# Patient Record
Sex: Female | Born: 1955 | Race: White | Hispanic: No | Marital: Married | Smoking: Never smoker
Health system: Southern US, Community
[De-identification: ages and names within clinical notes are randomized; demographics above are authoritative.]

## PROBLEM LIST (undated history)

## (undated) DIAGNOSIS — K649 Unspecified hemorrhoids: Secondary | ICD-10-CM

## (undated) DIAGNOSIS — N189 Chronic kidney disease, unspecified: Secondary | ICD-10-CM

## (undated) HISTORY — DX: Unspecified hemorrhoids: K64.9

## (undated) HISTORY — PX: SALIVARY GLAND SURGERY: SHX768

## (undated) HISTORY — PX: TONSILLECTOMY AND ADENOIDECTOMY: SUR1326

## (undated) HISTORY — PX: ADENOIDECTOMY: SUR15

## (undated) HISTORY — DX: Chronic kidney disease, unspecified: N18.9

## (undated) HISTORY — PX: CHOLECYSTECTOMY: SHX55

## (undated) HISTORY — PX: LAPAROSCOPY: SHX197

---

## 2005-10-14 LAB — CONVERTED CEMR LAB
AST: 17 units/L
Albumin: 4.6 g/dL
Alkaline Phosphatase: 60 units/L
Anti Nuclear Antibody(ANA): NEGATIVE
BUN: 11 mg/dL
CO2: 25 meq/L
Calcium: 9.8 mg/dL
Chloride: 106 meq/L
Glucose, Bld: 90 mg/dL
HCT: 41.1 %
Hemoglobin: 13.9 g/dL
MCV: 92.6 fL
Platelets: 257 10*3/uL
Potassium: 5.2 meq/L
Rheumatoid fact SerPl-aCnc: 6 intl units/mL
Sodium: 141 meq/L
Total Bilirubin: 0.8 mg/dL
Total Protein: 7.2 g/dL
WBC, blood: 7.7 10*3/uL

## 2005-11-20 LAB — CONVERTED CEMR LAB: Pap Smear: NORMAL

## 2006-11-06 ENCOUNTER — Encounter: Payer: Self-pay | Admitting: Family Medicine

## 2006-11-07 ENCOUNTER — Ambulatory Visit: Payer: Self-pay | Admitting: Family Medicine

## 2006-11-07 DIAGNOSIS — M5412 Radiculopathy, cervical region: Secondary | ICD-10-CM | POA: Insufficient documentation

## 2006-11-12 ENCOUNTER — Encounter: Payer: Self-pay | Admitting: Family Medicine

## 2006-12-06 ENCOUNTER — Other Ambulatory Visit: Admission: RE | Admit: 2006-12-06 | Discharge: 2006-12-06 | Payer: Self-pay | Admitting: Family Medicine

## 2006-12-06 ENCOUNTER — Encounter: Payer: Self-pay | Admitting: Family Medicine

## 2006-12-06 ENCOUNTER — Ambulatory Visit: Payer: Self-pay | Admitting: Family Medicine

## 2006-12-11 ENCOUNTER — Ambulatory Visit: Payer: Self-pay | Admitting: Family Medicine

## 2006-12-11 DIAGNOSIS — D485 Neoplasm of uncertain behavior of skin: Secondary | ICD-10-CM

## 2006-12-12 ENCOUNTER — Telehealth (INDEPENDENT_AMBULATORY_CARE_PROVIDER_SITE_OTHER): Payer: Self-pay | Admitting: *Deleted

## 2006-12-12 ENCOUNTER — Encounter: Payer: Self-pay | Admitting: Family Medicine

## 2006-12-12 LAB — CONVERTED CEMR LAB
AST: 18 units/L (ref 0–37)
Albumin: 4.4 g/dL (ref 3.5–5.2)
Alkaline Phosphatase: 65 units/L (ref 39–117)
CO2: 26 meq/L (ref 19–32)
Chloride: 108 meq/L (ref 96–112)
Cholesterol: 186 mg/dL (ref 0–200)
Creatinine, Ser: 0.79 mg/dL (ref 0.40–1.20)
Glucose, Bld: 95 mg/dL (ref 70–99)
Potassium: 4.5 meq/L (ref 3.5–5.3)
Total Bilirubin: 0.4 mg/dL (ref 0.3–1.2)
Total CHOL/HDL Ratio: 2.2
Total Protein: 7.4 g/dL (ref 6.0–8.3)
Triglycerides: 36 mg/dL (ref <150)
VLDL: 7 mg/dL (ref 0–40)

## 2006-12-14 ENCOUNTER — Telehealth (INDEPENDENT_AMBULATORY_CARE_PROVIDER_SITE_OTHER): Payer: Self-pay | Admitting: *Deleted

## 2006-12-17 ENCOUNTER — Telehealth: Payer: Self-pay | Admitting: Family Medicine

## 2006-12-24 ENCOUNTER — Encounter: Payer: Self-pay | Admitting: Family Medicine

## 2007-02-02 ENCOUNTER — Encounter: Payer: Self-pay | Admitting: Family Medicine

## 2007-03-20 ENCOUNTER — Telehealth (INDEPENDENT_AMBULATORY_CARE_PROVIDER_SITE_OTHER): Payer: Self-pay | Admitting: *Deleted

## 2007-09-09 ENCOUNTER — Ambulatory Visit: Payer: Self-pay | Admitting: Family Medicine

## 2007-09-09 ENCOUNTER — Encounter: Admission: RE | Admit: 2007-09-09 | Discharge: 2007-09-09 | Payer: Self-pay | Admitting: Family Medicine

## 2007-09-09 DIAGNOSIS — B079 Viral wart, unspecified: Secondary | ICD-10-CM | POA: Insufficient documentation

## 2007-09-09 DIAGNOSIS — M4802 Spinal stenosis, cervical region: Secondary | ICD-10-CM | POA: Insufficient documentation

## 2007-09-18 ENCOUNTER — Encounter: Admission: RE | Admit: 2007-09-18 | Discharge: 2007-10-30 | Payer: Self-pay | Admitting: Family Medicine

## 2007-09-18 ENCOUNTER — Encounter: Payer: Self-pay | Admitting: Family Medicine

## 2007-11-12 ENCOUNTER — Ambulatory Visit: Payer: Self-pay | Admitting: Family Medicine

## 2007-11-12 DIAGNOSIS — J019 Acute sinusitis, unspecified: Secondary | ICD-10-CM

## 2007-11-12 DIAGNOSIS — J309 Allergic rhinitis, unspecified: Secondary | ICD-10-CM | POA: Insufficient documentation

## 2007-12-06 ENCOUNTER — Ambulatory Visit: Payer: Self-pay | Admitting: Family Medicine

## 2007-12-06 ENCOUNTER — Other Ambulatory Visit: Admission: RE | Admit: 2007-12-06 | Discharge: 2007-12-06 | Payer: Self-pay | Admitting: Family Medicine

## 2007-12-06 ENCOUNTER — Encounter: Admission: RE | Admit: 2007-12-06 | Discharge: 2007-12-06 | Payer: Self-pay | Admitting: Family Medicine

## 2007-12-06 ENCOUNTER — Encounter: Payer: Self-pay | Admitting: Family Medicine

## 2007-12-06 DIAGNOSIS — N951 Menopausal and female climacteric states: Secondary | ICD-10-CM

## 2007-12-06 DIAGNOSIS — R5381 Other malaise: Secondary | ICD-10-CM

## 2007-12-06 DIAGNOSIS — R5383 Other fatigue: Secondary | ICD-10-CM

## 2007-12-06 LAB — CONVERTED CEMR LAB
ALT: 14 units/L (ref 0–35)
AST: 19 units/L (ref 0–37)
Albumin: 4.5 g/dL (ref 3.5–5.2)
Alkaline Phosphatase: 61 units/L (ref 39–117)
BUN: 9 mg/dL (ref 6–23)
CO2: 27 meq/L (ref 19–32)
Calcium: 9.7 mg/dL (ref 8.4–10.5)
Chloride: 107 meq/L (ref 96–112)
Cholesterol: 184 mg/dL (ref 0–200)
Creatinine, Ser: 0.75 mg/dL (ref 0.40–1.20)
Glucose, Bld: 87 mg/dL (ref 70–99)
HCT: 44.3 % (ref 36.0–46.0)
HDL: 75 mg/dL (ref >39)
Hemoglobin: 14.3 g/dL (ref 12.0–15.0)
LDL Cholesterol: 97 mg/dL (ref 0–99)
MCHC: 32.3 g/dL (ref 30.0–36.0)
MCV: 95.9 fL (ref 78.0–100.0)
Platelets: 233 10*3/uL (ref 150–400)
Potassium: 5.3 meq/L (ref 3.5–5.3)
RBC: 4.62 M/uL (ref 3.87–5.11)
RDW: 13.2 % (ref 11.5–15.5)
Sodium: 144 meq/L (ref 135–145)
TSH: 2.309 microintl units/mL (ref 0.350–5.50)
Total Bilirubin: 0.5 mg/dL (ref 0.3–1.2)
Total CHOL/HDL Ratio: 2.5
Total Protein: 7.5 g/dL (ref 6.0–8.3)
Triglycerides: 59 mg/dL (ref <150)
VLDL: 12 mg/dL (ref 0–40)
WBC: 7.2 10*3/uL (ref 4.0–10.5)

## 2007-12-09 ENCOUNTER — Encounter: Payer: Self-pay | Admitting: Family Medicine

## 2007-12-10 ENCOUNTER — Encounter: Payer: Self-pay | Admitting: Family Medicine

## 2007-12-10 LAB — CONVERTED CEMR LAB: Pap Smear: NORMAL

## 2007-12-26 ENCOUNTER — Encounter: Admission: RE | Admit: 2007-12-26 | Discharge: 2007-12-26 | Payer: Self-pay | Admitting: Family Medicine

## 2008-01-09 ENCOUNTER — Ambulatory Visit: Payer: Self-pay | Admitting: Family Medicine

## 2008-01-09 DIAGNOSIS — D239 Other benign neoplasm of skin, unspecified: Secondary | ICD-10-CM | POA: Insufficient documentation

## 2008-01-10 ENCOUNTER — Encounter: Payer: Self-pay | Admitting: Family Medicine

## 2008-01-16 ENCOUNTER — Telehealth: Payer: Self-pay | Admitting: Family Medicine

## 2008-01-16 ENCOUNTER — Ambulatory Visit: Payer: Self-pay | Admitting: Family Medicine

## 2008-01-16 DIAGNOSIS — N3000 Acute cystitis without hematuria: Secondary | ICD-10-CM | POA: Insufficient documentation

## 2008-01-16 LAB — CONVERTED CEMR LAB
Bilirubin Urine: NEGATIVE
Blood in Urine, dipstick: NEGATIVE
Nitrite: NEGATIVE
Protein, U semiquant: 30
Specific Gravity, Urine: 1.015

## 2008-01-17 ENCOUNTER — Encounter: Payer: Self-pay | Admitting: Family Medicine

## 2008-01-23 ENCOUNTER — Ambulatory Visit: Payer: Self-pay | Admitting: Family Medicine

## 2008-01-23 LAB — CONVERTED CEMR LAB
Bilirubin Urine: NEGATIVE
Blood in Urine, dipstick: NEGATIVE
Glucose, Urine, Semiquant: NEGATIVE
Ketones, urine, test strip: NEGATIVE
Nitrite: NEGATIVE
Protein, U semiquant: NEGATIVE
Specific Gravity, Urine: <1.005
Urobilinogen, UA: 0.2
WBC Urine, dipstick: NEGATIVE
pH: 6.5

## 2008-07-24 ENCOUNTER — Ambulatory Visit: Payer: Self-pay | Admitting: Family Medicine

## 2008-07-24 DIAGNOSIS — K115 Sialolithiasis: Secondary | ICD-10-CM | POA: Insufficient documentation

## 2008-08-11 ENCOUNTER — Encounter: Payer: Self-pay | Admitting: Family Medicine

## 2008-08-12 ENCOUNTER — Ambulatory Visit: Payer: Self-pay | Admitting: Family Medicine

## 2008-08-12 DIAGNOSIS — L821 Other seborrheic keratosis: Secondary | ICD-10-CM

## 2008-10-27 ENCOUNTER — Ambulatory Visit: Payer: Self-pay | Admitting: Family Medicine

## 2008-12-11 ENCOUNTER — Ambulatory Visit: Payer: Self-pay | Admitting: Family Medicine

## 2008-12-11 ENCOUNTER — Encounter: Payer: Self-pay | Admitting: Family Medicine

## 2008-12-11 ENCOUNTER — Other Ambulatory Visit: Admission: RE | Admit: 2008-12-11 | Discharge: 2008-12-11 | Payer: Self-pay | Admitting: Family Medicine

## 2008-12-14 ENCOUNTER — Encounter: Payer: Self-pay | Admitting: Family Medicine

## 2008-12-14 LAB — CONVERTED CEMR LAB
ALT: 20 units/L (ref 0–35)
AST: 20 units/L (ref 0–37)
Albumin: 4.4 g/dL (ref 3.5–5.2)
BUN: 13 mg/dL (ref 6–23)
Basophils Absolute: 0.1 10*3/uL (ref 0.0–0.1)
Basophils Relative: 1 % (ref 0–1)
CO2: 26 meq/L (ref 19–32)
Cholesterol: 167 mg/dL (ref 0–200)
Creatinine, Ser: 0.69 mg/dL (ref 0.40–1.20)
Eosinophils Absolute: 0.3 10*3/uL (ref 0.0–0.7)
Eosinophils Relative: 3 % (ref 0–5)
Glucose, Bld: 96 mg/dL (ref 70–99)
HCT: 42.5 % (ref 36.0–46.0)
HDL: 72 mg/dL (ref >39)
Hemoglobin: 14.2 g/dL (ref 12.0–15.0)
LDL Cholesterol: 83 mg/dL (ref 0–99)
Lymphocytes Relative: 24 % (ref 12–46)
Lymphs Abs: 2 10*3/uL (ref 0.7–4.0)
MCHC: 33.4 g/dL (ref 30.0–36.0)
MCV: 93.2 fL (ref 78.0–100.0)
Monocytes Absolute: 0.7 10*3/uL (ref 0.1–1.0)
Monocytes Relative: 8 % (ref 3–12)
Neutro Abs: 5.1 10*3/uL (ref 1.7–7.7)
Neutrophils Relative %: 63 % (ref 43–77)
Platelets: 266 10*3/uL (ref 150–400)
RBC: 4.56 M/uL (ref 3.87–5.11)
RDW: 13.5 % (ref 11.5–15.5)
Sodium: 143 meq/L (ref 135–145)
TSH: 1.671 microintl units/mL (ref 0.350–4.500)
Total Bilirubin: 0.5 mg/dL (ref 0.3–1.2)
Total CHOL/HDL Ratio: 2.3
Total Protein: 7.2 g/dL (ref 6.0–8.3)
VLDL: 12 mg/dL (ref 0–40)
WBC: 8.1 10*3/uL (ref 4.0–10.5)

## 2009-01-25 ENCOUNTER — Encounter: Admission: RE | Admit: 2009-01-25 | Discharge: 2009-01-25 | Payer: Self-pay | Admitting: Family Medicine

## 2009-03-31 ENCOUNTER — Ambulatory Visit: Payer: Self-pay | Admitting: Family Medicine

## 2009-03-31 DIAGNOSIS — L570 Actinic keratosis: Secondary | ICD-10-CM

## 2009-04-18 ENCOUNTER — Encounter: Payer: Self-pay | Admitting: Family Medicine

## 2009-04-21 ENCOUNTER — Encounter: Payer: Self-pay | Admitting: Family Medicine

## 2009-04-28 ENCOUNTER — Encounter: Payer: Self-pay | Admitting: Family Medicine

## 2009-05-19 ENCOUNTER — Encounter: Admission: RE | Admit: 2009-05-19 | Discharge: 2009-05-19 | Payer: Self-pay

## 2009-07-13 ENCOUNTER — Ambulatory Visit: Payer: Self-pay | Admitting: Family Medicine

## 2009-07-13 DIAGNOSIS — H9319 Tinnitus, unspecified ear: Secondary | ICD-10-CM | POA: Insufficient documentation

## 2009-07-13 DIAGNOSIS — H9209 Otalgia, unspecified ear: Secondary | ICD-10-CM | POA: Insufficient documentation

## 2009-07-22 ENCOUNTER — Encounter: Payer: Self-pay | Admitting: Family Medicine

## 2009-09-28 ENCOUNTER — Ambulatory Visit: Payer: Self-pay | Admitting: Family Medicine

## 2009-09-28 DIAGNOSIS — M2550 Pain in unspecified joint: Secondary | ICD-10-CM | POA: Insufficient documentation

## 2009-09-28 LAB — CONVERTED CEMR LAB
Bilirubin Urine: NEGATIVE
Blood in Urine, dipstick: NEGATIVE
Glucose, Urine, Semiquant: NEGATIVE
Heterophile Ab Screen: NEGATIVE
Inflenza A Ag: NEGATIVE
Influenza B Ag: NEGATIVE
Nitrite: NEGATIVE
Specific Gravity, Urine: 1.015
Urobilinogen, UA: 0.2
WBC Urine, dipstick: NEGATIVE
pH: 7

## 2009-09-29 ENCOUNTER — Ambulatory Visit: Payer: Self-pay | Admitting: Family Medicine

## 2009-09-29 ENCOUNTER — Encounter (INDEPENDENT_AMBULATORY_CARE_PROVIDER_SITE_OTHER): Payer: Self-pay | Admitting: *Deleted

## 2009-09-29 ENCOUNTER — Encounter: Admission: RE | Admit: 2009-09-29 | Discharge: 2009-09-29 | Payer: Self-pay | Admitting: Family Medicine

## 2009-09-29 LAB — CONVERTED CEMR LAB
Basophils Absolute: 0.1 10*3/uL (ref 0.0–0.1)
Basophils Relative: 0 % (ref 0–1)
Eosinophils Absolute: 1.1 10*3/uL — ABNORMAL HIGH (ref 0.0–0.7)
Eosinophils Relative: 7 % — ABNORMAL HIGH (ref 0–5)
Glucose, Urine, Semiquant: NEGATIVE
HCT: 41.7 % (ref 36.0–46.0)
Hemoglobin: 13.6 g/dL (ref 12.0–15.0)
Ketones, urine, test strip: NEGATIVE
Lymphocytes Relative: 11 % — ABNORMAL LOW (ref 12–46)
Lymphs Abs: 1.6 10*3/uL (ref 0.7–4.0)
MCHC: 32.6 g/dL (ref 30.0–36.0)
MCV: 94.6 fL (ref 78.0–100.0)
Monocytes Absolute: 0.8 10*3/uL (ref 0.1–1.0)
Monocytes Relative: 6 % (ref 3–12)
Neutro Abs: 11.1 10*3/uL — ABNORMAL HIGH (ref 1.7–7.7)
Neutrophils Relative %: 76 % (ref 43–77)
Nitrite: NEGATIVE
Platelets: 304 10*3/uL (ref 150–400)
Protein, U semiquant: NEGATIVE
RBC: 4.41 M/uL (ref 3.87–5.11)
RDW: 13.5 % (ref 11.5–15.5)
Sed Rate: 16 mm/hr (ref 0–22)
Specific Gravity, Urine: 1.015
Urobilinogen, UA: 0.2
WBC: 14.6 10*3/uL — ABNORMAL HIGH (ref 4.0–10.5)
pH: 7

## 2009-09-30 ENCOUNTER — Telehealth: Payer: Self-pay | Admitting: Family Medicine

## 2009-10-22 ENCOUNTER — Telehealth: Payer: Self-pay | Admitting: Family Medicine

## 2009-10-22 DIAGNOSIS — M79609 Pain in unspecified limb: Secondary | ICD-10-CM | POA: Insufficient documentation

## 2009-12-02 ENCOUNTER — Ambulatory Visit: Payer: Self-pay | Admitting: Family Medicine

## 2009-12-15 ENCOUNTER — Ambulatory Visit: Payer: Self-pay | Admitting: Family Medicine

## 2009-12-15 DIAGNOSIS — N959 Unspecified menopausal and perimenopausal disorder: Secondary | ICD-10-CM | POA: Insufficient documentation

## 2009-12-15 DIAGNOSIS — R143 Flatulence: Secondary | ICD-10-CM

## 2009-12-15 DIAGNOSIS — R142 Eructation: Secondary | ICD-10-CM

## 2009-12-15 DIAGNOSIS — R141 Gas pain: Secondary | ICD-10-CM

## 2009-12-15 LAB — CONVERTED CEMR LAB
ALT: 21 units/L (ref 0–35)
AST: 22 units/L (ref 0–37)
Alkaline Phosphatase: 65 units/L (ref 39–117)
BUN: 14 mg/dL (ref 6–23)
Basophils Absolute: 0.1 10*3/uL (ref 0.0–0.1)
Basophils Relative: 1 % (ref 0–1)
CO2: 26 meq/L (ref 19–32)
Chloride: 106 meq/L (ref 96–112)
Cholesterol: 186 mg/dL (ref 0–200)
Creatinine, Ser: 0.75 mg/dL (ref 0.40–1.20)
Eosinophils Absolute: 0.1 10*3/uL (ref 0.0–0.7)
Eosinophils Relative: 2 % (ref 0–5)
Glucose, Bld: 85 mg/dL (ref 70–99)
HCT: 43.3 % (ref 36.0–46.0)
HDL: 76 mg/dL (ref >39)
LDL Cholesterol: 97 mg/dL (ref 0–99)
Lymphs Abs: 1.7 10*3/uL (ref 0.7–4.0)
MCHC: 32.3 g/dL (ref 30.0–36.0)
MCV: 93.5 fL (ref 78.0–100.0)
Monocytes Relative: 7 % (ref 3–12)
Neutrophils Relative %: 69 % (ref 43–77)
Platelets: 290 10*3/uL (ref 150–400)
Potassium: 4.4 meq/L (ref 3.5–5.3)
Progesterone: <0.2 ng/mL
RBC: 4.63 M/uL (ref 3.87–5.11)
Sodium: 142 meq/L (ref 135–145)
TSH: 2.233 microintl units/mL (ref 0.350–4.500)
Tissue Transglutaminase Ab, IgA: 3.6 units (ref <20)
Total Bilirubin: 0.6 mg/dL (ref 0.3–1.2)
Total CHOL/HDL Ratio: 2.4
Triglycerides: 67 mg/dL (ref <150)
VLDL: 13 mg/dL (ref 0–40)
WBC: 7.7 10*3/uL (ref 4.0–10.5)

## 2009-12-21 ENCOUNTER — Telehealth: Payer: Self-pay | Admitting: Family Medicine

## 2010-03-21 ENCOUNTER — Ambulatory Visit: Payer: Self-pay | Admitting: Family Medicine

## 2010-03-21 DIAGNOSIS — R300 Dysuria: Secondary | ICD-10-CM

## 2010-03-21 LAB — CONVERTED CEMR LAB
Bilirubin Urine: NEGATIVE
Blood in Urine, dipstick: NEGATIVE
Glucose, Urine, Semiquant: NEGATIVE
Nitrite: NEGATIVE
Protein, U semiquant: NEGATIVE
Specific Gravity, Urine: 1.015
Urobilinogen, UA: 0.2
WBC Urine, dipstick: NEGATIVE
pH: 7.5

## 2010-03-22 ENCOUNTER — Encounter: Payer: Self-pay | Admitting: Family Medicine

## 2010-03-28 ENCOUNTER — Telehealth: Payer: Self-pay | Admitting: Family Medicine

## 2010-04-20 ENCOUNTER — Ambulatory Visit: Payer: Self-pay | Admitting: Family Medicine

## 2010-04-20 DIAGNOSIS — R350 Frequency of micturition: Secondary | ICD-10-CM | POA: Insufficient documentation

## 2010-04-20 LAB — CONVERTED CEMR LAB
Bilirubin Urine: NEGATIVE
Blood in Urine, dipstick: NEGATIVE
Glucose, Urine, Semiquant: NEGATIVE
Ketones, urine, test strip: NEGATIVE
Nitrite: NEGATIVE
Protein, U semiquant: NEGATIVE
Specific Gravity, Urine: 1.01
Urobilinogen, UA: 0.2
WBC Urine, dipstick: NEGATIVE
pH: 5.5

## 2010-04-21 ENCOUNTER — Encounter (INDEPENDENT_AMBULATORY_CARE_PROVIDER_SITE_OTHER): Payer: Self-pay | Admitting: *Deleted

## 2010-04-22 ENCOUNTER — Encounter: Payer: Self-pay | Admitting: Family Medicine

## 2010-04-22 LAB — CONVERTED CEMR LAB
Casts: NONE SEEN /lpf
Crystals: NONE SEEN
RBC / HPF: NONE SEEN (ref <3)

## 2010-05-09 ENCOUNTER — Encounter: Payer: Self-pay | Admitting: Family Medicine

## 2010-05-23 ENCOUNTER — Encounter: Admission: RE | Admit: 2010-05-23 | Discharge: 2010-05-23 | Payer: Self-pay | Admitting: Family Medicine

## 2010-07-21 HISTORY — PX: BUNIONECTOMY: SHX129

## 2010-09-20 NOTE — Assessment & Plan Note (Signed)
Summary: CPE w/o pap   Vital Signs:  Patient profile:   55 year old female Menstrual status:  postmenopausal Height:      67.5 inches Weight:      143 pounds BMI:     22.15 O2 Sat:      100 % on Room air Pulse rate:   74 / minute BP sitting:   105 / 74  (left arm) Cuff size:   regular  Vitals Entered By: Payton Spark CMA (December 15, 2009 8:46 AM)  O2 Flow:  Room air CC: CPE w/ out pap   Primary Care Provider:  Seymour Bars DO  CC:  CPE w/ out pap.  History of Present Illness: 55 yo WF presents for CPE.  She had a normal pap last year.  She has been in menopause since 06.  She has just a few vasomotor flushes.  She is on Progesterone with DHEA thru [employer].  It has really helped her energy level.    She had been doing allergy shots but stopped them and she is still coughing.  She is using Zyrtec and Flonase but they do not seem to be helping.  She denies any vag bleeding, discharge or voiding concerns.    She had a normal colonoscopy in 06.  She had a Tdap in 05.  She is due for fasting labs.  Mammogram is due in June.      Current Medications (verified): 1)  Calcium-Magnesium 500-250 Mg Tabs (Calcium-Magnesium) .... Take 2 Tablet By Mouth Once A Day 2)  Flonase 50 Mcg/act Susp (Fluticasone Propionate) .... Two Sprays/nostril Daily 3)  Pro-Biotic Blend  Caps (Misc Intestinal Flora Regulat) .... Take 1 Tablet By Mouth Once A Day 4)  Allegra 180 Mg Tabs (Fexofenadine Hcl) .Marland Kitchen.. 1 Tab By Mouth Daily 5)  5-Htp Tryptophan 50 Mg Tabs (Nutritional Supplements) .... Take By Mouth Daily 6)  Super B-50 Complex Plus  Caps (Vitamins-Lipotropics) 7)  Potassium 99 Mg Tabs (Potassium) 8)  Cranberry 500 Mg Caps (Cranberry) 9)  Astragalus Root  Powd (Tragacanth)  Allergies (verified): 1)  ! Codeine 2)  ! Augmentin 3)  ! Adhesive Tape 4)  ! * Mold  Past History:  Past Medical History: Reviewed history from 12/11/2008 and no changes required. 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 D&C 2006 cholecystectomy 1998 salivary gland 1996, R side bunionectomy 1974  Family History: Reviewed history from 11/07/2006 and no changes required. mother died CLL, HTN, thyroid father thyroid, living. brother healthy  Social History: Reviewed history from 12/11/2008 and no changes required. Working at [employer].  Married to [place] and has 4 kids.  Lives with husband and 64 yo son Gala Romney).  Has twin girls.  Menopausal since 2006.  Never smoked.  1 ETOH per wk.  Yoga and walking 3-4 x a wk.  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  Review of Systems  The patient denies anorexia, fever, weight loss, weight gain, vision loss, decreased hearing, hoarseness, chest pain, syncope, dyspnea on exertion, peripheral edema, prolonged cough, headaches, hemoptysis, abdominal pain, melena, hematochezia, severe indigestion/heartburn, hematuria, incontinence, genital sores, muscle weakness, suspicious skin lesions, transient blindness, difficulty walking, depression, unusual weight change, abnormal bleeding, enlarged lymph nodes, angioedema, breast masses, and testicular masses.    Physical Exam  General:  alert, well-developed, well-nourished, and well-hydrated.   Head:  normocephalic and atraumatic.  Eyes:  pupils equal, pupils round, and pupils reactive to light.   Ears:  EACs patent; TMs translucent and gray with good cone of light and bony landmarks.  Nose:  no nasal discharge.   Mouth:  good dentition and pharynx pink and moist.  post nasal drip Neck:  no masses.   Breasts:  No mass, nodules, thickening, tenderness, bulging, retraction, inflamation, nipple discharge or skin changes noted.   Lungs:  Normal respiratory effort, chest expands symmetrically. Lungs are clear to auscultation, no crackles or wheezes. Heart:  Normal rate and  regular rhythm. S1 and S2 normal without gallop, murmur, click, rub or other extra sounds. Abdomen:  Bowel sounds positive,abdomen soft and non-tender without masses, organomegaly Pulses:  2+ radial and pedal pulses Extremities:  no E/C/C Neurologic:  gait normal and DTRs symmetrical and normal.   Skin:  color normal.  warty lesion over R index finger PIP joint Cervical Nodes:  No lymphadenopathy noted Psych:  good eye contact, not anxious appearing, and not depressed appearing.     Impression & Recommendations:  Problem # 1:  HEALTH MAINTENANCE EXAM (ICD-V70.0) Keeping healthy checklist for women reviewed. BP and BMI at goal Keep up the good work with healthy diet, exercise, MVI and calc/ D daily. DEXA and pap due next year. Tdap and colonosocpy UTD. Mammo due this summer. Update fasting labs today.  Complete Medication List: 1)  Calcium-magnesium 500-250 Mg Tabs (Calcium-magnesium) .... Take 2 tablet by mouth once a day 2)  Flonase 50 Mcg/act Susp (Fluticasone propionate) .... Two sprays/nostril daily 3)  Pro-biotic Blend Caps (Misc intestinal flora regulat) .... Take 1 tablet by mouth once a day 4)  Allegra 180 Mg Tabs (Fexofenadine hcl) .Marland Kitchen.. 1 tab by mouth daily 5)  5-htp Tryptophan 50 Mg Tabs (Nutritional supplements) .... Take by mouth daily 6)  Super B-50 Complex Plus Caps (Vitamins-lipotropics) 7)  Potassium 99 Mg Tabs (Potassium) 8)  Cranberry 500 Mg Caps (Cranberry) 9)  Astragalus Root Powd (Tragacanth) 10)  Omnaris 50 Mcg/act Susp (Ciclesonide) .... 2 sprays/ nostril daily  Other Orders: T-CBC w/Diff ([id number]) T-Comprehensive Metabolic Panel [id number]) T-Lipid Profile ([id number]) T-TSH [id number]) T-Progesterone (28413) T-Celiac Disease Ab Evaluation (8002)  Patient Instructions: 1)  Fasting labs today. 2)  will call you w/ results on Friday. 3)  Return for follow up in 4 months and will plan to schedule bunion surgery for the  Winter. Prescriptions: OMNARIS 50 MCG/ACT SUSP (CICLESONIDE) 2 sprays/ nostril daily  #1 x 2   Entered and Authorized by:   Seymour Bars DO   Signed by:   Seymour Bars DO on 12/15/2009   Method used:   Electronically to        [facility]  [id number] (retail)       [address]       [place], [place]  [address]       Ph: [phone]       Fax: [phone]   RxID:   [id number]

## 2010-09-20 NOTE — Progress Notes (Signed)
Summary: drug plan requires trying preferred drug first   Phone Note Outgoing Call   Call placed by: marj Call placed to: [facility] Summary of Call: Omnaris denied- Health plan requires she try fluticasone flunisolide or nasonex first  Initial call taken by: Roselle Locus,  Dec 21, 2009 2:17 PM    New/Updated Medications: NASONEX 50 MCG/ACT SUSP (MOMETASONE FUROATE) 2 sprays per nostril daily Prescriptions: NASONEX 50 MCG/ACT SUSP (MOMETASONE FUROATE) 2 sprays per nostril daily  #1 bottle x 1   Entered and Authorized by:   Seymour Bars DO   Signed by:   Seymour Bars DO on 12/21/2009   Method used:   Electronically to        [facility]  [id number] (retail)       [address]       [place], [place]  [address]       Ph: [phone]       Fax: [phone]   RxID:   [id number]

## 2010-09-20 NOTE — Consult Note (Signed)
Summary: [facility]  [facility]   Imported By: Lanelle Bal 05/18/2010 14:09:33  _____________________________________________________________________  External Attachment:    Type:   Image     Comment:   External Document

## 2010-09-20 NOTE — Progress Notes (Signed)
Summary: ?Cyst/referral  Phone Note Call from Patient   Caller: Patient  Summary of Call: Pt states she has developed a ? cyst on the top of her index finder x 2 weeks. She states that it is not painful but bothers her. Pt requests going to same hand surgeon as husband bc her ? cyst is very similar to the one her husband had. Please advise.  Initial call taken by: Payton Spark CMA,  October 22, 2009 9:20 AM  Follow-up for Phone Call        put in.   Follow-up by: Seymour Bars DO,  October 22, 2009 9:25 AM  New Problems: FINGER PAIN (ICD-729.5)   New Problems: FINGER PAIN (ICD-729.5)

## 2010-09-20 NOTE — Letter (Signed)
Summary: Primary Care Consult Scheduled Letter  [facility]  [address], [address]   [place], [place] [address]   Phone: [phone]  Fax: [phone]      04/21/2010 MRN: [id number]  ALAZAY LEICHT [address] [place], [place]  [address]    Dear Ms. Suto,     We have scheduled an appointment for you.  At the recommendation of Dr.Bowen, we have scheduled you a consult with [facility]imb[facility] on 05/09/10 at 8:45.  Their address is [address] [place] [address] ( [address] ). The office phone number is [phone].  If this appointment day and time is not convenient for you, please feel free to call the office of the doctor you are being referred to at the number listed above and reschedule the appointment.     It is important for you to keep your scheduled appointments. We are here to make sure you are given good patient care.    Thank you, Victorino Dike [phone], press 1 Patient Care Coordinator [facility]

## 2010-09-20 NOTE — Letter (Signed)
Summary: Out of Work  [facility]  [address], [address]   [place], [place] [address]   Phone: [phone]  Fax: [phone]    September 28, 2009   Employee:  PAM VANALSTINE    To Whom It May Concern:   For Medical reasons, please excuse the above named employee from work for the following dates:  Start:   Feb 8th - 9th  End:   Feb 10th  If you need additional information, please feel free to contact our office.         Sincerely,    Seymour Bars DO

## 2010-09-20 NOTE — Assessment & Plan Note (Signed)
Summary: sinusitis   Vital Signs:  Patient profile:   55 year old female Menstrual status:  postmenopausal Height:      67.5 inches Weight:      143 pounds BMI:     22.15 O2 Sat:      98 % on Room air Temp:     99.3 degrees F oral Pulse rate:   74 / minute BP sitting:   120 / 84  (left arm) Cuff size:   regular  Vitals Entered By: Payton Spark CMA (December 02, 2009 2:19 PM)  O2 Flow:  Room air CC: Sinus congestion, ear ache and cough x 1.5 weeks.    Primary Care Provider:  Seymour Bars DO  CC:  Sinus congestion and ear ache and cough x 1.5 weeks. Marland Kitchen  History of Present Illness: 55 yo WF presents for feeling congested and chills 10 days ago.  She started to feel better until last night and started having sinus HA this AM.  She has a R maxillary and frontal HA.  She is taking zyrtec or allegra for allergies and flonase but it has not relieved her.  She is doing Mucinex and Delsym which helped a little bit.  She has not taken much today.    She has some rhinorrhea.  She has a hx of seasonal allergies.  She is off allergy shots since December b/c it wasn't helping prevent sinusitis for her.  She has about 3 sinusitis / year.      Current Medications (verified): 1)  Calcium-Magnesium 500-250 Mg Tabs (Calcium-Magnesium) .... Take 2 Tablet By Mouth Once A Day 2)  Flonase 50 Mcg/act Susp (Fluticasone Propionate) .... Two Sprays/nostril Daily 3)  Pro-Biotic Blend  Caps (Misc Intestinal Flora Regulat) .... Take 1 Tablet By Mouth Once A Day  Allergies (verified): 1)  ! Codeine 2)  ! Augmentin 3)  ! Adhesive Tape 4)  ! * Mold  Past History:  Past Medical History: Reviewed history from 12/11/2008 and no changes required. 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 Working at [employer].  Married to [place] and has 4 kids.  Lives with  husband and 61 yo son Gala Romney).  Has twin girls.  Menopausal since 2006.  Never smoked.  1 ETOH per wk.  Yoga and walking 3-4 x a wk.  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  Review of Systems      See HPI  Physical Exam  General:  alert, well-developed, well-nourished, and well-hydrated.   Head:  normocephalic and atraumatic.   R maxillary sinus TTP Eyes:  conjunctiva clear Ears:  EACs patent; TMs translucent and gray with good cone of light and bony landmarks.  Nose:  nasal congestion with clear rhinorrhea Mouth:  good dentition and pharynx pink and moist.   Neck:  no masses.   Lungs:  Normal respiratory effort, chest expands symmetrically. Lungs are clear to auscultation, no crackles or wheezes. dry cough Heart:  Normal rate and regular rhythm. S1 and S2 normal without gallop, murmur, click, rub or other extra sounds. Skin:  color normal.   Cervical Nodes:  No lymphadenopathy noted Psych:  good eye contact.     Impression & Recommendations:  Problem # 1:  SINUSITIS- ACUTE-NOS (ICD-461.9) Treat sinusitis with Clarithromycin, Advil Cold and Sinus daytime and Nyquil at night.  Call if not improved in 10 days. The following medications were  removed from the medication list:    Vantin 100 Mg Tabs (Cefpodoxime proxetil) .Marland Kitchen... 1 tab by mouth two times a day x 10 days Her updated medication list for this problem includes:    Flonase 50 Mcg/act Susp (Fluticasone propionate) .Marland Kitchen..Marland Kitchen Two sprays/nostril daily    Clarithromycin 500 Mg Tabs (Clarithromycin) .Marland Kitchen... 1 tab by mouth two times a day x 10 days  Problem # 2:  ALLERGIC RHINITIS (ICD-477.9)  Her updated medication list for this problem includes:    Flonase 50 Mcg/act Susp (Fluticasone propionate) .Marland Kitchen..Marland Kitchen Two sprays/nostril daily    Allegra 180 Mg Tabs (Fexofenadine hcl) .Marland Kitchen... 1 tab by mouth daily  Complete Medication List: 1)  Calcium-magnesium 500-250 Mg Tabs (Calcium-magnesium) .... Take 2 tablet by mouth once a day 2)  Flonase 50 Mcg/act  Susp (Fluticasone propionate) .... Two sprays/nostril daily 3)  Pro-biotic Blend Caps (Misc intestinal flora regulat) .... Take 1 tablet by mouth once a day 4)  Clarithromycin 500 Mg Tabs (Clarithromycin) .Marland Kitchen.. 1 tab by mouth two times a day x 10 days 5)  Allegra 180 Mg Tabs (Fexofenadine hcl) .Marland Kitchen.. 1 tab by mouth daily  Patient Instructions: 1)  For sinusitis, use Clarithromycin x 10 days. 2)  Take Allegra + Flonase for allergies thru Spring. 3)  Use Advil Cold and Sinus during the day and Nyquil at night for current symptoms. 4)  Call if not improved in 10 days. Prescriptions: FLONASE 50 MCG/ACT SUSP (FLUTICASONE PROPIONATE) two sprays/nostril daily  #1 x 2   Entered and Authorized by:   Seymour Bars DO   Signed by:   Seymour Bars DO on 12/02/2009   Method used:   Electronically to        [facility]  [id number] (retail)       [address]       [place], [place]  [address]       Ph: [phone]       Fax: [phone]   RxID:   [id number] CLARITHROMYCIN 500 MG TABS (CLARITHROMYCIN) 1 tab by mouth two times a day x 10 days  #20 x 0   Entered and Authorized by:   Seymour Bars DO   Signed by:   Seymour Bars DO on 12/02/2009   Method used:   Electronically to        [facility]  [id number] (retail)       [address]       [place], [place]  [address]       Ph: [phone]       Fax: [phone]   RxID:   [id number]

## 2010-09-20 NOTE — Progress Notes (Signed)
Summary: UTI  Phone Note Call from Patient   Caller: Patient Summary of Call: Pt states she started Keflex on Thursday and feels no better. Pt would like to know if she can use AZO w/ Keflex to help w/ burning. Please adivse. Initial call taken by: Payton Spark CMA,  March 28, 2010 10:01 AM  Follow-up for Phone Call        her culture was sensitive to Keflex, so this should work.  She can take AZO x 48 hrs.  If not resolved, pls call. Follow-up by: Seymour Bars DO,  March 28, 2010 10:10 AM     Appended Document: UTI Pt aware of the above

## 2010-09-20 NOTE — Assessment & Plan Note (Signed)
Summary: UA//mpm  Nurse Visit   Vitals Entered By: Payton Spark CMA (September 29, 2009 11:41 AM)  Allergies: 1)  ! Codeine 2)  ! Augmentin 3)  ! Adhesive Tape 4)  ! * Mold Laboratory Results   Urine Tests    Routine Urinalysis   Color: yellow Appearance: Hazy Glucose: negative   (Normal Range: Negative) Bilirubin: negative   (Normal Range: Negative) Ketone: negative   (Normal Range: Negative) Spec. Gravity: 1.015   (Normal Range: 1.003-1.035) Blood: trace-intact   (Normal Range: Negative) pH: 7.0   (Normal Range: 5.0-8.0) Protein: negative   (Normal Range: Negative) Urobilinogen: 0.2   (Normal Range: 0-1) Nitrite: negative   (Normal Range: Negative) Leukocyte Esterace: trace   (Normal Range: Negative)       Orders Added: 1)  UA Dipstick w/o Micro (automated)  [81003]

## 2010-09-20 NOTE — Progress Notes (Signed)
Summary: Sinus infection  Phone Note Call from Patient   Caller: Patient Summary of Call: Pt calls stating she has a sinus infection. She gets them often and she an allergist occ for them. She c/o facial pressure and eye strain. Please advise. Initial call taken by: Payton Spark CMA,  September 30, 2009 12:30 PM  Follow-up for Phone Call        With her WBC count being up I will go ahead and put her on a round of antibiotics. Follow-up by: Seymour Bars DO,  September 30, 2009 12:32 PM    New/Updated Medications: VANTIN 100 MG TABS (CEFPODOXIME PROXETIL) 1 tab by mouth two times a day x 10 days Prescriptions: VANTIN 100 MG TABS (CEFPODOXIME PROXETIL) 1 tab by mouth two times a day x 10 days  #20 x 0   Entered and Authorized by:   Seymour Bars DO   Signed by:   Seymour Bars DO on 09/30/2009   Method used:   Electronically to        [facility]  [id number] (retail)       [address]       [place], [place]  [address]       Ph: [phone]       Fax: [phone]   RxID:   [id number]   Appended Document: Sinus infection Pt aware

## 2010-09-20 NOTE — Letter (Signed)
Summary: Out of Work  [facility] [place] [facility]  [address]   [place], [place] [address]   Phone: [phone]  Fax: [phone]    September 29, 2009   Employee:  Lynn Lloyd    To Whom It May Concern:   For Medical reasons, please excuse the above named employee from work for the following dates:  Start:   09/29/09  End:   09/30/09  If you need additional information, please feel free to contact our office.         Sincerely,   Nani Gasser MD

## 2010-09-20 NOTE — Assessment & Plan Note (Signed)
Summary: urine check  Nurse Visit   Vital Signs:  Patient profile:   55 year old female Menstrual status:  postmenopausal Temp:     98.7 degrees F oral  Vitals Entered By: Payton Spark CMA (March 21, 2010 4:41 PM)  Allergies: 1)  ! Codeine 2)  ! Augmentin 3)  ! Adhesive Tape 4)  ! * Mold Laboratory Results   Urine Tests    Routine Urinalysis   Color: yellow Appearance: Clear Glucose: negative   (Normal Range: Negative) Bilirubin: negative   (Normal Range: Negative) Ketone: negative   (Normal Range: Negative) Spec. Gravity: 1.015   (Normal Range: 1.003-1.035) Blood: negative   (Normal Range: Negative) pH: 7.5   (Normal Range: 5.0-8.0) Protein: negative   (Normal Range: Negative) Urobilinogen: 0.2   (Normal Range: 0-1) Nitrite: negative   (Normal Range: Negative) Leukocyte Esterace: negative   (Normal Range: Negative)       Orders Added: 1)  UA Dipstick w/o Micro (automated)  [81003] 2)  T-Culture, Urine [[id number]]  Pt c/o frequency, urgency and burning w/ urination x 2 days.   Impression & Recommendations:  Problem # 1:  DYSURIA (ICD-788.1) UA is normal.  Will send for cx.  No abx unless cx comes back +. Orders: UA Dipstick w/o Micro (automated)  (81003) T-Culture, Urine ([id number])  Complete Medication List: 1)  Calcium-magnesium 500-250 Mg Tabs (Calcium-magnesium) .... Take 2 tablet by mouth once a day 2)  Flonase 50 Mcg/act Susp (Fluticasone propionate) .... Two sprays/nostril daily 3)  Pro-biotic Blend Caps (Misc intestinal flora regulat) .... Take 1 tablet by mouth once a day 4)  Allegra 180 Mg Tabs (Fexofenadine hcl) .Marland Kitchen.. 1 tab by mouth daily 5)  5-htp Tryptophan 50 Mg Tabs (Nutritional supplements) .... Take by mouth daily 6)  Super B-50 Complex Plus Caps (Vitamins-lipotropics) 7)  Potassium 99 Mg Tabs (Potassium) 8)  Cranberry 500 Mg Caps (Cranberry) 9)  Astragalus Root Powd (Tragacanth) 10)  Nasonex 50 Mcg/act Susp (Mometasone  furoate) .... 2 sprays per nostril daily   Appended Document: urine check Pt aware of the above

## 2010-09-20 NOTE — Assessment & Plan Note (Signed)
Summary: post viral illness   Vital Signs:  Patient profile:   55 year old female Menstrual status:  postmenopausal Height:      67.5 inches Weight:      150 pounds BMI:     23.23 O2 Sat:      100 % on Room air Temp:     98.5 degrees F oral Pulse rate:   80 / minute BP sitting:   98 / 68  (right arm) Cuff size:   regular  Vitals Entered By: Payton Spark CMA/april (September 28, 2009 11:23 AM)  O2 Flow:  Room air CC: c/o of body aches x 2 days   Primary Care Provider:  Seymour Bars DO  CC:  c/o of body aches x 2 days.  History of Present Illness: 55 yo WF presents for a GI bug that occured about 10 days ago.  She felt run down after that with knee aching.  2 nights ago, her legs ached to the point that she could not sleep.  She took Advil which helped.  Her TMJ hurts.  She took some Aleve and Tylenol yesterday which helped her sleep.  She has had a rash on her abdomen.  Her hands are swolen.  She denies CP or DOE.  She had a normal EKG 3 wks ago for life insurance.  She has HAs and sore throat and dry cough.  She is drinking water and gatorade. She is not coughng anything up.  She denies N/V/D.  She is very tired.      Current Medications (verified): 1)  Calcium-Magnesium 500-250 Mg Tabs (Calcium-Magnesium) .... Take 2 Tablet By Mouth Once A Day 2)  Flonase 50 Mcg/act Susp (Fluticasone Propionate) .... Two Sprays/nostril Daily 3)  Pro-Biotic Blend  Caps (Misc Intestinal Flora Regulat) .... Take 1 Tablet By Mouth Once A Day  Allergies (verified): 1)  ! Codeine 2)  ! Augmentin 3)  ! Adhesive Tape 4)  ! * Mold  Past History:  Past Medical History: Reviewed history from 12/11/2008 and no changes required. 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 D&C 2006 cholecystectomy 1998 salivary gland 1996, R  side bunionectomy 1974  Social History: Reviewed history from 12/11/2008 and no changes required. Working at [employer].  Married to [place] and has 4 kids.  Lives with husband and 29 yo son Gala Romney).  Has twin girls.  Menopausal since 2006.  Never smoked.  1 ETOH per wk.  Yoga and walking 3-4 x a wk.  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  Review of Systems      See HPI  Physical Exam  General:  alert and well-developed.   Head:  normocephalic and atraumatic.  sinuses NTTP Eyes:  conjunctiva clear; no scleral icterus Nose:  no rhinorrhea Mouth:  throat mildly injected Neck:  no masses.   Lungs:  Normal respiratory effort, chest expands symmetrically. Lungs are clear to auscultation, no crackles or wheezes. Heart:  Normal rate and regular rhythm. S1 and S2 normal without gallop, murmur, click, rub or other extra sounds. Msk:  no joint effusions Pulses:  2+ radial pulses Extremities:  no LE edema minimal hand edema Neurologic:  alert & oriented X3, cranial nerves II-XII intact, and gait normal.   Skin:  slight facial pallor.  faint maculopapular rash over abdomen and extremities Cervical Nodes:  No lymphadenopathy noted Psych:  flat  affect.     Impression & Recommendations:  Problem # 1:  PAIN IN JOINT, MULTIPLE SITES (ICD-719.49) Joint pain following an acute viral illness suggests autoimmune process. Will get labs today and start her on a Medrol Dose pack in addition to supportive care. Out of work today and tomorrow.  Rest, hydrate, Tylenol as needed. Orders: T-Sed Rate (Automated) [id number]) T-Antistreptolysin (ASO Titer) [phone] [phone])  Problem # 2:  FATIGUE, ACUTE (ICD-780.79) Occuring after acute viral illness.  Check labs today to look for anemia, high rest rate, high ASO titer. UA only + for small protein given her other symptom of mild edema.  Mono -.  Flu -.   F/U results on labs tomorrow. Orders: T-CBC w/Diff ([id number]) Fingerstick [id number]) Monospot (96295) Flu A+B  (87400) UA Dipstick w/o Micro (automated)  (81003)  Complete Medication List: 1)  Calcium-magnesium 500-250 Mg Tabs (Calcium-magnesium) .... Take 2 tablet by mouth once a day 2)  Flonase 50 Mcg/act Susp (Fluticasone propionate) .... Two sprays/nostril daily 3)  Pro-biotic Blend Caps (Misc intestinal flora regulat) .... Take 1 tablet by mouth once a day 4)  Medrol (pak) 4 Mg Tabs (Methylprednisolone) .... Take as directed  Patient Instructions: 1)  Labs today. 2)  Will call you w/ results tomorrow morning. 3)  Start on Medrol Dose Pack. 4)  Rest, clear fluids, Tylenol (no Ibuprofen or Aleve  while on steroid pack). 5)  Return for f/u in 10 days. Prescriptions: MEDROL (PAK) 4 MG TABS (METHYLPREDNISOLONE) take as directed  #1 pack x 0   Entered and Authorized by:   Seymour Bars DO   Signed by:   Seymour Bars DO on 09/28/2009   Method used:   Electronically to        [facility] [phone]* (retail)       [address]       [place], [place]  [address]       Ph: [phone]       Fax: [phone]   RxID:   [id number]   Laboratory Results   Urine Tests    Routine Urinalysis   Color: yellow Appearance: Clear Glucose: negative   (Normal Range: Negative) Bilirubin: negative   (Normal Range: Negative) Ketone: trace (5)   (Normal Range: Negative) Spec. Gravity: 1.015   (Normal Range: 1.003-1.035) Blood: negative   (Normal Range: Negative) pH: 7.0   (Normal Range: 5.0-8.0) Protein: trace   (Normal Range: Negative) Urobilinogen: 0.2   (Normal Range: 0-1) Nitrite: negative   (Normal Range: Negative) Leukocyte Esterace: negative   (Normal Range: Negative)     Blood Tests      Mono: negative   Other Tests  Influenza A: negative Influenza B: negative

## 2010-09-20 NOTE — Assessment & Plan Note (Signed)
Summary: urinary symptoms   Vital Signs:  Patient profile:   55 year old female Menstrual status:  postmenopausal Height:      67.5 inches Weight:      144 pounds BMI:     22.30 O2 Sat:      100 % on Room air Temp:     98.8 degrees F oral Pulse rate:   72 / minute BP sitting:   128 / 81  (left arm) Cuff size:   regular  Vitals Entered By: Payton Spark CMA (April 20, 2010 2:47 PM)  O2 Flow:  Room air CC: ? UTI x 2 weeks.   Primary Care Provider:  Seymour Bars DO  CC:  ? UTI x 2 weeks.Marland Kitchen  History of Present Illness: 55 yo WF presents for 2 wks of urethral spasm.  She denies any dysuria.  She has pelvic spasm after she voids.  She feels like she is not completely emptying her bladder.  She is having some bilat flank pain and some nausea but no vomitting or fevers.  She is voiding more frequently with some urgency.  She is having some constipation even on a gluten free diet.  She off all of her vitamins and hormones.    Her stools are narrow caliber. Had a normal colonoscopy 5 yrs ago.     Current Medications (verified): 1)  Calcium-Magnesium 500-250 Mg Tabs (Calcium-Magnesium) .... Take 2 Tablet By Mouth Once A Day 2)  Flonase 50 Mcg/act Susp (Fluticasone Propionate) .... Two Sprays/nostril Daily 3)  Pro-Biotic Blend  Caps (Misc Intestinal Flora Regulat) .... Take 1 Tablet By Mouth Once A Day 4)  Allegra 180 Mg Tabs (Fexofenadine Hcl) .Marland Kitchen.. 1 Tab By Mouth Daily 5)  5-Htp Tryptophan 50 Mg Tabs (Nutritional Supplements) .... Take By Mouth Daily 6)  Super B-50 Complex Plus  Caps (Vitamins-Lipotropics) 7)  Potassium 99 Mg Tabs (Potassium) 8)  Cranberry 500 Mg Caps (Cranberry) 9)  Astragalus Root  Powd (Tragacanth) 10)  Nasonex 50 Mcg/act Susp (Mometasone Furoate) .... 2 Sprays Per Nostril Daily  Allergies (verified): 1)  ! Codeine 2)  ! Augmentin 3)  ! Adhesive Tape 4)  ! 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 Working at [employer].  Married to [place] and has 4 kids.  Lives with husband and 38 yo son Gala Romney).  Has twin girls.  Menopausal since 2006.  Never smoked.  1 ETOH per wk.  Yoga and walking 3-4 x a wk.  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  Review of Systems      See HPI  Physical Exam  General:  alert, well-developed, well-nourished, and well-hydrated.   Head:  normocephalic and atraumatic.   Eyes:  sclera non icteric Mouth:  pharynx pink and moist.   Neck:  no masses.   Lungs:  Normal respiratory effort, chest expands symmetrically. Lungs are clear to auscultation, no crackles or wheezes. Heart:  Normal rate and regular rhythm. S1 and S2 normal without gallop, murmur, click, rub or other extra sounds. Abdomen:  no CVAT.  soft.  NT/ ND.  No HSM.  NABS.  slight suprapubic TTP with guarding Extremities:  no LE edema Skin:  color normal and no rashes.   Psych:  good eye contact, not anxious appearing, and not depressed appearing.     Impression &  Recommendations:  Problem # 1:  FREQUENCY, URINARY (ICD-788.41) UA is normal today.  Has recurrently had these symptoms with incomplete emptying.  She likely has some bladder prolapse, maybe OAB with less likely IC.  I am going to get her into Urology for further eval and tx.  She is taking home Vesicare 10 mg tab samples to try  -- can cut in 1/2 daily.   Orders: Urology Referral (Urology)  Problem # 2:  DYSURIA (ICD-788.1) UA normal.  No need for abx.  Consider IC as dx.   The following medications were removed from the medication list:    Keflex 500 Mg Caps (Cephalexin) .Marland Kitchen... 1 capsule by mouth three times a day x 7 days  Orders: UA Dipstick w/o Micro (automated)  (81003) T-Urine Microscopic ([id number])  Complete Medication List: 1)  Calcium-magnesium 500-250 Mg Tabs (Calcium-magnesium)  .... Take 2 tablet by mouth once a day 2)  Flonase 50 Mcg/act Susp (Fluticasone propionate) .... Two sprays/nostril daily 3)  Pro-biotic Blend Caps (Misc intestinal flora regulat) .... Take 1 tablet by mouth once a day 4)  Allegra 180 Mg Tabs (Fexofenadine hcl) .Marland Kitchen.. 1 tab by mouth daily 5)  5-htp Tryptophan 50 Mg Tabs (Nutritional supplements) .... Take by mouth daily 6)  Super B-50 Complex Plus Caps (Vitamins-lipotropics) 7)  Potassium 99 Mg Tabs (Potassium) 8)  Cranberry 500 Mg Caps (Cranberry) 9)  Astragalus Root Powd (Tragacanth) 10)  Nasonex 50 Mcg/act Susp (Mometasone furoate) .... 2 sprays per nostril daily  Patient Instructions: 1)  will get you into [facility] to look for bladder prolapse or interstitial cystitis. 2)  Trial of Vesicare 1/2 to 1 full tab once a day for overactive bladder.  Laboratory Results   Urine Tests    Routine Urinalysis   Color: yellow Appearance: Clear Glucose: negative   (Normal Range: Negative) Bilirubin: negative   (Normal Range: Negative) Ketone: negative   (Normal Range: Negative) Spec. Gravity: 1.010   (Normal Range: 1.003-1.035) Blood: negative   (Normal Range: Negative) pH: 5.5   (Normal Range: 5.0-8.0) Protein: negative   (Normal Range: Negative) Urobilinogen: 0.2   (Normal Range: 0-1) Nitrite: negative   (Normal Range: Negative) Leukocyte Esterace: negative   (Normal Range: Negative)

## 2010-10-03 ENCOUNTER — Other Ambulatory Visit: Payer: Self-pay | Admitting: Family Medicine

## 2010-10-03 ENCOUNTER — Encounter: Payer: Self-pay | Admitting: Family Medicine

## 2010-10-03 ENCOUNTER — Ambulatory Visit (INDEPENDENT_AMBULATORY_CARE_PROVIDER_SITE_OTHER): Payer: BC Managed Care – PPO | Admitting: Family Medicine

## 2010-10-03 ENCOUNTER — Ambulatory Visit
Admission: RE | Admit: 2010-10-03 | Discharge: 2010-10-03 | Disposition: A | Discharge status: Home / Self Care | Payer: BC Managed Care – PPO | Source: Ambulatory Visit | Attending: Family Medicine | Admitting: Family Medicine

## 2010-10-03 DIAGNOSIS — R102 Pelvic and perineal pain: Secondary | ICD-10-CM

## 2010-10-03 DIAGNOSIS — R109 Unspecified abdominal pain: Secondary | ICD-10-CM

## 2010-10-03 DIAGNOSIS — R110 Nausea: Secondary | ICD-10-CM

## 2010-10-03 LAB — CONVERTED CEMR LAB
Glucose, Urine, Semiquant: NEGATIVE
Nitrite: NEGATIVE
Protein, U semiquant: NEGATIVE
Urobilinogen, UA: 0.2
WBC Urine, dipstick: NEGATIVE
pH: 7

## 2010-10-04 ENCOUNTER — Encounter: Payer: Self-pay | Admitting: Family Medicine

## 2010-10-04 LAB — CONVERTED CEMR LAB
AST: 18 units/L (ref 0–37)
Alkaline Phosphatase: 59 units/L (ref 39–117)
BUN: 9 mg/dL (ref 6–23)
Basophils Relative: 1 % (ref 0–1)
Calcium: 9.3 mg/dL (ref 8.4–10.5)
Creatinine, Ser: 0.67 mg/dL (ref 0.40–1.20)
Eosinophils Absolute: 0.1 10*3/uL (ref 0.0–0.7)
Eosinophils Relative: 2 % (ref 0–5)
Glucose, Bld: 122 mg/dL — ABNORMAL HIGH (ref 70–99)
HCT: 41.4 % (ref 36.0–46.0)
Hemoglobin: 13.4 g/dL (ref 12.0–15.0)
Lymphocytes Relative: 30 % (ref 12–46)
Lymphs Abs: 2.4 10*3/uL (ref 0.7–4.0)
MCHC: 32.4 g/dL (ref 30.0–36.0)
MCV: 93.9 fL (ref 78.0–100.0)
Monocytes Relative: 8 % (ref 3–12)
Neutro Abs: 4.9 10*3/uL (ref 1.7–7.7)
Neutrophils Relative %: 61 % (ref 43–77)
Platelets: 256 10*3/uL (ref 150–400)
Potassium: 4.9 meq/L (ref 3.5–5.3)
RBC: 4.41 M/uL (ref 3.87–5.11)
RDW: 13.1 % (ref 11.5–15.5)
Sodium: 142 meq/L (ref 135–145)
Total Bilirubin: 0.3 mg/dL (ref 0.3–1.2)
Total Protein: 6.7 g/dL (ref 6.0–8.3)
WBC: 8.1 10*3/uL (ref 4.0–10.5)

## 2010-10-12 ENCOUNTER — Other Ambulatory Visit: Payer: Self-pay | Admitting: Obstetrics & Gynecology

## 2010-10-12 ENCOUNTER — Encounter (INDEPENDENT_AMBULATORY_CARE_PROVIDER_SITE_OTHER): Payer: BC Managed Care – PPO | Admitting: Obstetrics & Gynecology

## 2010-10-12 DIAGNOSIS — Z1272 Encounter for screening for malignant neoplasm of vagina: Secondary | ICD-10-CM

## 2010-10-12 DIAGNOSIS — Z01419 Encounter for gynecological examination (general) (routine) without abnormal findings: Secondary | ICD-10-CM

## 2010-10-12 NOTE — Letter (Signed)
Summary: Out of Work  [facility]  [address], [address]   [place], [place] [address]   Phone: [phone]  Fax: [phone]    October 03, 2010   Employee:  AMBERLY LIVAS    To Whom It May Concern:   For Medical reasons, please excuse the above named employee from work for the following dates:  Start:   Feb 13th - 14th  End:   Feb 15th  If you need additional information, please feel free to contact our office.         Sincerely,    Seymour Bars DO

## 2010-10-12 NOTE — Assessment & Plan Note (Signed)
Summary: R pelvic/ flank pain   Vital Signs:  Patient profile:   55 year old female Menstrual status:  postmenopausal Height:      67.5 inches Weight:      153 pounds BMI:     23.69 O2 Sat:      100 % on Room air Temp:     98.9 degrees F oral Pulse rate:   71 / minute BP sitting:   118 / 78  (left arm) Cuff size:   regular  Vitals Entered By: Payton Spark CMA (October 03, 2010 11:30 AM)  O2 Flow:  Room air CC: RLQ, groin and back pain x 2 weeks comes and goes but getting worse.   Primary Care Provider:  Seymour Bars DO  CC:  RLQ and groin and back pain x 2 weeks comes and goes but getting worse.Marland Kitchen  History of Present Illness: 55 yo WF presents for pain over the R  flank and into the groin which started 2 wks ago.  She c/o fatigue, malaise, mild nausea but no vomitting, blood in the stool or constipation.  Pain does not seem positional or worse with walking.  She did have a R bunionectomy 2 mos ago but does not feel like she is limping.  Denies hematuria, dysuria, urgency, fevers or incontinence.  The pain seems crampy in nature and comes and goes in waves.      Allergies: 1)  ! Codeine 2)  ! Augmentin 3)  ! Adhesive Tape 4)  ! * Mold  Past History:  Past Medical History: Reviewed history from 04/20/2010 and no changes required. 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 Working at [employer].  Married to [place] and has 4 kids.  Lives with husband and 71 yo son Gala Romney).  Has twin girls.  Menopausal since 2006.  Never smoked.  1 ETOH per wk.  Yoga and walking 3-4 x a wk.  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  Review of Systems      See HPI  Physical Exam  General:  alert,  well-developed, well-nourished, and well-hydrated.   Head:  normocephalic and atraumatic.   Eyes:  sclera non icteric Mouth:  pharynx pink and moist.   Neck:  no masses.   Lungs:  Normal respiratory effort, chest expands symmetrically. Lungs are clear to auscultation, no crackles or wheezes. Heart:  Normal rate and regular rhythm. S1 and S2 normal without gallop, murmur, click, rub or other extra sounds. Abdomen:  soft.  R pelvic TTP with vol guarding;   R CVAT; NABS.  ND.   Extremities:  no LE edema Skin:  color normal.  no pallor, diaphoresis or jaundice Cervical Nodes:  No lymphadenopathy noted Psych:  good eye contact and flat affect.     Impression & Recommendations:  Problem # 1:  PELVIC PAIN, RIGHT (ICD-789.09) DDX includes ovarian cyst, kidney stone, appendicitis, diverticulitis, viral syndrome, constipation.   Will get labs and a CT abd/ pelvis today.  F/U results this afternoon.  Hemodynamically stable and non toxic.  Out of work note given for today and tomorrow.  Hydrate, rest and use RX Odansetron and Ibuprofen as needed. Her updated medication list for this problem includes:    Ibuprofen 600 Mg Tabs (Ibuprofen) .Marland KitchenMarland KitchenMarland KitchenMarland Kitchen 1  tab by mouth three times a day with meals as needed for pain  Orders: UA Dipstick w/o Micro (automated)  (81003) T-CT Abdomen/pelvis w/o (95284) T-Comprehensive Metabolic Panel ([id number])  Problem # 2:  NAUSEA ALONE (ICD-787.02) As per #1.  UA normal.  Her updated medication list for this problem includes:    Allegra 180 Mg Tabs (Fexofenadine hcl) .Marland Kitchen... 1 tab by mouth daily    Ondansetron 8 Mg Tbdp (Ondansetron) .Marland Kitchen... 1 tab by mouth three times a day as needed nausea  Orders: T-Comprehensive Metabolic Panel ([id number])  Complete Medication List: 1)  Calcium-magnesium 500-250 Mg Tabs (Calcium-magnesium) .... Take 2 tablet by mouth once a day 2)  Flonase 50 Mcg/act Susp (Fluticasone propionate) .... Two sprays/nostril daily 3)  Pro-biotic Blend  Caps (Misc intestinal flora regulat) .... Take 1 tablet by mouth once a day 4)  Allegra 180 Mg Tabs (Fexofenadine hcl) .Marland Kitchen.. 1 tab by mouth daily 5)  5-htp Tryptophan 50 Mg Tabs (Nutritional supplements) .... Take by mouth daily 6)  Super B-50 Complex Plus Caps (Vitamins-lipotropics) 7)  Potassium 99 Mg Tabs (Potassium) 8)  Cranberry 500 Mg Caps (Cranberry) 9)  Astragalus Root Powd (Tragacanth) 10)  Nasonex 50 Mcg/act Susp (Mometasone furoate) .... 2 sprays per nostril daily 11)  Ibuprofen 600 Mg Tabs (Ibuprofen) .Marland Kitchen.. 1 tab by mouth three times a day with meals as needed for pain 12)  Ondansetron 8 Mg Tbdp (Ondansetron) .Marland Kitchen.. 1 tab by mouth three times a day as needed nausea  Other Orders: T-CBC w/Diff ([id number])  Patient Instructions: 1)  CT urogram downstairs now. 2)  Then, do labs. 3)  Will call you w/ results tonight. 4)  Start RX ibuprofen for pain and odansetron for nausea. 5)  Hydrate, rest. Prescriptions: ONDANSETRON 8 MG TBDP (ONDANSETRON) 1 tab by mouth three times a day as needed nausea  #24 x 0   Entered and Authorized by:   Seymour Bars DO   Signed by:   Seymour Bars DO on 10/03/2010   Method used:   Electronically to        [facility]  [id number]* (retail)       [address]       [place], [place]  [address]       Ph: [phone]       Fax: [phone]   RxID:   [id number] IBUPROFEN 600 MG TABS (IBUPROFEN) 1 tab by mouth three times a day with meals as needed for pain  #30 x 0   Entered and Authorized by:   Seymour Bars DO   Signed by:   Seymour Bars DO on 10/03/2010   Method used:   Electronically to        [facility]  [id number]* (retail)       [address]       [place], [place]  [address]       Ph: [phone]       Fax: [phone]   RxID:   [id number]    Orders Added: 1)  UA Dipstick w/o Micro (automated)  [81003] 2)  T-CT Abdomen/pelvis w/o [74176] 3)  T-CBC w/Diff [[id number]] 4)  T-Comprehensive Metabolic Panel [[id number]] 5)  Est.  Patient Level IV [78938]    Laboratory Results   Urine Tests    Routine Urinalysis   Color: yellow Appearance: Clear Glucose: negative   (Normal Range: Negative) Bilirubin: negative   (Normal Range: Negative) Ketone: negative   (Normal Range: Negative) Spec. Gravity: 1.015   (  Normal Range: 1.003-1.035) Blood: negative   (Normal Range: Negative) pH: 7.0   (Normal Range: 5.0-8.0) Protein: negative   (Normal Range: Negative) Urobilinogen: 0.2   (Normal Range: 0-1) Nitrite: negative   (Normal Range: Negative) Leukocyte Esterace: negative   (Normal Range: Negative)

## 2010-11-11 NOTE — Assessment & Plan Note (Signed)
NAMEKYLIANA, Lloyd NO.:  [id number]  MEDICAL RECORD NO.:  [id number]           PATIENT TYPE:  LOCATION:  [facility]           FACILITY:  PHYSICIAN:  Allie Bossier, MD             DATE OF BIRTH:  DATE OF SERVICE:  10/12/2010                                 CLINIC NOTE  Ms. Lynn Lloyd is a 55 year old married white, G3, P4 with children ages 78 and 22, 46 and 73.  Her first grandchild is due in July.  She was sent here from Dr. Cathey Endow because of approximately 1-month history of right pelvic pain began in December, but much worse in the last 2 weeks.  She says this pain is worse than childbirth and it stretches in the right pelvis along the course of the ureter.  She had a CT scan done on October 03, 2010, that showed a normal uterus and ovaries, scattered pelvic phlebolith and a small, nonobstructing kidney stone in the right kidney.  Please note that she tells me she has a very high pain tolerance and that she did not have any pain medicine with her childbirth, but she does say this pain is worse than childbirth.  She has some Vicodin at home from her recent foot surgery (she took none of that for postop pain).  She has not yet resorted to taking it for this colicky pain.  PAST MEDICAL HISTORY:  As above plus hemorrhoids and chronic constipation.  MEDICATIONS:  She takes magnesium as a stool softener on a p.r.n. basis. She takes Advil and Tylenol on a p.r.n. basis and she has Vicodin at home, but does not take it.  ALLERGIES:  ADHESIVE and LATEX.  SOCIAL HISTORY:  She drinks social alcohol.  She does not smoke or do any drugs.  REVIEW OF SYSTEMS:  Her last Pap smear was in April 2010 and she gives a history of normal Pap smears.  Last mammogram was October 2011.  She says it was normal and her colonoscopy was done at age 42.  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  She has been married since 31.  She rarely has  intercourse since her husband suffers from ED from an enlarged prostate.  She was menopausal in 2005, has had no postmenopausal bleeding and rarely has a hot flash.  PHYSICAL EXAMINATION:  GENERAL:  Well-nourished, well-hydrated pleasant white female who appears approximately 10 years younger than her stated age.  She is 5 feet and 8 inches and weighs 149 pounds. VITAL SIGNS:  Her blood pressure is 113/84 and her pulse is 76. HEENT:  Normal. BREAST:  Normal bilaterally. HEART:  Regular rate and rhythm. LUNGS:  Clear to auscultation bilaterally. ABDOMEN:  Scaphoid benign.  No palpable hepatosplenomegaly. EXTERNAL GENITALIA:  Shaved.  No lesions.  There is a moderate amount of atrophy.  Cervix appears normal.  Her uterus is normal size and shape, retroverted, nontender.  Adnexa nontender.  No masses.  ASSESSMENT AND PLAN: 1. Annual exam.  I have checked her Pap smear.  Recommend self-breast     with self-vulvar exams. 2. I believe her right lower quadrant  pain is from her kidney stone.     There is certainly no evidence of any Gyn pathology and I have     offered her a Urologic consult, but I have told her that in general     the treatment for a nonobstructing kidney stone is hydration and     pain management, so I told her that she can take her Vicodin that     she has at home and then she should be well hydrated with water and     that if these symptoms do not resolve over the next few weeks (if     she does not pass it), then I will be happy to send her to a     urologist at any point.     Allie Bossier, MD    MCD/MEDQ  D:  10/12/2010  T:  10/13/2010  Job:  [id number]  cc:   Dr. Cathey Endow

## 2011-06-07 ENCOUNTER — Other Ambulatory Visit: Payer: Self-pay | Admitting: Family Medicine

## 2011-06-07 DIAGNOSIS — Z1231 Encounter for screening mammogram for malignant neoplasm of breast: Secondary | ICD-10-CM

## 2011-06-13 ENCOUNTER — Ambulatory Visit: Payer: BC Managed Care – PPO

## 2011-06-18 ENCOUNTER — Inpatient Hospital Stay (INDEPENDENT_AMBULATORY_CARE_PROVIDER_SITE_OTHER)
Admission: RE | Admit: 2011-06-18 | Discharge: 2011-06-18 | Disposition: A | Discharge status: Home / Self Care | Payer: BC Managed Care – PPO | Source: Ambulatory Visit | Attending: Family Medicine | Admitting: Family Medicine

## 2011-06-18 ENCOUNTER — Encounter: Payer: Self-pay | Admitting: Family Medicine

## 2011-06-18 ENCOUNTER — Encounter: Payer: Self-pay | Admitting: Emergency Medicine

## 2011-06-18 DIAGNOSIS — J069 Acute upper respiratory infection, unspecified: Secondary | ICD-10-CM

## 2011-06-18 DIAGNOSIS — J029 Acute pharyngitis, unspecified: Secondary | ICD-10-CM

## 2011-06-18 DIAGNOSIS — M940 Chondrocostal junction syndrome [Tietze]: Secondary | ICD-10-CM

## 2011-06-27 ENCOUNTER — Ambulatory Visit
Admission: RE | Admit: 2011-06-27 | Discharge: 2011-06-27 | Disposition: A | Discharge status: Home / Self Care | Payer: BC Managed Care – PPO | Source: Ambulatory Visit | Attending: Family Medicine | Admitting: Family Medicine

## 2011-06-27 DIAGNOSIS — Z1231 Encounter for screening mammogram for malignant neoplasm of breast: Secondary | ICD-10-CM

## 2011-06-30 ENCOUNTER — Other Ambulatory Visit: Payer: Self-pay | Admitting: Family Medicine

## 2011-06-30 DIAGNOSIS — R928 Other abnormal and inconclusive findings on diagnostic imaging of breast: Secondary | ICD-10-CM

## 2011-07-14 ENCOUNTER — Ambulatory Visit
Admission: RE | Admit: 2011-07-14 | Discharge: 2011-07-14 | Disposition: A | Discharge status: Home / Self Care | Payer: BC Managed Care – PPO | Source: Ambulatory Visit | Attending: Family Medicine | Admitting: Family Medicine

## 2011-07-14 DIAGNOSIS — R928 Other abnormal and inconclusive findings on diagnostic imaging of breast: Secondary | ICD-10-CM

## 2011-07-24 NOTE — Progress Notes (Signed)
Summary: cough/TM (room 4)   Vital Signs:  Patient Profile:   55 Years Old Female CC:      URI S&S x 6 days Height:     67.5 inches Weight:      149 pounds O2 Sat:      100 % O2 treatment:    Room Air Temp:     98.9 degrees F oral Pulse rate:   78 / minute Resp:     16 per minute BP sitting:   120 / 81  (left arm) Cuff size:   regular  Vitals Entered By: Lavell Islam RN (June 18, 2011 11:18 AM)                  Updated Prior Medication List: No Medications Current Allergies (reviewed today): ! CODEINE ! AUGMENTIN ! ADHESIVE TAPE ! * MOLDHistory of Present Illness Chief Complaint: URI S&S x 6 days History of Present Illness:  Subjective: Patient complains of sore throat and URI symptoms for 1 week.  She has a history of allergies to mold.  She notes that her last tetanus shot was in 2005 + cough for 3 days, worse at night No pleuritic pain but has soreness in anterior chest ? wheezing + nasal congestion + post-nasal drainage + sinus pain/pressure on right;  no toothache No itchy/red eyes No earache No hemoptysis No SOB + fever/chills No nausea No vomiting No abdominal pain No diarrhea No skin rashes + fatigue + myalgias + headache Used OTC meds without relief   REVIEW OF SYSTEMS Constitutional Symptoms       Complains of fever.     Denies chills, night sweats, weight loss, weight gain, and fatigue.  Eyes       Denies change in vision, eye pain, eye discharge, glasses, contact lenses, and eye surgery. Ear/Nose/Throat/Mouth       Complains of frequent nose bleeds, sinus problems, sore throat, and hoarseness.      Denies hearing loss/aids, change in hearing, ear pain, ear discharge, dizziness, frequent runny nose, and tooth pain or bleeding.  Respiratory       Complains of dry cough and shortness of breath.      Denies productive cough, wheezing, asthma, bronchitis, and emphysema/COPD.      Comments: chest pain Cardiovascular       Complains of  tires easily with exhertion.      Denies murmurs and chest pain.    Gastrointestinal       Complains of nausea/vomiting.      Denies stomach pain, diarrhea, constipation, blood in bowel movements, and indigestion.      Comments: nausea only Genitourniary       Denies painful urination, kidney stones, and loss of urinary control. Neurological       Complains of headaches.      Denies paralysis, seizures, and fainting/blackouts. Musculoskeletal       Complains of muscle pain and joint pain.      Denies joint stiffness, decreased range of motion, redness, swelling, muscle weakness, and gout.  Skin       Denies bruising, unusual mles/lumps or sores, and hair/skin or nail changes.  Psych       Denies mood changes, temper/anger issues, anxiety/stress, speech problems, depression, and sleep problems. Other Comments: URI S&S x 6 days    Past History:  Past Medical History: Reviewed history from 04/20/2010 and no changes required. 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 D&C 2006 cholecystectomy 1998 salivary gland 1996, R side bunionectomy 1974, R -07-2010 foot surgery 07-2010  Family History: Reviewed history from 12/15/2009 and no changes required. mother died CLL, HTN, thyroid father thyroid, living. brother healthy  Social History: Reviewed history from 12/11/2008 and no changes required. Working at [employer].  Married to [place] and has 4 kids.  Lives with husband and 39 yo son Gala [place]).  Has twin girls.  Menopausal since 2006.  Never smoked.  1 ETOH per wk.  Yoga and walking 3-4 x a wk.  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   Objective:  Appearance:  Patient appears healthy, stated age, and in no acute distress  Eyes:  Pupils are equal, round, and reactive to light and accomodation.  Extraocular movement is intact.   Conjunctivae are not inflamed.  Ears:  Canals normal.  Tympanic membranes normal with possibly some clear effusion on the left Nose:  Mildly congested turbinates.  Mild right maxillary sinus tenderness  Pharynx:  Minimal erythema Neck:  Supple. Tender shotty posterior nodes are palpated bilaterally.  Lungs:  Clear to auscultation.  Breath sounds are equal.  Chest:  Distinct tenderness to palpation over the mid-sternum  Heart:  Regular rate and rhythm without murmurs, rubs, or gallops.  Abdomen:  Nontender without masses or hepatosplenomegaly.  Bowel sounds are present.  No CVA or flank tenderness.  Extremities:  No edema.  No calf tenderness Skin:  No rash Rapid strep test negative  Assessment New Problems: COSTOCHONDRITIS, ACUTE (ICD-733.6) UPPER RESPIRATORY INFECTION, ACUTE (ICD-465.9) ACUTE PHARYNGITIS (ICD-462)  NO DEFINITE EVIDENCE BACTERIAL INFECTION TODAY  Plan New Medications/Changes: BENZONATATE 200 MG CAPS (BENZONATATE) One by mouth hs as needed cough  #12 x 0, 06/18/2011, Donna Christen MD AMOXICILLIN 875 MG TABS (AMOXICILLIN) One by mouth two times a day (Rx void after 06/25/11)  #20 x 0, 06/18/2011, Donna Christen MD  New Orders: Rapid Strep [04540] T-Culture, Throat [98119-14782] Pulse Oximetry (single measurment) [95621] New Patient Level IV [99204] Services provided After hours-Weekends-Holidays [99051] Planning Comments:   Treat symptomatically for now:  Increase fluid intake, begin expectorant/decongestant, topical decongestant,  cough suppressant at bedtime.  If fever/chills/sweats persist, or if not improving 5  days begin amoxicillin (given Rx to hold).  Followup with PCP if not improving 7 to 10 days.  May take Ibuprofen 200mg , 4 tabs every 8 hours with food for sternum discomfort.  Given a Water quality scientist patient information and instruction sheet on topic costochondritis   The patient and/or caregiver has been counseled thoroughly with regard to medications prescribed  including dosage, schedule, interactions, rationale for use, and possible side effects and they verbalize understanding.  Diagnoses and expected course of recovery discussed and will return if not improved as expected or if the condition worsens. Patient and/or caregiver verbalized understanding.  Prescriptions: BENZONATATE 200 MG CAPS (BENZONATATE) One by mouth hs as needed cough  #12 x 0   Entered and Authorized by:   Donna Christen MD   Signed by:   Donna Christen MD on 06/18/2011   Method used:   Print then Give to Patient   RxID:   [id number] AMOXICILLIN 875 MG TABS (AMOXICILLIN) One by mouth two times a day (Rx void after 06/25/11)  #20 x 0   Entered and Authorized by:   Donna Christen MD   Signed by:   Donna Christen MD on 06/18/2011   Method used:   Print then Give  to Patient   RxID:   [id number]   Patient Instructions: 1)  Take Mucinex D (guaifenesin with decongestant) twice daily for congestion. 2)  Increase fluid intake, rest. 3)  Stop antihistamines 4)  May use Afrin nasal spray (or generic oxymetazoline) twice daily for about 5 days.  Also recommend using saline nasal spray several times daily and/or saline nasal irrigation. 5)  Begin Amoxicillin if not improving about 5 days or if persistent fever develops. 6)  Recommend Tdap when well 7)  Followup with family doctor if not improving 7 to 10 days.   Orders Added: 1)  Rapid Strep [87880] 2)  T-Culture, Throat [[id number]] 3)  Pulse Oximetry (single measurment) [94760] 4)  New Patient Level IV [30865] 5)  Services provided After hours-Weekends-Holidays [99051]    Laboratory Results  Date/Time Received: June 18, 2011 11:41 AM  Date/Time Reported: June 18, 2011 11:41 AM   Other Tests  Rapid Strep: negative  Kit Test Internal QC: Negative   (Normal Range: Negative)

## 2011-10-25 ENCOUNTER — Encounter: Payer: Self-pay | Admitting: Obstetrics & Gynecology

## 2011-10-25 ENCOUNTER — Ambulatory Visit (INDEPENDENT_AMBULATORY_CARE_PROVIDER_SITE_OTHER): Payer: BC Managed Care – PPO | Admitting: Obstetrics & Gynecology

## 2011-10-25 VITALS — BP 132/83 | HR 74 | Temp 97.6°F | Resp 16 | Ht 68.0 in | Wt 150.0 lb

## 2011-10-25 DIAGNOSIS — Z113 Encounter for screening for infections with a predominantly sexual mode of transmission: Secondary | ICD-10-CM

## 2011-10-25 DIAGNOSIS — Z0000 Encounter for general adult medical examination without abnormal findings: Secondary | ICD-10-CM

## 2011-10-25 DIAGNOSIS — Z7251 High risk heterosexual behavior: Secondary | ICD-10-CM

## 2011-10-25 DIAGNOSIS — Z01419 Encounter for gynecological examination (general) (routine) without abnormal findings: Secondary | ICD-10-CM

## 2011-10-25 DIAGNOSIS — Z124 Encounter for screening for malignant neoplasm of cervix: Secondary | ICD-10-CM

## 2011-10-25 DIAGNOSIS — Z23 Encounter for immunization: Secondary | ICD-10-CM

## 2011-10-25 NOTE — Progress Notes (Signed)
Subjective:    Lynn Lloyd is a 56 y.o. female who presents for an annual exam. The patient has no complaints today. She would like STI testing along with cholesterol testing soon. The patient is not currently sexually active. GYN screening history: last pap: was normal. The patient wears seatbelts: yes. The patient participates in regular exercise: yes. Has the patient ever been transfused or tattooed?: no. The patient reports that there is not domestic violence in her life.   Menstrual History: OB History    Grav Para Term Preterm Abortions TAB SAB Ect Mult Living   3 3 3      1 4       Menarche age: 78 No LMP recorded. Patient is postmenopausal.    The following portions of the patient's history were reviewed and updated as appropriate: allergies, current medications, past family history, past medical history, past social history, past surgical history and problem list.  Review of Systems A comprehensive review of systems was negative. She hasn't had sex since June 2012 as she is separated from her husband. Her mammogram was normal in 11/12.   Objective:    BP 132/83  Pulse 74  Temp(Src) 97.6 F (36.4 C) (Oral)  Resp 16  Ht 5\' 8"  (1.727 m)  Wt 150 lb (68.04 kg)  BMI 22.81 kg/m2  General Appearance:    Alert, cooperative, no distress, appears stated age  Head:    Normocephalic, without obvious abnormality, atraumatic  Eyes:    PERRL, conjunctiva/corneas clear, EOM's intact, fundi    benign, both eyes  Ears:    Normal TM's and external ear canals, both ears  Nose:   Nares normal, septum midline, mucosa normal, no drainage    or sinus tenderness  Throat:   Lips, mucosa, and tongue normal; teeth and gums normal  Neck:   Supple, symmetrical, trachea midline, no adenopathy;    thyroid:  no enlargement/tenderness/nodules; no carotid   bruit or JVD  Back:     Symmetric, no curvature, ROM normal, no CVA tenderness  Lungs:     Clear to auscultation bilaterally, respirations  unlabored  Chest Wall:    No tenderness or deformity   Heart:    Regular rate and rhythm, S1 and S2 normal, no murmur, rub   or gallop  Breast Exam:    No tenderness, masses, or nipple abnormality  Abdomen:     Soft, non-tender, bowel sounds active all four quadrants,    no masses, no organomegaly  Genitalia:    Normal female without lesion, discharge or tenderness, moderate atrophy, No adnexal masses, uterus NSSA, NT     Extremities:   Extremities normal, atraumatic, no cyanosis or edema  Pulses:   2+ and symmetric all extremities  Skin:   Skin color, texture, turgor normal, no rashes or lesions  Lymph nodes:   Cervical, supraclavicular, and axillary nodes normal  Neurologic:   CNII-XII intact, normal strength, sensation and reflexes    throughout  .    Assessment:    Healthy female exam.    Plan:     Pap smear.

## 2011-10-27 ENCOUNTER — Other Ambulatory Visit (INDEPENDENT_AMBULATORY_CARE_PROVIDER_SITE_OTHER): Payer: BC Managed Care – PPO

## 2011-10-27 DIAGNOSIS — Z01419 Encounter for gynecological examination (general) (routine) without abnormal findings: Secondary | ICD-10-CM

## 2011-10-27 NOTE — Progress Notes (Signed)
Patient ID: Lynn Lloyd, female   DOB: [birth year], 56 y.o.   MRN: [id number] Pt here for a fasting lab draw per Dr. Marice Potter.

## 2011-10-28 LAB — RPR

## 2011-10-28 LAB — COMPREHENSIVE METABOLIC PANEL
ALT: 14 U/L (ref 0–35)
AST: 19 U/L (ref 0–37)
Albumin: 4.5 g/dL (ref 3.5–5.2)
Alkaline Phosphatase: 74 U/L (ref 39–117)
Chloride: 107 mEq/L (ref 96–112)
Potassium: 5.9 mEq/L — ABNORMAL HIGH (ref 3.5–5.3)
Sodium: 143 mEq/L (ref 135–145)
Total Protein: 7.1 g/dL (ref 6.0–8.3)

## 2011-10-28 LAB — TSH: TSH: 2.154 u[IU]/mL (ref 0.350–4.500)

## 2011-10-28 LAB — LIPID PANEL: HDL: 70 mg/dL (ref >39)

## 2011-10-31 LAB — HEPATITIS C RNA QUANTITATIVE

## 2012-04-17 ENCOUNTER — Encounter: Payer: Self-pay | Admitting: Physician Assistant

## 2012-04-17 ENCOUNTER — Ambulatory Visit (INDEPENDENT_AMBULATORY_CARE_PROVIDER_SITE_OTHER): Payer: BC Managed Care – PPO | Admitting: Physician Assistant

## 2012-04-17 VITALS — BP 125/82 | HR 67 | Temp 98.4°F | Ht 68.0 in | Wt 147.0 lb

## 2012-04-17 DIAGNOSIS — R300 Dysuria: Secondary | ICD-10-CM

## 2012-04-17 DIAGNOSIS — R1032 Left lower quadrant pain: Secondary | ICD-10-CM

## 2012-04-17 DIAGNOSIS — R350 Frequency of micturition: Secondary | ICD-10-CM

## 2012-04-17 LAB — POCT URINALYSIS DIPSTICK
Bilirubin, UA: NEGATIVE
Glucose, UA: NEGATIVE
Ketones, UA: NEGATIVE
Spec Grav, UA: 1.01
Urobilinogen, UA: 0.2

## 2012-04-17 NOTE — Patient Instructions (Addendum)
Will call with culture results.   Dysuria Dysuria is the medical term for pain with urination. There are many causes for dysuria, but urinary tract infection is the most common. If a urinalysis was performed it can show that there is a urinary tract infection. A urine culture confirms that you or your child is sick. You will need to follow up with a healthcare provider because:  If a urine culture was done you will need to know the culture results and treatment recommendations.   If the urine culture was positive, you or your child will need to be put on antibiotics or know if the antibiotics prescribed are the right antibiotics for your urinary tract infection.   If the urine culture is negative (no urinary tract infection), then other causes may need to be explored or antibiotics need to be stopped.  Today laboratory work may have been done and there does not seem to be an infection. If cultures were done they will take at least 24 to 48 hours to be completed. Today x-rays may have been taken and they read as normal. No cause can be found for the problems. The x-rays may be re-read by a radiologist and you will be contacted if additional findings are made. You or your child may have been put on medications to help with this problem until you can see your primary caregiver. If the problems get better, see your primary caregiver if the problems return. If you were given antibiotics (medications which kill germs), take all of the mediations as directed for the full course of treatment.  If laboratory work was done, you need to find the results. Leave a telephone number where you can be reached. If this is not possible, make sure you find out how you are to get test results. HOME CARE INSTRUCTIONS   Drink lots of fluids. For adults, drink eight, 8 ounce glasses of clear juice or water a day. For children, replace fluids as suggested by your caregiver.   Empty the bladder often. Avoid holding urine  for long periods of time.   After a bowel movement, women should cleanse front to back, using each tissue only once.   Empty your bladder before and after sexual intercourse.   Take all the medicine given to you until it is gone. You may feel better in a few days, but TAKE ALL MEDICINE.   Avoid caffeine, tea, alcohol and carbonated beverages, because they tend to irritate the bladder.   In men, alcohol may irritate the prostate.   Only take over-the-counter or prescription medicines for pain, discomfort, or fever as directed by your caregiver.   If your caregiver has given you a follow-up appointment, it is very important to keep that appointment. Not keeping the appointment could result in a chronic or permanent injury, pain, and disability. If there is any problem keeping the appointment, you must call back to this facility for assistance.  SEEK IMMEDIATE MEDICAL CARE IF:   Back pain develops.   A fever develops.   There is nausea (feeling sick to your stomach) or vomiting (throwing up).   Problems are no better with medications or are getting worse.  MAKE SURE YOU:   Understand these instructions.   Will watch your condition.   Will get help right away if you are not doing well or get worse.  Document Released: 05/05/2004 Document Revised: 07/27/2011 Document Reviewed: 03/12/2008 [facility] Patient Information 2012 [place], [place].

## 2012-04-17 NOTE — Progress Notes (Signed)
  Subjective:    Patient ID: Lynn Lloyd, female    DOB: Oct 20, 1955, 56 y.o.   MRN: [id number]  HPI Pt presents to the clinic with chief complaint of dysuria and urinary frequency for 4 days. She denies any fever but reports feeling "warm" a few nights. Her lower back is achy bilaterally. She has felt a little congested in her right sinus and nostril. She has not been sexually active in over a year and has had a STD panel done. Denies any GI problems such as Nausea or vomiting/diarrhea. She has not had any abdominal pain. She denies any odors or discharge. She did have a UTI a couple of months ago that was really bad and made her feel awful. She has not taken anything to make better nothing seems to make worse her symptoms have remain stable for the four days. 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 Last colonoscopy was about 3 years ago per pt. Never showed any diverticulosis.   Review of Systems     Objective:   Physical Exam  Constitutional: She is oriented to person, place, and time. She appears well-developed and well-nourished.  HENT:  Head: Normocephalic and atraumatic.  Cardiovascular: Normal rate, regular rhythm and normal heart sounds.   Pulmonary/Chest: Effort normal and breath sounds normal.       NO CVA tenderness. No tenderness to palpation of the lower back.  Abdominal: Soft. Bowel sounds are normal. She exhibits no distension and no mass. There is tenderness. There is guarding. There is no rebound.       Tenderness to palpation in the left lower quadrant.   Neurological: She is alert and oriented to person, place, and time.  Skin: Skin is warm and dry.  Psychiatric: She has a normal mood and affect. Her behavior is normal.          Assessment & Plan:  Dysuria/urinary frequency-UA neg for blood, leuks, and nitrates. Sent for culture. Will call with results. Stay hydrated. Can use Azo for 3 days to help with pain relief. Call  if symptoms worsen.    Left lower abdominal pain- May need to consider imaging or back to GI for recheck colonoscopy. Will hold off right now. Let's make sure that culture is clean and not radiation of pain. Pt is to call if she starts to run a fever or the pain gets worse. She denies being sexual active so STD's are not probable. This could be bacterial imbalance. Pt took recent abx and may have disrupted bacterial imbalance. Will wait to do further testing.

## 2012-04-19 LAB — URINE CULTURE: Colony Count: 7000

## 2012-08-02 ENCOUNTER — Ambulatory Visit (INDEPENDENT_AMBULATORY_CARE_PROVIDER_SITE_OTHER): Payer: BC Managed Care – PPO | Admitting: Physician Assistant

## 2012-08-02 ENCOUNTER — Encounter: Payer: Self-pay | Admitting: Physician Assistant

## 2012-08-02 VITALS — BP 108/76 | HR 74 | Temp 98.0°F | Ht 68.0 in | Wt 148.0 lb

## 2012-08-02 DIAGNOSIS — R21 Rash and other nonspecific skin eruption: Secondary | ICD-10-CM

## 2012-08-02 MED ORDER — CLOTRIMAZOLE-BETAMETHASONE 1-0.05 % EX CREA: TOPICAL_CREAM | Freq: Two times a day (BID) | CUTANEOUS | Status: DC

## 2012-08-02 NOTE — Patient Instructions (Addendum)
Use cream twice a day for rash. Use no longer than 4 weeks.

## 2012-08-02 NOTE — Progress Notes (Signed)
  Subjective:    Patient ID: Lynn Lloyd, female    DOB: 1955/12/17, 56 y.o.   MRN: [id number]  HPI Patient is a 56 yo female who presents to the clinic with a rash on her right palm that she describes as yellow small bumps. The rash started back in August of this year. She tried OTC steroid and seemed to get some better then as she continued with steroid seemed to get much worse. 1 1/2 weeks ago she started using antifungal cream that has improved rash significantly but there is still some calused scaly area. Her palm remains itchy but denies any pain. No fever, chills, or URI symptoms.   Review of Systems     Objective:   Physical Exam  Constitutional: She is oriented to person, place, and time. She appears well-developed and well-nourished.  HENT:  Head: Normocephalic and atraumatic.  Neurological: She is alert and oriented to person, place, and time.  Skin:       Oblong area about 6cm by 4cm that is callused with scaly skin on top. Minimal amount of erythema around rash. NO active drainage or vesicles.   Psychiatric: She has a normal mood and affect. Her behavior is normal.          Assessment & Plan:  Rash- From history I first thought dyshidrotic eczema however, with response to steroid it does sound fungal in nature. I think there could be multiple components. Gave lotrisone to put on rash twice a day. Discussed with patient to keep area dry. If not improving call office.

## 2012-11-01 ENCOUNTER — Encounter: Payer: Self-pay | Admitting: Physician Assistant

## 2012-11-01 ENCOUNTER — Ambulatory Visit (INDEPENDENT_AMBULATORY_CARE_PROVIDER_SITE_OTHER): Payer: BC Managed Care – PPO | Admitting: Physician Assistant

## 2012-11-01 VITALS — BP 120/90 | HR 61 | Wt 149.0 lb

## 2012-11-01 DIAGNOSIS — L301 Dyshidrosis [pompholyx]: Secondary | ICD-10-CM

## 2012-11-01 MED ORDER — BETAMETHASONE DIPROPIONATE 0.05 % EX OINT: TOPICAL_OINTMENT | Freq: Two times a day (BID) | CUTANEOUS | Status: DC

## 2012-11-01 NOTE — Patient Instructions (Addendum)
dyshidrotic eczema aafp/medscape   Continue to use cream as needed.   Eczema Atopic dermatitis, or eczema, is an inherited type of sensitive skin. Often people with eczema have a family history of allergies, asthma, or hay fever. It causes a red itchy rash and dry scaly skin. The itchiness may occur before the skin rash and may be very intense. It is not contagious. Eczema is generally worse during the cooler winter months and often improves with the warmth of summer. Eczema usually starts showing signs in infancy. Some children outgrow eczema, but it may last through adulthood. Flare-ups may be caused by:  Eating something or contact with something you are sensitive or allergic to.  Stress. DIAGNOSIS  The diagnosis of eczema is usually based upon symptoms and medical history. TREATMENT  Eczema cannot be cured, but symptoms usually can be controlled with treatment or avoidance of allergens (things to which you are sensitive or allergic to).  Controlling the itching and scratching.  Use over-the-counter antihistamines as directed for itching. It is especially useful at night when the itching tends to be worse.  Use over-the-counter steroid creams as directed for itching.  Scratching makes the rash and itching worse and may cause impetigo (a skin infection) if fingernails are contaminated (dirty).  Keeping the skin well moisturized with creams every day. This will seal in moisture and help prevent dryness. Lotions containing alcohol and water can dry the skin and are not recommended.  Limiting exposure to allergens.  Recognizing situations that cause stress.  Developing a plan to manage stress. HOME CARE INSTRUCTIONS   Take prescription and over-the-counter medicines as directed by your caregiver.  Do not use anything on the skin without checking with your caregiver.  Keep baths or showers short (5 minutes) in warm (not hot) water. Use mild cleansers for bathing. You may add  non-perfumed bath oil to the bath water. It is best to avoid soap and bubble bath.  Immediately after a bath or shower, when the skin is still damp, apply a moisturizing ointment to the entire body. This ointment should be a petroleum ointment. This will seal in moisture and help prevent dryness. The thicker the ointment the better. These should be unscented.  Keep fingernails cut short and wash hands often. If your child has eczema, it may be necessary to put soft gloves or mittens on your child at night.  Dress in clothes made of cotton or cotton blends. Dress lightly, as heat increases itching.  Avoid foods that may cause flare-ups. Common foods include cow's milk, peanut butter, eggs and wheat.  Keep a child with eczema away from anyone with fever blisters. The virus that causes fever blisters (herpes simplex) can cause a serious skin infection in children with eczema. SEEK MEDICAL CARE IF:   Itching interferes with sleep.  The rash gets worse or is not better within one week following treatment.  The rash looks infected (pus or soft yellow scabs).  You or your child has an oral temperature above 102 F (38.9 C).  Your baby is older than 3 months with a rectal temperature of 100.5 F (38.1 C) or higher for more than 1 day.  The rash flares up after contact with someone who has fever blisters. SEEK IMMEDIATE MEDICAL CARE IF:   Your baby is older than 3 months with a rectal temperature of 102 F (38.9 C) or higher.  Your baby is older than 3 months or younger with a rectal temperature of 100.4 F (38  C) or higher. Document Released: 08/04/2000 Document Revised: 10/30/2011 Document Reviewed: 06/09/2009 [facility] Patient Information 2013 [place], [place].

## 2012-11-01 NOTE — Progress Notes (Signed)
  Subjective:    Patient ID: Lynn Lloyd, female    DOB: 1956/06/29, 57 y.o.   MRN: [id number]  HPI Patient presents to the clinic with rash on right palm of hand and left foot. They do not itch. Lotrisone does help and they go away with treatment but then they come back. She feels like people do not want to touch her hands. No pain associated. Some times the vesicles look like they are going to pop and fluid come out of them.    Review of Systems     Objective:   Physical Exam  Skin:  Tiny patches of vesicles on palm of right hand and side and bottom of left foot.   Psychiatric: She has a normal mood and affect.          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 Gave pt handout that discussed causes and exacerbations. Educated pt that ongoing and skin condition. Can look at things coming in contact with and see if makes worse. Use cream as needed. If would like derm appt to discuss more indepth options more than willing to send you just call. Encouraged pt to keep hands dry.

## 2012-11-13 ENCOUNTER — Ambulatory Visit (INDEPENDENT_AMBULATORY_CARE_PROVIDER_SITE_OTHER): Payer: BC Managed Care – PPO | Admitting: Obstetrics & Gynecology

## 2012-11-13 ENCOUNTER — Encounter: Payer: Self-pay | Admitting: Obstetrics & Gynecology

## 2012-11-13 VITALS — BP 110/75 | HR 77 | Resp 16 | Ht 68.0 in | Wt 143.0 lb

## 2012-11-13 DIAGNOSIS — N9089 Other specified noninflammatory disorders of vulva and perineum: Secondary | ICD-10-CM

## 2012-11-13 DIAGNOSIS — Z0000 Encounter for general adult medical examination without abnormal findings: Secondary | ICD-10-CM

## 2012-11-13 LAB — COMPREHENSIVE METABOLIC PANEL
ALT: 16 U/L (ref 0–35)
Albumin: 4.7 g/dL (ref 3.5–5.2)
CO2: 28 mEq/L (ref 19–32)
Calcium: 9.9 mg/dL (ref 8.4–10.5)
Chloride: 103 mEq/L (ref 96–112)
Glucose, Bld: 92 mg/dL (ref 70–99)
Sodium: 140 mEq/L (ref 135–145)
Total Bilirubin: 0.7 mg/dL (ref 0.3–1.2)
Total Protein: 7 g/dL (ref 6.0–8.3)

## 2012-11-13 LAB — CBC
MCHC: 34.2 g/dL (ref 30.0–36.0)
MCV: 88.8 fL (ref 78.0–100.0)
Platelets: 269 10*3/uL (ref 150–400)
RDW: 13.3 % (ref 11.5–15.5)
WBC: 5.6 10*3/uL (ref 4.0–10.5)

## 2012-11-13 LAB — LIPID PANEL
HDL: 68 mg/dL (ref >39)
LDL Cholesterol: 93 mg/dL (ref 0–99)
Triglycerides: 67 mg/dL (ref <150)

## 2012-11-13 LAB — TSH: TSH: 3.18 u[IU]/mL (ref 0.350–4.500)

## 2012-11-13 NOTE — Progress Notes (Signed)
  Subjective:    Patient ID: Lynn Lloyd, female    DOB: 03-Oct-1955, 57 y.o.   MRN: [id number]  HPI  Lynn Lloyd is a 57 yo sep W abstinent lady who is here today for preventative lab work and the complaint of a 1 year h/o perineal lesion. She would like to have a diagnosis for it.  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 Consent signed, time out done Perineum prepped with betadine and infiltrated with 1 cc of 1% lidocaine. The entire lesion was removed.  Hemostasis was obtained with silver nitrate. She tolerated the procedure well.      Assessment & Plan:  Preventative health care- mammogram ordered, lab work drawn RTC for annua Vulvar lesion- now removed. Await pathology results.

## 2012-11-14 ENCOUNTER — Telehealth: Payer: Self-pay | Admitting: *Deleted

## 2012-11-14 NOTE — Telephone Encounter (Signed)
Copy of  Normal labwork mailed to pt's home address.

## 2012-11-20 ENCOUNTER — Ambulatory Visit (HOSPITAL_COMMUNITY)
Admission: RE | Admit: 2012-11-20 | Discharge: 2012-11-20 | Disposition: A | Discharge status: Home / Self Care | Payer: BC Managed Care – PPO | Source: Ambulatory Visit | Attending: Obstetrics & Gynecology | Admitting: Obstetrics & Gynecology

## 2012-11-20 DIAGNOSIS — Z1231 Encounter for screening mammogram for malignant neoplasm of breast: Secondary | ICD-10-CM | POA: Insufficient documentation

## 2012-11-20 DIAGNOSIS — N9089 Other specified noninflammatory disorders of vulva and perineum: Secondary | ICD-10-CM

## 2012-11-26 ENCOUNTER — Ambulatory Visit: Payer: BC Managed Care – PPO | Admitting: Obstetrics & Gynecology

## 2012-11-27 ENCOUNTER — Encounter: Payer: Self-pay | Admitting: Obstetrics & Gynecology

## 2012-11-27 ENCOUNTER — Ambulatory Visit (INDEPENDENT_AMBULATORY_CARE_PROVIDER_SITE_OTHER): Payer: BC Managed Care – PPO | Admitting: Obstetrics & Gynecology

## 2012-11-27 VITALS — BP 117/82 | HR 70 | Resp 14 | Ht 68.0 in | Wt 145.0 lb

## 2012-11-27 DIAGNOSIS — Z01419 Encounter for gynecological examination (general) (routine) without abnormal findings: Secondary | ICD-10-CM

## 2012-11-27 DIAGNOSIS — Z1151 Encounter for screening for human papillomavirus (HPV): Secondary | ICD-10-CM

## 2012-11-27 DIAGNOSIS — Z124 Encounter for screening for malignant neoplasm of cervix: Secondary | ICD-10-CM

## 2012-11-27 DIAGNOSIS — Z0000 Encounter for general adult medical examination without abnormal findings: Secondary | ICD-10-CM

## 2012-11-27 NOTE — Progress Notes (Signed)
Subjective:    Lynn Lloyd is a 57 y.o. female who presents for an annual exam. The patient has no complaints today. The patient is not currently sexually active. GYN screening history: last pap: was normal. The patient wears seatbelts: yes. The patient participates in regular exercise: yes. Has the patient ever been transfused or tattooed?: no. The patient reports that there is not domestic violence in her life.   Menstrual History: OB History   Grav Para Term Preterm Abortions TAB SAB Ect Mult Living   3 3 3      1 4       Menarche age: 70  No LMP recorded. Patient is postmenopausal.    The following portions of the patient's history were reviewed and updated as appropriate: allergies, current medications, past family history, past medical history, past social history, past surgical history and problem list.  Review of Systems A comprehensive review of systems was negative. She has been separated from her husband of 36 years since 11/13. Her colonoscopy at age 80 was normal (she was told to repeat it in 10 years). Her mammogram and labs were normal last week.   Objective:    BP 117/82  Pulse 70  Resp 14  Ht 5\' 8"  (1.727 m)  Wt 145 lb (65.772 kg)  BMI 22.05 kg/m2  General Appearance:    Alert, cooperative, no distress, appears stated age  Head:    Normocephalic, without obvious abnormality, atraumatic  Eyes:    PERRL, conjunctiva/corneas clear, EOM's intact, fundi    benign, both eyes  Ears:    Normal TM's and external ear canals, both ears  Nose:   Nares normal, septum midline, mucosa normal, no drainage    or sinus tenderness  Throat:   Lips, mucosa, and tongue normal; teeth and gums normal  Neck:   Supple, symmetrical, trachea midline, no adenopathy;    thyroid:  no enlargement/tenderness/nodules; no carotid   bruit or JVD  Back:     Symmetric, no curvature, ROM normal, no CVA tenderness  Lungs:     Clear to auscultation bilaterally, respirations unlabored  Chest Wall:     No tenderness or deformity   Heart:    Regular rate and rhythm, S1 and S2 normal, no murmur, rub   or gallop  Breast Exam:    No tenderness, masses, or nipple abnormality  Abdomen:     Soft, non-tender, bowel sounds active all four quadrants,    no masses, no organomegaly  Genitalia:    Normal female without lesion, discharge or tenderness, marked atrophy, shaved, normal vagina and cervix. NSSA, NT, mobile, normal adnexal exam     Extremities:   Extremities normal, atraumatic, no cyanosis or edema  Pulses:   2+ and symmetric all extremities  Skin:   Skin color, texture, turgor normal, no rashes or lesions  Lymph nodes:   Cervical, supraclavicular, and axillary nodes normal  Neurologic:   CNII-XII intact, normal strength, sensation and reflexes    throughout  .    Assessment:    Healthy female exam.    Plan:     Thin prep Pap smear.  with HPV cotesting

## 2013-01-29 ENCOUNTER — Ambulatory Visit (INDEPENDENT_AMBULATORY_CARE_PROVIDER_SITE_OTHER): Payer: BC Managed Care – PPO | Admitting: Physician Assistant

## 2013-01-29 ENCOUNTER — Encounter: Payer: Self-pay | Admitting: Physician Assistant

## 2013-01-29 VITALS — BP 129/83 | HR 59 | Wt 147.0 lb

## 2013-01-29 DIAGNOSIS — R200 Anesthesia of skin: Secondary | ICD-10-CM

## 2013-01-29 DIAGNOSIS — R209 Unspecified disturbances of skin sensation: Secondary | ICD-10-CM

## 2013-01-29 DIAGNOSIS — G5622 Lesion of ulnar nerve, left upper limb: Secondary | ICD-10-CM

## 2013-01-29 DIAGNOSIS — G5620 Lesion of ulnar nerve, unspecified upper limb: Secondary | ICD-10-CM

## 2013-01-29 MED ORDER — BETAMETHASONE DIPROPIONATE 0.05 % EX OINT: TOPICAL_OINTMENT | Freq: Two times a day (BID) | CUTANEOUS | Status: DC

## 2013-01-29 MED ORDER — MELOXICAM 7.5 MG PO TABS: 7.5000 mg | ORAL_TABLET | Freq: Every day | ORAL | Status: DC

## 2013-01-29 NOTE — Patient Instructions (Signed)
Wrist Exercises RANGE OF MOTION (ROM) AND STRETCHING EXERCISES - Wrist Sprain  These exercises may help you when beginning to rehabilitate your injury. Your symptoms may resolve with or without further involvement from your physician, physical therapist or athletic trainer. While completing these exercises, remember:   Restoring tissue flexibility helps normal motion to return to the joints. This allows healthier, less painful movement and activity.  An effective stretch should be held for at least 30 seconds.  A stretch should never be painful. You should only feel a gentle lengthening or release in the stretched tissue. RANGE OF MOTION  Wrist Flexion, Active-Assisted  Extend your right / left elbow with your palm pointing down.*  Gently pull the back of your hand towards you until you feel a gentle stretch on the top of your forearm.  Hold this position for __________ seconds. Repeat __________ times. Complete this exercise __________ times per day.  *If directed by your physician, physical therapist or athletic trainer, complete this stretch with your elbow bent rather than extended. RANGE OF MOTION  Wrist Extension, Active-Assisted   Extend your right / left elbow and turn your palm upwards.*  Gently pull your palm/fingertips back so your wrist extends and your fingers point more toward the ground.  You should feel a gentle stretch on the inside of your forearm.  Hold this position for __________ seconds. Repeat __________ times. Complete this exercise __________ times per day. *If directed by your physician, physical therapist or athletic trainer, complete this stretch with your elbow bent, rather than extended. RANGE OF MOTION  Supination, Active   Stand or sit with your elbows at your side. Bend your right / left elbow to 90 degrees.  Turn your palm upward until you feel a gentle stretch on the inside of your forearm.  Hold this position for __________ seconds. Slowly  release and return to the starting position. Repeat __________ times. Complete this stretch __________ times per day.  RANGE OF MOTION  Pronation, Active   Stand or sit with your elbows at your side. Bend your right / left elbow to 90 degrees.  Turn your palm downward until you feel a gentle stretch on the top of your forearm.  Hold this position for __________ seconds. Slowly release and return to the starting position. Repeat __________ times. Complete this stretch __________ times per day.  STRENGTHENING EXERCISES  These exercises may help you when beginning to rehabilitate your injury. They may resolve your symptoms with or without further involvement from your physician, physical therapist or athletic trainer. While completing these exercises, remember:   Muscles can gain both the endurance and the strength needed for everyday activities through controlled exercises.  Complete these exercises as instructed by your physician, physical therapist or athletic trainer. Progress the resistance and repetitions only as guided.  You may experience muscle soreness or fatigue, but the pain or discomfort you are trying to eliminate should never worsen during these exercises. If this pain does worsen, stop and make certain you are following the directions exactly. If the pain is still present after adjustments, discontinue the exercise until you can discuss the trouble with your clinician. STRENGTH Wrist Flexors  Sit with your right / left forearm palm-up and fully supported. Your elbow should be resting below the height of your shoulder. Allow your wrist to extend over the edge of the surface.  Loosely holding a __________ weight or a piece of rubber exercise band/tubing, slowly curl your hand up toward your forearm.    Hold this position for __________ seconds. Slowly lower the wrist back to the starting position in a controlled manner. Repeat __________ times. Complete this exercise __________  times per day.  STRENGTH  Wrist Extensors  Sit with your right / left forearm palm-down and fully supported. Your elbow should be resting below the height of your shoulder. Allow your wrist to extend over the edge of the surface.  Loosely holding a __________ weight or a piece of rubber exercise band/tubing, slowly curl your handup toward your forearm.  Hold this position for __________ seconds. Slowly lower the wrist back to the starting position in a controlled manner. Repeat __________ times. Complete this exercise __________ times per day.  STRENGTH  Forearm Supinators  Sit with your right / left forearm supported on a table, keeping your elbow below shoulder height. Rest your hand over the edge, palm down.  Gently grip a hammer or a soup ladle.  Without moving your elbow, slowly turn your palm and hand upward to a "thumbs-up" position.  Hold this position for __________ seconds. Slowly return to the starting position. Repeat __________ times. Complete this exercise __________ times per day.  STRENGTH  Forearm Pronators   Sit with your right / left forearm supported on a table, keeping your elbow below shoulder height. Rest your hand over the edge, palm up.  Gently grip a hammer or a soup ladle.  Without moving your elbow, slowly turn your palm and hand upward to a "thumbs-up" position.  Hold this position for __________ seconds. Slowly return to the starting position. Repeat __________ times. Complete this exercise __________ times per day.  STRENGTH - Grip  Grasp a tennis ball, a dense sponge, or a large, rolled sock in your hand.  Squeeze as hard as you can without increasing any pain.  Hold this position for __________ seconds. Release your grip slowly. Repeat __________ times. Complete this exercise __________ times per day.  Document Released: 06/21/2005 Document Revised: 10/30/2011 Document Reviewed: 11/19/2008 [facility]ExitCare Patient Information 2014 [place], [place].

## 2013-01-29 NOTE — Progress Notes (Signed)
  Subjective:    Patient ID: Lynn Lloyd, female    DOB: [birth year], 57 y.o.   MRN: [id number]  HPI Patient presents to the clinic with numbness and tingling going down ulnar nerve and into all fingers and hands. Has been going off and on for 2 weeks but for the last 2 days seems more painful and contstant. 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 With PT pain is minimal. She has a upper spine specialist and she has appt with on Monday. 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 She feels this pain/tingliness is different. She has been using her arms and hands more lately. Symptoms are worse in the morning. She has been lifting her mother and helping her daughter move. She has not taken anything or done anything to make better.     Review of Systems     Objective:   Physical Exam  Constitutional: She appears well-developed and well-nourished.  Musculoskeletal:  Left wrist: Normal ROM. Strength 5/5. No joint swelling or tenderness. Tenderness to palpation over ulnar nerve at wrist. No signs of atropy. Negative tinels/phalens/finklestein.             Assessment & Plan:  Ulnar nerve pain- Placed in wrist brace to wear at night and if helping some during the day. Encouraged rest and no overuse of wrist. Gave mobic to take daily for next couple of weeks. Ice and exercise were given to do as tolerated. Discussed injection with Dr. Karie Schwalbe if irritation continues or testing to see exactly which nerve is entraped.   dyshydrotic eczema- refilled steroid cream.

## 2013-03-17 ENCOUNTER — Encounter: Payer: Self-pay | Admitting: Physician Assistant

## 2013-03-17 ENCOUNTER — Ambulatory Visit (INDEPENDENT_AMBULATORY_CARE_PROVIDER_SITE_OTHER): Payer: BC Managed Care – PPO | Admitting: Physician Assistant

## 2013-03-17 VITALS — BP 142/78 | HR 72 | Wt 147.0 lb

## 2013-03-17 DIAGNOSIS — M79609 Pain in unspecified limb: Secondary | ICD-10-CM

## 2013-03-17 DIAGNOSIS — M4802 Spinal stenosis, cervical region: Secondary | ICD-10-CM

## 2013-03-17 DIAGNOSIS — R200 Anesthesia of skin: Secondary | ICD-10-CM

## 2013-03-17 DIAGNOSIS — M5412 Radiculopathy, cervical region: Secondary | ICD-10-CM

## 2013-03-17 DIAGNOSIS — M79642 Pain in left hand: Secondary | ICD-10-CM

## 2013-03-17 DIAGNOSIS — R209 Unspecified disturbances of skin sensation: Secondary | ICD-10-CM

## 2013-03-17 MED ORDER — PREDNISONE 50 MG PO TABS: ORAL_TABLET | ORAL | Status: DC

## 2013-03-17 MED ORDER — GABAPENTIN 100 MG PO CAPS: 100.0000 mg | ORAL_CAPSULE | Freq: Three times a day (TID) | ORAL | Status: DC

## 2013-03-17 NOTE — Patient Instructions (Addendum)
Cervical Strain and Sprain (Whiplash) with Rehab Cervical strain and sprains are injuries that commonly occur with "whiplash" injuries. Whiplash occurs when the neck is forcefully whipped backward or forward, such as during a motor vehicle accident. The muscles, ligaments, tendons, discs and nerves of the neck are susceptible to injury when this occurs. SYMPTOMS   Pain or stiffness in the front and/or back of neck  Symptoms may present immediately or up to 24 hours after injury.  Dizziness, headache, nausea and vomiting.  Muscle spasm with soreness and stiffness in the neck.  Tenderness and swelling at the injury site. CAUSES  Whiplash injuries often occur during contact sports or motor vehicle accidents.  RISK INCREASES WITH:  Osteoarthritis of the spine.  Situations that make head or neck accidents or trauma more likely.  High-risk sports (football, rugby, wrestling, hockey, auto racing, gymnastics, diving, contact karate or boxing).  Poor strength and flexibility of the neck.  Previous neck injury.  Poor tackling technique.  Improperly fitted or padded equipment. PREVENTION  Learn and use proper technique (avoid tackling with the head, spearing and head-butting; use proper falling techniques to avoid landing on the head).  Warm up and stretch properly before activity.  Maintain physical fitness:  Strength, flexibility and endurance.  Cardiovascular fitness.  Wear properly fitted and padded protective equipment, such as padded soft collars, for participation in contact sports. PROGNOSIS  Recovery for cervical strain and sprain injuries is dependent on the extent of the injury. These injuries are usually curable in 1 week to 3 months with appropriate treatment.  RELATED COMPLICATIONS   Temporary numbness and weakness may occur if the nerve roots are damaged, and this may persist until the nerve has completely healed.  Chronic pain due to frequent recurrence of  symptoms.  Prolonged healing, especially if activity is resumed too soon (before complete recovery). TREATMENT  Treatment initially involves the use of ice and medication to help reduce pain and inflammation. It is also important to perform strengthening and stretching exercises and modify activities that worsen symptoms so the injury does not get worse. These exercises may be performed at home or with a therapist. For patients who experience severe symptoms, a soft padded collar may be recommended to be worn around the neck.  Improving your posture may help reduce symptoms. Posture improvement includes pulling your chin and abdomen in while sitting or standing. If you are sitting, sit in a firm chair with your buttocks against the back of the chair. While sleeping, try replacing your pillow with a small towel rolled to 2 inches in diameter, or use a cervical pillow or soft cervical collar. Poor sleeping positions delay healing.  For patients with nerve root damage, which causes numbness or weakness, the use of a cervical traction apparatus may be recommended. Surgery is rarely necessary for these injuries. However, cervical strain and sprains that are present at birth (congenital) may require surgery. MEDICATION   If pain medication is necessary, nonsteroidal anti-inflammatory medications, such as aspirin and ibuprofen, or other minor pain relievers, such as acetaminophen, are often recommended.  Do not take pain medication for 7 days before surgery.  Prescription pain relievers may be given if deemed necessary by your caregiver. Use only as directed and only as much as you need. HEAT AND COLD:   Cold treatment (icing) relieves pain and reduces inflammation. Cold treatment should be applied for 10 to 15 minutes every 2 to 3 hours for inflammation and pain and immediately after any activity that  need.  HEAT AND COLD:   · Cold treatment (icing) relieves pain and reduces inflammation. Cold treatment should be applied for 10 to 15 minutes every 2 to 3 hours for inflammation and pain and immediately after any activity that aggravates your symptoms. Use ice packs or an ice massage.  · Heat treatment may be used prior to  performing the stretching and strengthening activities prescribed by your caregiver, physical therapist, or athletic trainer. Use a heat pack or a warm soak.  SEEK MEDICAL CARE IF:   · Symptoms get worse or do not improve in 2 weeks despite treatment.  · New, unexplained symptoms develop (drugs used in treatment may produce side effects).  EXERCISES  RANGE OF MOTION (ROM) AND STRETCHING EXERCISES - Cervical Strain and Sprain  These exercises may help you when beginning to rehabilitate your injury. In order to successfully resolve your symptoms, you must improve your posture. These exercises are designed to help reduce the forward-head and rounded-shoulder posture which contributes to this condition. Your symptoms may resolve with or without further involvement from your physician, physical therapist or athletic trainer. While completing these exercises, remember:   · Restoring tissue flexibility helps normal motion to return to the joints. This allows healthier, less painful movement and activity.  · An effective stretch should be held for at least 20 seconds, although you may need to begin with shorter hold times for comfort.  · A stretch should never be painful. You should only feel a gentle lengthening or release in the stretched tissue.  STRETCH- Axial Extensors  · Lie on your back on the floor. You may bend your knees for comfort. Place a rolled up hand towel or dish towel, about 2 inches in diameter, under the part of your head that makes contact with the floor.  · Gently tuck your chin, as if trying to make a "double chin," until you feel a gentle stretch at the base of your head.  · Hold __________ seconds.  Repeat __________ times. Complete this exercise __________ times per day.   STRETECH - Axial Extension   · Stand or sit on a firm surface. Assume a good posture: chest up, shoulders drawn back, abdominal muscles slightly tense, knees unlocked (if standing) and feet hip width apart.  · Slowly retract your  chin so your head slides back and your chin slightly lowers.Continue to look straight ahead.  · You should feel a gentle stretch in the back of your head. Be certain not to feel an aggressive stretch since this can cause headaches later.  · Hold for __________ seconds.  Repeat __________ times. Complete this exercise __________ times per day.  STRETCH  Cervical Side Bend   · Stand or sit on a firm surface. Assume a good posture: chest up, shoulders drawn back, abdominal muscles slightly tense, knees unlocked (if standing) and feet hip width apart.  · Without letting your nose or shoulders move, slowly tip your right / left ear to your shoulder until your feel a gentle stretch in the muscles on the opposite side of your neck.  · Hold __________ seconds.  Repeat __________ times. Complete this exercise __________ times per day.  STRETCH  Cervical Rotators   · Stand or sit on a firm surface. Assume a good posture: chest up, shoulders drawn back, abdominal muscles slightly tense, knees unlocked (if standing) and feet hip width apart.  · Keeping your eyes level with the ground, slowly turn your head until you feel a gentle   stretch along the back and opposite side of your neck.  · Hold __________ seconds.  Repeat __________ times. Complete this exercise __________ times per day.  RANGE OF MOTION - Neck Circles   · Stand or sit on a firm surface. Assume a good posture: chest up, shoulders drawn back, abdominal muscles slightly tense, knees unlocked (if standing) and feet hip width apart.  · Gently roll your head down and around from the back of one shoulder to the back of the other. The motion should never be forced or painful.  · Repeat the motion 10-20 times, or until you feel the neck muscles relax and loosen.  Repeat __________ times. Complete the exercise __________ times per day.  STRENGTHENING EXERCISES - Cervical Strain and Sprain  These exercises may help you when beginning to rehabilitate your injury. They may  resolve your symptoms with or without further involvement from your physician, physical therapist or athletic trainer. While completing these exercises, remember:   · Muscles can gain both the endurance and the strength needed for everyday activities through controlled exercises.  · Complete these exercises as instructed by your physician, physical therapist or athletic trainer. Progress the resistance and repetitions only as guided.  · You may experience muscle soreness or fatigue, but the pain or discomfort you are trying to eliminate should never worsen during these exercises. If this pain does worsen, stop and make certain you are following the directions exactly. If the pain is still present after adjustments, discontinue the exercise until you can discuss the trouble with your clinician.  STRENGTH Cervical Flexors, Isometric  · Face a wall, standing about 6 inches away. Place a small pillow, a ball about 6-8 inches in diameter, or a folded towel between your forehead and the wall.  · Slightly tuck your chin and gently push your forehead into the soft object. Push only with mild to moderate intensity, building up tension gradually. Keep your jaw and forehead relaxed.  · Hold 10 to 20 seconds. Keep your breathing relaxed.  · Release the tension slowly. Relax your neck muscles completely before you start the next repetition.  Repeat __________ times. Complete this exercise __________ times per day.  STRENGTH- Cervical Lateral Flexors, Isometric   · Stand about 6 inches away from a wall. Place a small pillow, a ball about 6-8 inches in diameter, or a folded towel between the side of your head and the wall.  · Slightly tuck your chin and gently tilt your head into the soft object. Push only with mild to moderate intensity, building up tension gradually. Keep your jaw and forehead relaxed.  · Hold 10 to 20 seconds. Keep your breathing relaxed.  · Release the tension slowly. Relax your neck muscles completely before  you start the next repetition.  Repeat __________ times. Complete this exercise __________ times per day.  STRENGTH  Cervical Extensors, Isometric   · Stand about 6 inches away from a wall. Place a small pillow, a ball about 6-8 inches in diameter, or a folded towel between the back of your head and the wall.  · Slightly tuck your chin and gently tilt your head back into the soft object. Push only with mild to moderate intensity, building up tension gradually. Keep your jaw and forehead relaxed.  · Hold 10 to 20 seconds. Keep your breathing relaxed.  · Release the tension slowly. Relax your neck muscles completely before you start the next repetition.  Repeat __________ times. Complete this exercise __________ times per   work. All of your joints have less wear and tear when properly supported by a spine with good posture. This means you will experience a healthier, less painful body.  Correct posture must be practiced with all of your activities, especially prolonged sitting and standing. Correct posture is as important when doing repetitive low-stress activities (typing) as it is when doing a single heavy-load activity (lifting). PROLONGED STANDING WHILE SLIGHTLY LEANING FORWARD When completing a task that requires you to lean forward while  standing in one place for a long time, place either foot up on a stationary 2-4 inch high object to help maintain the best posture. When both feet are on the ground, the low back tends to lose its slight inward curve. If this curve flattens (or becomes too large), then the back and your other joints will experience too much stress, fatigue more quickly and can cause pain.  RESTING POSITIONS Consider which positions are most painful for you when choosing a resting position. If you have pain with flexion-based activities (sitting, bending, stooping, squatting), choose a position that allows you to rest in a less flexed posture. You would want to avoid curling into a fetal position on your side. If your pain worsens with extension-based activities (prolonged standing, working overhead), avoid resting in an extended position such as sleeping on your stomach. Most people will find more comfort when they rest with their spine in a more neutral position, neither too rounded nor too arched. Lying on a non-sagging bed on your side with a pillow between your knees, or on your back with a pillow under your knees will often provide some relief. Keep in mind, being in any one position for a prolonged period of time, no matter how correct your posture, can still lead to stiffness. WALKING Walk with an upright posture. Your ears, shoulders and hips should all line-up. OFFICE WORK When working at a desk, create an environment that supports good, upright posture. Without extra support, muscles fatigue and lead to excessive strain on joints and other tissues. CHAIR:  A chair should be able to slide under your desk when your back makes contact with the back of the chair. This allows you to work closely.  The chair's height should allow your eyes to be level with the upper part of your monitor and your hands to be slightly lower than your elbows.  Body position:  Your feet should make contact with the floor. If this is  not possible, use a foot rest.  Keep your ears over your shoulders. This will reduce stress on your neck and low back. Document Released: 08/07/2005 Document Revised: 10/30/2011 Document Reviewed: 11/19/2008 [facility] Patient Information 2014 [place], [place]. Cervical Radiculopathy Cervical radiculopathy happens when a nerve in the neck is pinched or bruised by a slipped (herniated) disk or by arthritic changes in the bones of the cervical spine. This can occur due to an injury or as part of the normal aging process. Pressure on the cervical nerves can cause pain or numbness that runs from your neck all the way down into your arm and fingers. CAUSES  There are many possible causes, including:  Injury.  Muscle tightness in the neck from overuse.  Swollen, painful joints (arthritis).  Breakdown or degeneration in the bones and joints of the spine (spondylosis) due to aging.  Bone spurs that may develop near the cervical nerves. SYMPTOMS  Symptoms include pain, weakness, or numbness in the affected arm and hand. Pain can be severe or irritating.  Symptoms may be worse when extending or turning the neck. DIAGNOSIS  Your caregiver will ask about your symptoms and do a physical exam. He or she may test your strength and reflexes. X-rays, CT scans, and MRI scans may be needed in cases of injury or if the symptoms do not go away after a period of time. Electromyography (EMG) or nerve conduction testing may be done to study how your nerves and muscles are working. TREATMENT  Your caregiver may recommend certain exercises to help relieve your symptoms. Cervical radiculopathy can, and often does, get better with time and treatment. If your problems continue, treatment options may include:  Wearing a soft collar for short periods of time.  Physical therapy to strengthen the neck muscles.  Medicines, such as nonsteroidal anti-inflammatory drugs (NSAIDs), oral corticosteroids, or spinal  injections.  Surgery. Different types of surgery may be done depending on the cause of your problems. HOME CARE INSTRUCTIONS   Put ice on the affected area.  Put ice in a plastic bag.  Place a towel between your skin and the bag.  Leave the ice on for 15-20 minutes, 3-4 times a day or as directed by your caregiver.  If ice does not help, you can try using heat. Take a warm shower or bath, or use a hot water bottle as directed by your caregiver.  You may try a gentle neck and shoulder massage.  Use a flat pillow when you sleep.  Only take over-the-counter or prescription medicines for pain, discomfort, or fever as directed by your caregiver.  If physical therapy was prescribed, follow your caregiver's directions.  If a soft collar was prescribed, use it as directed. SEEK IMMEDIATE MEDICAL CARE IF:   Your pain gets much worse and cannot be controlled with medicines.  You have weakness or numbness in your hand, arm, face, or leg.  You have a high fever or a stiff, rigid neck.  You lose bowel or bladder control (incontinence).  You have trouble with walking, balance, or speaking. MAKE SURE YOU:   Understand these instructions.  Will watch your condition.  Will get help right away if you are not doing well or get worse. Document Released: 05/02/2001 Document Revised: 10/30/2011 Document Reviewed: 03/21/2011 [facility] Patient Information 2014 [place], [place].

## 2013-03-17 NOTE — Progress Notes (Signed)
  Subjective:    Patient ID: Lynn Lloyd, female    DOB: 1955/10/23, 57 y.o.   MRN: [id number]  HPI Patient presents to the clinic to follow up on left arm and hand numbness. It has started to get much worse. The pain and numbness starts at neck and/or shoulder and goes all the way down in to fingers. She has no strength in her left hand. Simple movements such as rotating wrist are very painful. She has cervical stenosis that she has been seeing her cervical specialist for and doing exercises. They have helped in the past but are not helping at all right now. She tried wrist splint and did not help. Mobic helped some but not significantly. She has started to have tingling on left side of face. Denies any speech slurred or facial droop.   Review of Systems     Objective:   Physical Exam  Constitutional: She is oriented to person, place, and time. She appears well-developed and well-nourished.  HENT:  Head: Normocephalic and atraumatic.  Cardiovascular: Normal rate, regular rhythm and normal heart sounds.   Pulmonary/Chest: Effort normal and breath sounds normal.  Musculoskeletal:  Left arm/hand: ROM normal. Pain over wrist and thenar eminence. Pain with all range of motion. Strength 3/5 in arm. Wrist 3/5. Hand grip 2/5.   Neurological: She is alert and oriented to person, place, and time. She has normal reflexes. No cranial nerve deficit. Coordination normal.  Rhomberg negative.  Skin: Skin is warm and dry.  Psychiatric: She has a normal mood and affect. Her behavior is normal.          Assessment & Plan:  Left arm/hand pain and numbness- Clearly treatment for carpal tunnel symptoms did not work and seems nerves closer to c-spine are becoming more and more inflamed. i suspect cervical stenosis is worsening. Pt has a lot going on with mother and family right now. Will give burst of prednisone, continue on mobic, gave c-spine rOM exercises. Start on neuronton up to TID for nerve pain.  Needs repeat imaging of C-spine MRI to see if surgery is now an option. Will then refer to surgeon. Will get EMG of nerves of left arm. Call if worsening. Continue using night splint may help some.  Spent 30 minutes with patient and greater than 50 percent of visit spent counseling regarding treatment plan of Cervical stenosis and possibility of surgery.

## 2013-03-21 ENCOUNTER — Telehealth: Payer: Self-pay | Admitting: *Deleted

## 2013-03-21 NOTE — Telephone Encounter (Signed)
PA obtained for MRI cervical spine without contrast. auth number is [id number]. Valid through 04/19/2013

## 2013-05-09 ENCOUNTER — Encounter: Payer: Self-pay | Admitting: Physician Assistant

## 2013-05-09 ENCOUNTER — Ambulatory Visit (INDEPENDENT_AMBULATORY_CARE_PROVIDER_SITE_OTHER): Payer: BC Managed Care – PPO | Admitting: Physician Assistant

## 2013-05-09 VITALS — BP 127/77 | HR 64 | Wt 147.0 lb

## 2013-05-09 DIAGNOSIS — M7702 Medial epicondylitis, left elbow: Secondary | ICD-10-CM

## 2013-05-09 DIAGNOSIS — M7700 Medial epicondylitis, unspecified elbow: Secondary | ICD-10-CM

## 2013-05-09 DIAGNOSIS — K1120 Sialoadenitis, unspecified: Secondary | ICD-10-CM

## 2013-05-09 MED ORDER — PREDNISONE 20 MG PO TABS: ORAL_TABLET | ORAL | Status: DC

## 2013-05-09 MED ORDER — DICLOFENAC EPOLAMINE 1.3 % TD PTCH: 1.0000 | MEDICATED_PATCH | Freq: Two times a day (BID) | TRANSDERMAL | Status: DC

## 2013-05-09 NOTE — Patient Instructions (Addendum)
Wear elbow brace.   Salivary Gland Infection A salivary gland infection can be caused by a virus, bacteria from the mouth, or a stone. Mumps and other viruses may settle in one or more of the saliva glands. This will result in swelling, pain, and difficulty eating. Bacteria may cause a more severe infection in a salivary gland. A salivary stone blocking the flow of saliva can make this worse. These infections may be related to other medical problems. Some of these are dehydration, recent surgery, poor nutrition, and some medications. TREATMENT  Treatment of a salivary gland infection depends on the cause. Mumps and other virus infections do not require antibiotics. If bacteria cause the infection, then antibiotics are needed to get rid of the infection. If there is a salivary stone blocking the duct, minor surgery to remove the stone may be needed.  HOME CARE INSTRUCTIONS   Get plenty of rest, increase your fluids, and use warm compresses on the swollen area for 15 to 20 minutes 4 times per day or as often as feels good to you.  Suck on hard candy or chew sugarless gum to promote saliva production.  Only take over-the-counter or prescription medicines for pain, discomfort, or fever as directed by your caregiver. SEEK IMMEDIATE MEDICAL CARE IF:   You have increased swelling or pain or pain not relieved with medications.  You develop chills or a fever.  Any of your problems are getting worse rather than better. Document Released: 09/14/2004 Document Revised: 10/30/2011 Document Reviewed: 08/07/2005 [facility] Patient Information 2014 [place], [place].  Medial Epicondylitis (Golfer's Elbow) with Rehab Medial epicondylitis involves inflammation and pain around the inner (medial) portion of the elbow. This pain is caused by inflammation of the tendons in the forearm that flex (bring down) the wrist. Medial epicondylitis is also called golfer's elbow, because it is common among golfers. However, it  may occur in any individual who flexes the wrist regularly. If medial epicondylitis is left untreated, it may become a chronic problem. SYMPTOMS   Pain, tenderness, or inflammation over the inner (medial) side of the elbow.  Pain or weakness with gripping activities.  Pain that increases with wrist twisting motions (using a screwdriver, playing golf, bowling). CAUSES  Medial epicondylitis is caused by inflammation of the tendons that flex the wrist. Causes of injury may include:  Chronic, repetitive stress and strain to the tendons that run from the wrist and forearm to the elbow.  Sudden strain on the forearm, including wrist snap when serving balls with racquet sports, or throwing a baseball. RISK INCREASES WITH:  Sports or occupations that require repetitive and/or strenuous forearm and wrist movements (pitching a baseball, golfing, carpentry).  Poor wrist and forearm strength and flexibility.  Failure to warm up properly before activity.  Resuming activity before healing, rehabilitation, and conditioning are complete. PREVENTION   Warm up and stretch properly before activity.  Maintain physical fitness:  Strength, flexibility, and endurance.  Cardiovascular fitness.  Wear and use properly fitted equipment.  Learn and use proper technique and have a coach correct improper technique.  Wear a tennis elbow (counterforce) brace. PROGNOSIS  The course of this condition depends on the degree of the injury. If treated properly, acute cases (symptoms lasting less than 4 weeks) are often resolved in 2 to 6 weeks. Chronic (longer lasting cases) often resolve in 3 to 6 months, but may require physical therapy. RELATED COMPLICATIONS   Frequently recurring symptoms, resulting in a chronic problem. Properly treating the problem the first  time decreases frequency of recurrence.  Chronic inflammation, scarring, and partial tendon tear, requiring surgery.  Delayed healing or  resolution of symptoms. TREATMENT  Treatment first involves the use of ice and medicine, to reduce pain and inflammation. Strengthening and stretching exercises may reduce discomfort, if performed regularly. These exercises may be performed at home, if the condition is an acute injury. Chronic cases may require a referral to a physical therapist for evaluation and treatment. Your caregiver may advise a corticosteroid injection to help reduce inflammation. Rarely, surgery is needed. MEDICATION  If pain medicine is needed, nonsteroidal anti-inflammatory medicines (aspirin and ibuprofen), or other minor pain relievers (acetaminophen), are often advised.  Do not take pain medicine for 7 days before surgery.  Prescription pain relievers may be given, if your caregiver thinks they are needed. Use only as directed and only as much as you need.  Corticosteroid injections may be recommended. These injections should be reserved only for the most severe cases, because they can only be given a certain number of times. HEAT AND COLD  Cold treatment (icing) should be applied for 10 to 15 minutes every 2 to 3 hours for inflammation and pain, and immediately after activity that aggravates your symptoms. Use ice packs or an ice massage.  Heat treatment may be used before performing stretching and strengthening activities prescribed by your caregiver, physical therapist, or athletic trainer. Use a heat pack or a warm water soak. SEEK MEDICAL CARE IF: Symptoms get worse or do not improve in 2 weeks, despite treatment. EXERCISES  RANGE OF MOTION (ROM) AND STRETCHING EXERCISES - Epicondylitis, Medial (Golfer's Elbow) These exercises may help you when beginning to rehabilitate your injury. Your symptoms may go away with or without further involvement from your physician, physical therapist or athletic trainer. While completing these exercises, remember:   Restoring tissue flexibility helps normal motion to return  to the joints. This allows healthier, less painful movement and activity.  An effective stretch should be held for at least 30 seconds.  A stretch should never be painful. You should only feel a gentle lengthening or release in the stretched tissue. RANGE OF MOTION  Wrist Flexion, Active-Assisted  Extend your right / left elbow with your fingers pointing down.*  Gently pull the back of your hand towards you, until you feel a gentle stretch on the top of your forearm.  Hold this position for __________ seconds. Repeat __________ times. Complete this exercise __________ times per day.  *If directed by your physician, physical therapist or athletic trainer, complete this stretch with your elbow bent, rather than extended. RANGE OF MOTION  Wrist Extension, Active-Assisted  Extend your right / left elbow and turn your palm upwards.*  Gently pull your palm and fingertips back, so your wrist extends and your fingers point more toward the ground.  You should feel a gentle stretch on the inside of your forearm.  Hold this position for __________ seconds. Repeat __________ times. Complete this exercise __________ times per day. *If directed by your physician, physical therapist or athletic trainer, complete this stretch with your elbow bent, rather than extended. STRETCH  Wrist Extension   Place your right / left fingertips on a tabletop leaving your elbow slightly bent. Your fingers should point backwards.  Gently press your fingers and palm down onto the table, by straightening your elbow. You should feel a stretch on the inside of your forearm.  Hold this position for __________ seconds. Repeat __________ times. Complete this stretch __________ times per  day.  STRENGTHENING EXERCISES - Epicondylitis, Medial (Golfer's Elbow) These exercises may help you when beginning to rehabilitate your injury. They may resolve your symptoms with or without further involvement from your physician,  physical therapist or athletic trainer. While completing these exercises, remember:   Muscles can gain both the endurance and the strength needed for everyday activities through controlled exercises.  Complete these exercises as instructed by your physician, physical therapist or athletic trainer. Increase the resistance and repetitions only as guided.  You may experience muscle soreness or fatigue, but the pain or discomfort you are trying to eliminate should never worsen during these exercises. If this pain does get worse, stop and make sure you are following the directions exactly. If the pain is still present after adjustments, discontinue the exercise until you can discuss the trouble with your caregiver. STRENGTH Wrist Flexors  Sit with your right / left forearm palm-up, and fully supported on a table or countertop. Your elbow should be resting below the height of your shoulder. Allow your wrist to extend over the edge of the surface.  Loosely holding a __________ weight, or a piece of rubber exercise band or tubing, slowly curl your hand up toward your forearm.  Hold this position for __________ seconds. Slowly lower the wrist back to the starting position in a controlled manner. Repeat __________ times. Complete this exercise __________ times per day.  STRENGTH  Wrist Extensors  Sit with your right / left forearm palm-down and fully supported. Your elbow should be resting below the height of your shoulder. Allow your wrist to extend over the edge of the surface.  Loosely holding a __________ weight, or a piece of rubber exercise band or tubing, slowly curl your hand up toward your forearm.  Hold this position for __________ seconds. Slowly lower the wrist back to the starting position in a controlled manner. Repeat __________ times. Complete this exercise __________ times per day.  STRENGTH - Ulnar Deviators  Stand with a ____________________ weight in your right / left hand, or sit  while holding a rubber exercise band or tubing, with your healthy arm supported on a table or countertop.  Move your wrist so that your pinkie travels toward your forearm and your thumb moves away from your forearm.  Hold this position for __________ seconds and then slowly lower the wrist back to the starting position. Repeat __________ times. Complete this exercise __________ times per day STRENGTH - Grip   Grasp a tennis ball, a dense sponge, or a large, rolled sock in your hand.  Squeeze as hard as you can, without increasing any pain.  Hold this position for __________ seconds. Release your grip slowly. Repeat __________ times. Complete this exercise __________ times per day.  STRENGTH  Forearm Supinators   Sit with your right / left forearm supported on a table, keeping your elbow below shoulder height. Rest your hand over the edge, palm down.  Gently grip a hammer or a soup ladle.  Without moving your elbow, slowly turn your palm and hand upward to a "thumbs-up" position.  Hold this position for __________ seconds. Slowly return to the starting position. Repeat __________ times. Complete this exercise __________ times per day.  STRENGTH  Forearm Pronators  Sit with your right / left forearm supported on a table, keeping your elbow below shoulder height. Rest your hand over the edge, palm up.  Gently grip a hammer or a soup ladle.  Without moving your elbow, slowly turn your palm and hand  upward to a "thumbs-up" position.  Hold this position for __________ seconds. Slowly return to the starting position. Repeat __________ times. Complete this exercise __________ times per day.  Document Released: 08/07/2005 Document Revised: 10/30/2011 Document Reviewed: 11/19/2008 [facility] Patient Information 2014 [place], [place].

## 2013-05-11 NOTE — Progress Notes (Signed)
  Subjective:    Patient ID: Lynn Lloyd, female    DOB: 10-22-1955, 57 y.o.   MRN: [id number]  HPI Patient is a 57 yo female who presents to the clinic with right sided jaw pain. She has a long history of salivary stones. She has had to have stones removed at one point. Pain is consistent with stones. Pt has had discomfort for last 2-3 days. Seems to be getting worse. Not tried anything to make better. Denies any fever. Admits to having a bad taste in her mouth.   She also has left medial elbow pain with radiation into left wrist. Pain present off and on for last 3 months. Pain worse with movement and best with massage below elbow. Not really tried anything except oils and NSaIDs. They do help minimally.  She has had pain before but lately has become more constant. She works on [employer] a lot. Not aware of any other consistent movement.    Review of Systems     Objective:   Physical Exam  Constitutional: She is oriented to person, place, and time. She appears well-developed and well-nourished.  HENT:  Head: Normocephalic and atraumatic.  Right Ear: External ear normal.  Left Ear: External ear normal.  Nose: Nose normal.  Mouth/Throat: Oropharynx is clear and moist. No oropharyngeal exudate.  Eyes: Conjunctivae and EOM are normal. Pupils are equal, round, and reactive to light.  Neck: Normal range of motion. Neck supple.  Right side of jaw seems slightly swollen and tender to palpation over right jaw line. No palable lump or nodule.   Cardiovascular: Normal rate, regular rhythm and normal heart sounds.   Pulmonary/Chest: Effort normal and breath sounds normal. She has no wheezes.  Lymphadenopathy:    She has no cervical adenopathy.  Neurological: She is alert and oriented to person, place, and time.  Skin: Skin is warm and dry.  Psychiatric: She has a normal mood and affect. Her behavior is normal.          Assessment & Plan:  Sialenditis- Gave HO. Discussed warm compresses,  NSAIDs, sucking on sour candy. Will give a short course of prednisone to help with any inflammation and swelling. Call if not improving. Pt does have history of stone removal.   Golfers elbow- pain is consistent. Discussed arm brace. Pt reported will get OTC. NSAIDs, icing and exercises were discussed. Prednisone will certainly help. Call if not improving in next 4 weeks.

## 2013-05-12 ENCOUNTER — Telehealth: Payer: Self-pay | Admitting: *Deleted

## 2013-05-12 NOTE — Telephone Encounter (Signed)
Prior auth obtained for [employer] through [employer]. Pharmacy notified. Case ID # [id number]. Barry Dienes, LPN

## 2013-11-05 ENCOUNTER — Telehealth: Payer: Self-pay | Admitting: *Deleted

## 2013-11-05 ENCOUNTER — Encounter: Payer: Self-pay | Admitting: Physician Assistant

## 2013-11-05 ENCOUNTER — Ambulatory Visit (INDEPENDENT_AMBULATORY_CARE_PROVIDER_SITE_OTHER): Payer: BC Managed Care – PPO

## 2013-11-05 ENCOUNTER — Ambulatory Visit (INDEPENDENT_AMBULATORY_CARE_PROVIDER_SITE_OTHER): Payer: BC Managed Care – PPO | Admitting: Physician Assistant

## 2013-11-05 VITALS — BP 118/72 | HR 75 | Wt 150.0 lb

## 2013-11-05 DIAGNOSIS — R51 Headache: Secondary | ICD-10-CM

## 2013-11-05 DIAGNOSIS — F0781 Postconcussional syndrome: Secondary | ICD-10-CM

## 2013-11-05 DIAGNOSIS — J329 Chronic sinusitis, unspecified: Secondary | ICD-10-CM

## 2013-11-05 DIAGNOSIS — J019 Acute sinusitis, unspecified: Secondary | ICD-10-CM

## 2013-11-05 MED ORDER — METHYLPREDNISOLONE (PAK) 4 MG PO TABS: ORAL_TABLET | ORAL | Status: DC

## 2013-11-05 MED ORDER — CLINDAMYCIN HCL 300 MG PO CAPS
300.0000 mg | ORAL_CAPSULE | Freq: Three times a day (TID) | ORAL | Status: DC
Start: 2013-11-05 — End: 2013-12-03

## 2013-11-05 NOTE — Progress Notes (Signed)
Subjective:    Patient ID: Lynn Lloyd, female    DOB: 1956-02-01, 58 y.o.   MRN: [id number]  HPI Patient is a 58 year old female who presents to the clinic to followup on trauma to her for head. 3 weeks ago she was on vacation and ran head first into a tail gate. She was not completely out and hit the ground. She woke up immediately after hitting the ground. She did not have any significant symptoms until about a week after. For the last 2 weeks she has had headaches off and on. For the last couple of days the pain has been severe. She does so like the headaches are more right-sided. She's been taking a lot of Tylenol 1000 mg L. bid today and it does help. Last night the headache was so severe only relief was to put her head between her legs, pain. She denies any vision changes but does feel like she is training to see. She does feel nauseated. She denies any numbness and tingling into her bilateral hands. She's been to her upper cervical doctor for adjustment last night. It did not seem to help with her headache. She has a lot of sinus pressure but feels like mostly on the right side. She feels swollen on the right side of her jaw. She does have a history of salivary gland infections. She is tender to touch on the right side.   Review of Systems     Objective:   Physical Exam  Constitutional: She is oriented to person, place, and time. She appears well-developed and well-nourished.  HENT:  Head: Normocephalic and atraumatic.  Right Ear: External ear normal.  Left Ear: External ear normal.  Nose: Nose normal.  Mouth/Throat: Oropharynx is clear and moist. No oropharyngeal exudate.  TMs clear bilaterally. Right-sided maxillary pressure and tenderness to palpation. There was some right-sided mucosal tenderness to palpation through the jaw line. No erythema no warmth.  Eyes: Conjunctivae are normal. Right eye exhibits no discharge. Left eye exhibits no discharge.  Neck: Normal range of  motion. Neck supple.  Cardiovascular: Normal rate, regular rhythm and normal heart sounds.   Pulmonary/Chest: Effort normal and breath sounds normal. She has no wheezes.  Lymphadenopathy:    She has no cervical adenopathy.  Neurological: She is alert and oriented to person, place, and time.  Skin: Skin is dry.  Psychiatric: She has a normal mood and affect. Her behavior is normal.          Assessment & Plan:  Headaches/sinus pressure-I am not exactly sure as to what to contribute the headaches today. Does sound suspicious.patient did receive a concussion. Discussed with patient a concussion symptoms can linger for a few weeks out. We'll go ahead and get a CT of the head to confirm any masses, bleeding, acute findings that we should act on. Offered patient a shot of Toradol in office. Patient declined because she is sensitive to medications and concerned. Patient was tender to palpation over the right drawl. I will treat for salivary gland infection as well as sinusitis. Encouraged patient to continue to use ibuprofen regularly for the next couple days. Sometimes lower candy to help move any type of inflammation or blockage from salivary gland. I also did give Medrol Dosepak to help with any sinus inflammation or potential inflammation in the head that could be contributing to headaches. If headache is not resolving please follow her up in the office. She certainly been diligent with treating any type of musculoskeletal  issue with her adjustments. We'll continue to monitor.

## 2013-11-05 NOTE — Patient Instructions (Addendum)
Salivary Gland Infection  A salivary gland infection can be caused by a virus, bacteria from the mouth, or a stone. Mumps and other viruses may settle in one or more of the saliva glands. This will result in swelling, pain, and difficulty eating. Bacteria may cause a more severe infection in a salivary gland. A salivary stone blocking the flow of saliva can make this worse. These infections may be related to other medical problems. Some of these are dehydration, recent surgery, poor nutrition, and some medications.  TREATMENT   Treatment of a salivary gland infection depends on the cause. Mumps and other virus infections do not require antibiotics. If bacteria cause the infection, then antibiotics are needed to get rid of the infection. If there is a salivary stone blocking the duct, minor surgery to remove the stone may be needed.   HOME CARE INSTRUCTIONS    Get plenty of rest, increase your fluids, and use warm compresses on the swollen area for 15 to 20 minutes 4 times per day or as often as feels good to you.   Suck on hard candy or chew sugarless gum to promote saliva production.   Only take over-the-counter or prescription medicines for pain, discomfort, or fever as directed by your caregiver.  SEEK IMMEDIATE MEDICAL CARE IF:    You have increased swelling or pain or pain not relieved with medications.   You develop chills or a fever.   Any of your problems are getting worse rather than better.  Document Released: 09/14/2004 Document Revised: 10/30/2011 Document Reviewed: 08/07/2005  [employer] Patient Information 2014 [employer].

## 2013-11-05 NOTE — Telephone Encounter (Signed)
PA approved for head ct w/o contrast through [employer].

## 2013-11-07 DIAGNOSIS — F0781 Postconcussional syndrome: Secondary | ICD-10-CM | POA: Insufficient documentation

## 2013-12-03 ENCOUNTER — Encounter: Payer: Self-pay | Admitting: Physician Assistant

## 2013-12-03 ENCOUNTER — Ambulatory Visit (INDEPENDENT_AMBULATORY_CARE_PROVIDER_SITE_OTHER): Payer: BC Managed Care – PPO | Admitting: Physician Assistant

## 2013-12-03 VITALS — BP 102/69 | HR 69 | Ht 68.0 in | Wt 148.0 lb

## 2013-12-03 DIAGNOSIS — R195 Other fecal abnormalities: Secondary | ICD-10-CM

## 2013-12-03 DIAGNOSIS — R10814 Left lower quadrant abdominal tenderness: Secondary | ICD-10-CM

## 2013-12-03 DIAGNOSIS — R1084 Generalized abdominal pain: Secondary | ICD-10-CM

## 2013-12-03 MED ORDER — LINACLOTIDE 290 MCG PO CAPS: 290.0000 ug | ORAL_CAPSULE | Freq: Every day | ORAL | Status: DC

## 2013-12-03 NOTE — Progress Notes (Addendum)
   Subjective:    Patient ID: Lynn Lloyd, female    DOB: Jan 08, 1956, 58 y.o.   MRN: [id number]  HPI Patient is a 58 yo female who presents to the clinic with generalized abdominal pain, bloating, mucus in stool. She has a hx of fairly bad cramps and abdominal bloating as times. This certain pain has been going on for a little over a week. Stomach pains are so bad sometimes that she has to breathe through them. She is constipated and not having full bowel movements. When she feels like she has to go to bathroom her poop is full of mucus. Last 3 days of pain has been the worse. Bloating to the point she can not button her pants. Hx of gluten sensivity but has not had gluten. She does admit to not taking her lactobacillis daily like she had been. No one else is home sick. No fever, chills, back pain. With bad cramps she can feel nauseated but no vomiting. Eating seems to make a little worse. Pains and lower and upper abdomen. Denies any reflux symptoms. Pt is leaving to go out of town this weekend and wanted to get checked out before leaving. Trying to keep to a small bland diet currently.     Review of Systems     Objective:   Physical Exam  Constitutional: She is oriented to person, place, and time. She appears well-developed and well-nourished.  HENT:  Head: Normocephalic and atraumatic.  Cardiovascular: Normal rate, regular rhythm and normal heart sounds.   Pulmonary/Chest: Effort normal and breath sounds normal. She has no wheezes.  Abdominal: Soft. Bowel sounds are normal. She exhibits no distension.  Tenderness over entire abdomen to palpation. Increased tenderness over LLQ.  Negative murphys sign.  No rebound or guarding.   Neurological: She is alert and oriented to person, place, and time.  Skin: Skin is dry.  Psychiatric: She has a normal mood and affect. Her behavior is normal.          Assessment & Plan:  Mucus in stool/constipation/generalized abdominal pain/tenderness  in LLQ- Unclear etiology at this point. DDX includes IBS vs Diverticulitis vs IBD vairant vs possible yeast in intestines. Will get CBC to see if we should order CT of abdomen tomorrow, pt has no consitutional symptoms and tender over entire abdomen but no signs of acute abdomen. Gave samples of Linzess for IBS to help have a complete bowel movement as well as treat pain/cramps. Discussed SE of linzess with diarrhea. It could be that bowels are backing up due to constipation. Continue to stay away from gluten and advance diet slowly so not to exacerbate cramps. Will get stool culture to see if any yeast growing in stool and need for antifungal. Follow up as needed or in 2-3 weeks. If develop fever, chills, vomiting. Call office. Pt is due for next colonoscopy in 1 year. If these symptoms continue she should likely have colonoscopy sooner. Will refer.   Spent 30 minutes with patient and greater than 50 percent of visit spent counseling pt regarding DDX.

## 2013-12-03 NOTE — Patient Instructions (Addendum)
Stool culture.  Will get CBC.  Start Linzess.

## 2013-12-03 NOTE — Addendum Note (Signed)
Addended by: Donella Stade on: 12/03/2013 11:49 PM   Modules accepted: Orders

## 2013-12-04 LAB — CBC WITH DIFFERENTIAL/PLATELET
BASOS ABS: 0 10*3/uL (ref 0.0–0.1)
BASOS PCT: 0 % (ref 0–1)
EOS PCT: 2 % (ref 0–5)
Eosinophils Absolute: 0.2 10*3/uL (ref 0.0–0.7)
HCT: 40.3 % (ref 36.0–46.0)
Hemoglobin: 13.8 g/dL (ref 12.0–15.0)
LYMPHS ABS: 2 10*3/uL (ref 0.7–4.0)
Lymphocytes Relative: 20 % (ref 12–46)
MCH: 30.8 pg (ref 26.0–34.0)
MCHC: 34.2 g/dL (ref 30.0–36.0)
MCV: 90 fL (ref 78.0–100.0)
Monocytes Absolute: 1.1 10*3/uL — ABNORMAL HIGH (ref 0.1–1.0)
Monocytes Relative: 11 % (ref 3–12)
NEUTROS PCT: 67 % (ref 43–77)
Neutro Abs: 6.6 10*3/uL (ref 1.7–7.7)
PLATELETS: 293 10*3/uL (ref 150–400)
RBC: 4.48 MIL/uL (ref 3.87–5.11)
RDW: 13.9 % (ref 11.5–15.5)
WBC: 9.8 10*3/uL (ref 4.0–10.5)

## 2013-12-08 LAB — STOOL CULTURE

## 2014-03-17 ENCOUNTER — Ambulatory Visit: Payer: BC Managed Care – PPO | Admitting: Obstetrics & Gynecology

## 2014-04-15 ENCOUNTER — Encounter: Payer: Self-pay | Admitting: Obstetrics & Gynecology

## 2014-04-15 ENCOUNTER — Ambulatory Visit (INDEPENDENT_AMBULATORY_CARE_PROVIDER_SITE_OTHER): Payer: BC Managed Care – PPO | Admitting: Obstetrics & Gynecology

## 2014-04-15 VITALS — BP 116/78 | HR 75 | Resp 16 | Ht 68.0 in | Wt 152.0 lb

## 2014-04-15 DIAGNOSIS — Z01419 Encounter for gynecological examination (general) (routine) without abnormal findings: Secondary | ICD-10-CM

## 2014-04-15 DIAGNOSIS — Z0000 Encounter for general adult medical examination without abnormal findings: Secondary | ICD-10-CM

## 2014-04-15 DIAGNOSIS — Z1151 Encounter for screening for human papillomavirus (HPV): Secondary | ICD-10-CM

## 2014-04-15 DIAGNOSIS — R635 Abnormal weight gain: Secondary | ICD-10-CM | POA: Diagnosis not present

## 2014-04-15 DIAGNOSIS — Z124 Encounter for screening for malignant neoplasm of cervix: Secondary | ICD-10-CM

## 2014-04-15 LAB — CBC
HCT: 42.2 % (ref 36.0–46.0)
Hemoglobin: 14.3 g/dL (ref 12.0–15.0)
MCH: 30.8 pg (ref 26.0–34.0)
MCHC: 33.9 g/dL (ref 30.0–36.0)
MCV: 90.8 fL (ref 78.0–100.0)
PLATELETS: 275 10*3/uL (ref 150–400)
RBC: 4.65 MIL/uL (ref 3.87–5.11)
RDW: 14.1 % (ref 11.5–15.5)
WBC: 6.5 10*3/uL (ref 4.0–10.5)

## 2014-04-15 NOTE — Progress Notes (Addendum)
Subjective:    Lynn Lloyd is a 58 y.o. female who presents for an annual exam. The patient has no complaints today. The patient is sexually active. GYN screening history: last pap: was normal. The patient wears seatbelts: yes. The patient participates in regular exercise: yes. Has the patient ever been transfused or tattooed?: no. The patient reports that there is not domestic violence in her life.   Menstrual History: OB History   Grav Para Term Preterm Abortions TAB SAB Ect Mult Living   3 3 3      1 4       Menarche age: 48  No LMP recorded. Patient is postmenopausal.    The following portions of the patient's history were reviewed and updated as appropriate: allergies, current medications, past family history, past medical history, past social history, past surgical history and problem list.  Review of Systems A comprehensive review of systems was negative. S/P colonoscopy this year and normal. Back with husband of 32 years. Denies dyspareunia. Declines flu vaccine. Needs mammogram.   Objective:    BP 116/78  Pulse 75  Resp 16  Ht 5\' 8"  (1.727 m)  Wt 152 lb (68.947 kg)  BMI 23.12 kg/m2  General Appearance:    Alert, cooperative, no distress, appears stated age  Head:    Normocephalic, without obvious abnormality, atraumatic  Eyes:    PERRL, conjunctiva/corneas clear, EOM's intact, fundi    benign, both eyes  Ears:    Normal TM's and external ear canals, both ears  Nose:   Nares normal, septum midline, mucosa normal, no drainage    or sinus tenderness  Throat:   Lips, mucosa, and tongue normal; teeth and gums normal  Neck:   Supple, symmetrical, trachea midline, no adenopathy;    thyroid:  no enlargement/tenderness/nodules; no carotid   bruit or JVD  Back:     Symmetric, no curvature, ROM normal, no CVA tenderness  Lungs:     Clear to auscultation bilaterally, respirations unlabored  Chest Wall:    No tenderness or deformity   Heart:    Regular rate and rhythm, S1 and  S2 normal, no murmur, rub   or gallop  Breast Exam:    No tenderness, masses, or nipple abnormality  Abdomen:     Soft, non-tender, bowel sounds active all four quadrants,    no masses, no organomegaly  Genitalia:    Normal female without lesion, discharge or tenderness, NSSA, NT, mobile, normal adnexal exam     Extremities:   Extremities normal, atraumatic, no cyanosis or edema  Pulses:   2+ and symmetric all extremities  Skin:   Skin color, texture, turgor normal, no rashes or lesions  Lymph nodes:   Cervical, supraclavicular, and axillary nodes normal  Neurologic:   CNII-XII intact, normal strength, sensation and reflexes    throughout  .    Assessment:    Healthy female exam.  Weight gain   Plan:     Breast self exam technique reviewed and patient encouraged to perform self-exam monthly. Mammogram. Thin prep Pap smear. with cotesting TSH

## 2014-04-16 ENCOUNTER — Telehealth: Payer: Self-pay | Admitting: *Deleted

## 2014-04-16 LAB — COMPREHENSIVE METABOLIC PANEL
ALBUMIN: 4.5 g/dL (ref 3.5–5.2)
ALT: 16 U/L (ref 0–35)
AST: 19 U/L (ref 0–37)
Alkaline Phosphatase: 57 U/L (ref 39–117)
BILIRUBIN TOTAL: 0.5 mg/dL (ref 0.2–1.2)
BUN: 8 mg/dL (ref 6–23)
CHLORIDE: 103 meq/L (ref 96–112)
CO2: 29 meq/L (ref 19–32)
Calcium: 9.1 mg/dL (ref 8.4–10.5)
Creat: 0.69 mg/dL (ref 0.50–1.10)
GLUCOSE: 92 mg/dL (ref 70–99)
POTASSIUM: 4.4 meq/L (ref 3.5–5.3)
Sodium: 140 mEq/L (ref 135–145)
TOTAL PROTEIN: 6.6 g/dL (ref 6.0–8.3)

## 2014-04-16 LAB — LIPID PANEL
Cholesterol: 176 mg/dL (ref 0–200)
HDL: 76 mg/dL (ref >39)
LDL Cholesterol: 89 mg/dL (ref 0–99)
Total CHOL/HDL Ratio: 2.3 Ratio
Triglycerides: 57 mg/dL (ref <150)
VLDL: 11 mg/dL (ref 0–40)

## 2014-04-16 LAB — TSH: TSH: 2.19 u[IU]/mL (ref 0.350–4.500)

## 2014-04-16 LAB — CYTOLOGY - PAP

## 2014-04-16 NOTE — Telephone Encounter (Signed)
Copy of labs mailed to pt and pt notified via phone.

## 2014-04-23 ENCOUNTER — Ambulatory Visit (INDEPENDENT_AMBULATORY_CARE_PROVIDER_SITE_OTHER): Payer: BC Managed Care – PPO

## 2014-04-23 DIAGNOSIS — Z1231 Encounter for screening mammogram for malignant neoplasm of breast: Secondary | ICD-10-CM

## 2014-04-23 DIAGNOSIS — Z0000 Encounter for general adult medical examination without abnormal findings: Secondary | ICD-10-CM

## 2014-06-22 ENCOUNTER — Encounter: Payer: Self-pay | Admitting: Obstetrics & Gynecology

## 2014-08-12 ENCOUNTER — Encounter: Payer: Self-pay | Admitting: Family Medicine

## 2014-08-12 ENCOUNTER — Ambulatory Visit (INDEPENDENT_AMBULATORY_CARE_PROVIDER_SITE_OTHER): Payer: BC Managed Care – PPO | Admitting: Family Medicine

## 2014-08-12 VITALS — BP 137/85 | HR 72 | Temp 98.4°F | Wt 150.0 lb

## 2014-08-12 DIAGNOSIS — J329 Chronic sinusitis, unspecified: Secondary | ICD-10-CM

## 2014-08-12 DIAGNOSIS — A499 Bacterial infection, unspecified: Secondary | ICD-10-CM

## 2014-08-12 DIAGNOSIS — B9689 Other specified bacterial agents as the cause of diseases classified elsewhere: Secondary | ICD-10-CM

## 2014-08-12 MED ORDER — PREDNISONE 20 MG PO TABS: ORAL_TABLET | ORAL | Status: AC

## 2014-08-12 MED ORDER — DOXYCYCLINE HYCLATE 100 MG PO TABS: ORAL_TABLET | ORAL | Status: AC

## 2014-08-12 NOTE — Progress Notes (Signed)
CC: Lynn Lloyd is a 58 y.o. female is here for right ear/jaw pain   Subjective: HPI:  Complains of diffuse body aches of mild severity for the past week. Since Sunday she's also had facial pain localized in the right cheek that radiates into the upper molars. Over the past 48 hours it has begun to radiate into the right ear and has been more discomforting. Symptoms are slightly improved with nonsteroidal anti-inflammatories however temporarily. No benefit for Mucinex. Review of systems positive for subjective postnasal drip, muffled sound in the right ear and nasal congestion. Denies cough, fevers, chills, nor discharge from the ear.denies any other motor or sensory disturbances other than that described above. No shortness of breath or wheezing   Review Of Systems Outlined In HPI  Past Medical History  Diagnosis Date  . Chronic kidney disease   . Hemorrhoid   . Constipation   . Endometriosis     Past Surgical History  Procedure Laterality Date  . Bunionectomy  12/11  . Cholecystectomy    . Tonsillectomy and adenoidectomy    . Laparoscopy      endometriosis  . Salivary gland surgery      removal   Family History  Problem Relation Age of Onset  . Cancer Mother     acl  . Hypertension Mother   . Thyroid disease Mother   . Thyroid disease Father   . Parkinsonism Father     History   Social History  . Marital Status: Married    Spouse Name: N/A    Number of Children: N/A  . Years of Education: N/A   Occupational History  . office manager [employer]   Social History Main Topics  . Smoking status: Never Smoker   . Smokeless tobacco: Never Used  . Alcohol Use: 1.0 oz/week    2 drink(s) per week  . Drug Use: No  . Sexual Activity:    Partners: Male   Other Topics Concern  . Not on file   Social History Narrative     Objective: BP 137/85 mmHg  Pulse 72  Temp(Src) 98.4 F (36.9 C) (Oral)  Wt 150 lb (68.04 kg)  General: Alert and Oriented, No Acute  Distress HEENT: Pupils equal, round, reactive to light. Conjunctivae clear.  External ears unremarkable, canals clear with intact TMs with appropriate landmarks in the left ear however right ear landmarks are lost and appears bulging however middle ear is open..  Middle ear appears open without effusion. Pink inferior turbinates.  Moist mucous membranes, pharynx without inflammation nor lesions  Cobblestoning and postnasal drip.  Neck supple without palpable lymphadenopathy nor abnormal masses. Lungs: Clear to auscultation bilaterally, no wheezing/ronchi/rales.  Comfortable work of breathing. Good air movement. Extremities: No peripheral edema.  Strong peripheral pulses.  Mental Status: No depression, anxiety, nor agitation. Skin: Warm and dry.  Assessment & Plan: Lynn Lloyd was seen today for right ear/jaw pain.  Diagnoses and associated orders for this visit:  Bacterial sinusitis - doxycycline (VIBRA-TABS) 100 MG tablet; One by mouth twice a day for ten days. - predniSONE (DELTASONE) 20 MG tablet; Three tabs at once daily for five days.    Bacterial sinusitis with eustachian tube dysfunction causing right ear pain. Begin doxycycline consider nasal saline washes. If no better in 48 hours start prednisone, she was given a paper copy of prednisone since our office will be closed for the next 4 days.  Return if symptoms worsen or fail to improve.

## 2015-04-20 ENCOUNTER — Ambulatory Visit (INDEPENDENT_AMBULATORY_CARE_PROVIDER_SITE_OTHER): Payer: BC Managed Care – PPO | Admitting: Obstetrics & Gynecology

## 2015-04-20 ENCOUNTER — Encounter: Payer: Self-pay | Admitting: Obstetrics & Gynecology

## 2015-04-20 VITALS — BP 123/78 | HR 67 | Ht 67.0 in | Wt 153.0 lb

## 2015-04-20 DIAGNOSIS — Z124 Encounter for screening for malignant neoplasm of cervix: Secondary | ICD-10-CM

## 2015-04-20 DIAGNOSIS — Z01419 Encounter for gynecological examination (general) (routine) without abnormal findings: Secondary | ICD-10-CM | POA: Diagnosis not present

## 2015-04-20 DIAGNOSIS — Z1151 Encounter for screening for human papillomavirus (HPV): Secondary | ICD-10-CM | POA: Diagnosis not present

## 2015-04-20 DIAGNOSIS — Z0000 Encounter for general adult medical examination without abnormal findings: Secondary | ICD-10-CM

## 2015-04-20 MED ORDER — ESTRADIOL 10 MCG VA TABS: ORAL_TABLET | VAGINAL | Status: DC

## 2015-04-20 MED ORDER — INFLUENZA VAC SPLIT QUAD 0.5 ML IM SUSY: 0.5000 mL | PREFILLED_SYRINGE | INTRAMUSCULAR | Status: DC

## 2015-04-20 NOTE — Progress Notes (Signed)
Subjective:    Lynn Lloyd is a 59 y.o.MW P3 (2 grands)  female who presents for an annual exam. The patient has no complaints today except vaginal dryness The patient is sexually active. GYN screening history: last pap: was normal. The patient wears seatbelts: yes. The patient participates in regular exercise: yes. Has the patient ever been transfused or tattooed?: no. The patient reports that there is not domestic violence in her life.   Menstrual History: OB History    Gravida Para Term Preterm AB TAB SAB Ectopic Multiple Living   3 3 3      1 4       Menarche age: 1  No LMP recorded. Patient is postmenopausal.    The following portions of the patient's history were reviewed and updated as appropriate: allergies, current medications, past family history, past medical history, past social history, past surgical history and problem list.  Review of Systems A comprehensive review of systems was negative. Married for 40 years. Works at [employer]   Objective:    BP 123/78 mmHg  Pulse 67  Ht 5\' 7"  (1.702 m)  Wt 153 lb (69.4 kg)  BMI 23.96 kg/m2  General Appearance:    Alert, cooperative, no distress, appears stated age  Head:    Normocephalic, without obvious abnormality, atraumatic  Eyes:    PERRL, conjunctiva/corneas clear, EOM's intact, fundi    benign, both eyes  Ears:    Normal TM's and external ear canals, both ears  Nose:   Nares normal, septum midline, mucosa normal, no drainage    or sinus tenderness  Throat:   Lips, mucosa, and tongue normal; teeth and gums normal  Neck:   Supple, symmetrical, trachea midline, no adenopathy;    thyroid:  no enlargement/tenderness/nodules; no carotid   bruit or JVD  Back:     Symmetric, no curvature, ROM normal, no CVA tenderness  Lungs:     Clear to auscultation bilaterally, respirations unlabored  Chest Wall:    No tenderness or deformity   Heart:    Regular rate and rhythm, S1 and S2 normal, no murmur, rub   or gallop  Breast  Exam:    No tenderness, masses, or nipple abnormality  Abdomen:     Soft, non-tender, bowel sounds active all four quadrants,    no masses, no organomegaly  Genitalia:    Normal female without lesion, discharge or tenderness, marked atrophy, NSSA, NT, mobile, normal adnexal exam     Extremities:   Extremities normal, atraumatic, no cyanosis or edema  Pulses:   2+ and symmetric all extremities  Skin:   Skin color, texture, turgor normal, no rashes or lesions  Lymph nodes:   Cervical, supraclavicular, and axillary nodes normal  Neurologic:   CNII-XII intact, normal strength, sensation and reflexes    throughout  .    Assessment:    Healthy female exam.   Vaginal atrophy   Plan:     Breast self exam technique reviewed and patient encouraged to perform self-exam monthly. Mammogram. Thin prep Pap smear. with cotesing Fasting labs at her convenience vagifem 2 times per week

## 2015-04-22 ENCOUNTER — Other Ambulatory Visit (INDEPENDENT_AMBULATORY_CARE_PROVIDER_SITE_OTHER): Payer: BC Managed Care – PPO

## 2015-04-22 DIAGNOSIS — Z01419 Encounter for gynecological examination (general) (routine) without abnormal findings: Secondary | ICD-10-CM

## 2015-04-22 LAB — CBC
HEMATOCRIT: 42 % (ref 36.0–46.0)
Hemoglobin: 13.9 g/dL (ref 12.0–15.0)
MCH: 30.8 pg (ref 26.0–34.0)
MCHC: 33.1 g/dL (ref 30.0–36.0)
MCV: 92.9 fL (ref 78.0–100.0)
MPV: 10.6 fL (ref 8.6–12.4)
Platelets: 254 10*3/uL (ref 150–400)
RBC: 4.52 MIL/uL (ref 3.87–5.11)
RDW: 13.8 % (ref 11.5–15.5)
WBC: 6.7 10*3/uL (ref 4.0–10.5)

## 2015-04-22 LAB — CYTOLOGY - PAP

## 2015-04-23 ENCOUNTER — Telehealth: Payer: Self-pay

## 2015-04-23 LAB — LIPID PANEL
CHOL/HDL RATIO: 2.2 ratio (ref <=5.0)
Cholesterol: 191 mg/dL (ref 125–200)
HDL: 86 mg/dL (ref >=46)
LDL Cholesterol: 90 mg/dL (ref <130)
Triglycerides: 77 mg/dL (ref <150)
VLDL: 15 mg/dL (ref <30)

## 2015-04-23 LAB — COMPREHENSIVE METABOLIC PANEL
ALT: 18 U/L (ref 6–29)
AST: 18 U/L (ref 10–35)
Albumin: 4 g/dL (ref 3.6–5.1)
Alkaline Phosphatase: 60 U/L (ref 33–130)
BILIRUBIN TOTAL: 0.6 mg/dL (ref 0.2–1.2)
BUN: 8 mg/dL (ref 7–25)
CALCIUM: 8.9 mg/dL (ref 8.6–10.4)
CO2: 27 mmol/L (ref 20–31)
Chloride: 104 mmol/L (ref 98–110)
Creat: 0.69 mg/dL (ref 0.50–0.99)
GLUCOSE: 95 mg/dL (ref 65–99)
Potassium: 4.9 mmol/L (ref 3.5–5.3)
SODIUM: 139 mmol/L (ref 135–146)
Total Protein: 6.4 g/dL (ref 6.1–8.1)

## 2015-04-23 LAB — VITAMIN D 25 HYDROXY (VIT D DEFICIENCY, FRACTURES): Vit D, 25-Hydroxy: 36 ng/mL (ref 30–100)

## 2015-04-23 LAB — TSH: TSH: 2.365 u[IU]/mL (ref 0.350–4.500)

## 2015-04-23 NOTE — Telephone Encounter (Signed)
Patient called and made aware that labs were all in normal range.  Patient made aware that Vitamin D on lower end of normal and given suggestions on how to increase this. Kathrene Alu RN BSN

## 2015-05-06 ENCOUNTER — Ambulatory Visit (INDEPENDENT_AMBULATORY_CARE_PROVIDER_SITE_OTHER): Payer: BC Managed Care – PPO

## 2015-05-06 ENCOUNTER — Encounter: Payer: Self-pay | Admitting: Family Medicine

## 2015-05-06 ENCOUNTER — Ambulatory Visit (INDEPENDENT_AMBULATORY_CARE_PROVIDER_SITE_OTHER): Payer: BC Managed Care – PPO | Admitting: Family Medicine

## 2015-05-06 VITALS — BP 119/75 | HR 68 | Wt 154.0 lb

## 2015-05-06 DIAGNOSIS — Z1231 Encounter for screening mammogram for malignant neoplasm of breast: Secondary | ICD-10-CM | POA: Diagnosis not present

## 2015-05-06 DIAGNOSIS — M25561 Pain in right knee: Secondary | ICD-10-CM

## 2015-05-06 DIAGNOSIS — M25562 Pain in left knee: Secondary | ICD-10-CM | POA: Diagnosis not present

## 2015-05-06 DIAGNOSIS — Z0000 Encounter for general adult medical examination without abnormal findings: Secondary | ICD-10-CM

## 2015-05-06 NOTE — Patient Instructions (Signed)
Thank you for coming in today. Return in 4 weeks.,  Continue ibuprofen or tylenol  Call or go to the ER if you develop a large red swollen joint with extreme pain or oozing puss.

## 2015-05-07 NOTE — Progress Notes (Signed)
Subjective:    I'm seeing this patient as a consultation for:  Dr. Madilyn Fireman  CC: Right knee pain  HPI: Right knee pain. Patient has a several year history of right knee pain occurring off and on. Pain is located predominantly the anterior aspect of the knee on the medial aspect of the knee. She was doing reasonably well until recently when she developed pain worsening in the anterior medial knee. This occurred about 3 weeks ago. Patient denies any injury. She notes minimal joint swelling. She denies any popping or locking or catching. No radiating pain weakness or numbness fevers or chills. She's tried some over-the-counter medications and topical treatments which helped a little.  Past medical history, Surgical history, Family history not pertinant except as noted below, Social history, Allergies, and medications have been entered into the medical record, reviewed, and no changes needed.   Review of Systems: No headache, visual changes, nausea, vomiting, diarrhea, constipation, dizziness, abdominal pain, skin rash, fevers, chills, night sweats, weight loss, swollen lymph nodes, body aches, joint swelling, muscle aches, chest pain, shortness of breath, mood changes, visual or auditory hallucinations.   Objective:    Filed Vitals:   05/06/15 1536  BP: 119/75  Pulse: 68   General: Well Developed, well nourished, and in no acute distress.  Neuro/Psych: Alert and oriented x3, extra-ocular muscles intact, able to move all 4 extremities, sensation grossly intact. Skin: Warm and dry, no rashes noted.  Respiratory: Not using accessory muscles, speaking in full sentences, trachea midline.  Cardiovascular: Pulses palpable, no extremity edema. Abdomen: Does not appear distended. MSK: Right knee normal-appearing no significant effusion. Minimally tender medial joint line. Range of motion 0-120 with 1+ retropatellar crepitations on extension. Strength intact to flexion and extension Stable  ligamentous exam to valgus and varus stress. Laxity with anterior drawer sign compared to the contralateral left knee. No firm endpoint noted. Negative medial McMurray's test. Left knee normal-appearing no effusion Range of motion 0-120 with 1+ retropatellar crepitations on extension. Extension and flexion strength are intact. Stable ligamentous exam firm endpoint on anterior drawer. Negative McMurray's test.   Procedure: Real-time Ultrasound Guided Injection of right knee  Device: GE Logiq E  Images permanently stored and available for review in the ultrasound unit. Verbal informed consent obtained. Discussed risks and benefits of procedure. Warned about infection bleeding damage to structures skin hypopigmentation and fat atrophy among others. Patient expresses understanding and agreement Time-out conducted.  Noted no overlying erythema, induration, or other signs of local infection.  Skin prepped in a sterile fashion.  Local anesthesia: Topical Ethyl chloride.  With sterile technique and under real time ultrasound guidance: 40 mg Kenalog and 4 mL Marcaine injected easily.  Completed without difficulty  Pain immediately resolved suggesting accurate placement of the medication.  Advised to call if fevers/chills, erythema, induration, drainage, or persistent bleeding.  Images permanently stored and available for review in the ultrasound unit.  Impression: Technically successful ultrasound guided injection.     No results found for this or any previous visit (from the past 24 hour(s)). Dg Knee 1-2 Views Left  05/06/2015   ADDENDUM REPORT: 05/06/2015 19:28 ADDENDUM: The original report was incorrectly dictated. The following is the corrected report: CLINICAL DATA: Right knee pain for 3 months EXAM: LEFT KNEE - 1-2 VIEW COMPARISON: None FINDINGS: There is moderate narrowing of the medial compartment of the left knee. No fracture. No dislocation. There is no lateral view of the  left knee limiting the study. AP and oblique  views are submitted. IMPRESSION: No acute bony pathology. Mild degenerative change. Electronically Signed   By: Marybelle Killings M.D.   On: 05/06/2015 19:28  05/06/2015   CLINICAL DATA:  Right knee pain for 3 months  EXAM: LEFT KNEE - 1-2 VIEW  COMPARISON:  None  FINDINGS: Very minimal narrowing of the medial compartment of the right knee. There is slightly more narrowing of the medial compartment of the left knee. No significant osteophyte formation involving the medial or lateral knee compartments. Minimal osteophyte formation about the patellofemoral joint. No fracture. No dislocation. Anatomic alignment. There is a small bony density lateral to the patella on the sunrise view. Loose body is not excluded.  IMPRESSION: No acute bony pathology.  Mild degenerative change.  Possible loose body in the joint as described above.  Electronically Signed: By: Marybelle Killings M.D. On: 05/06/2015 18:55   Dg Knee Complete 4 Views Right  05/06/2015   CLINICAL DATA:  Right knee pain.  EXAM: RIGHT KNEE - COMPLETE 4+ VIEW  COMPARISON:  None.  FINDINGS: Very minimal narrowing of the medial compartment of the right knee. There is slightly more narrowing of the medial compartment of the left knee. No significant osteophyte formation involving the medial or lateral knee compartments. Minimal osteophyte formation about the patello-femoral joint. No fracture. No dislocation. Anatomic alignment. There is a small bony density lateral to the patella on the sunrise view. Loose body is not excluded.  IMPRESSION: No acute bony pathology. Mild degenerative change. Possible loose body in the joint as described above.   Electronically Signed   By: Marybelle Killings M.D.   On: 05/06/2015 19:26    Impression and Recommendations:   This case required medical decision making of moderate complexity.

## 2015-05-07 NOTE — Assessment & Plan Note (Signed)
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 She may also have a degenerative meniscus tear. Additionally on exam today she had some laxity with her anterior cruciate ligament testing. She may have an old anterior cruciate ligament tear however with her current level of activity we can wean and evaluation of this problem.  Plan injection as above followed by home NSAIDs and watchful waiting. If not better will return to clinic. We'll obtain an MRI in the near future if no better to evaluate for anterior cruciate ligament tear, degenerative meniscus tear, loose bodies, and OCD lesions.

## 2015-05-07 NOTE — Progress Notes (Signed)
Quick Note:  Xray right knee shows mild arthritis. Continue plan. Xray left knee shows arthritis with a possible loose body. No need to worry about it. ______

## 2015-06-03 ENCOUNTER — Ambulatory Visit: Payer: BC Managed Care – PPO | Admitting: Family Medicine

## 2016-03-02 ENCOUNTER — Ambulatory Visit (INDEPENDENT_AMBULATORY_CARE_PROVIDER_SITE_OTHER): Payer: BC Managed Care – PPO | Admitting: Family Medicine

## 2016-03-02 ENCOUNTER — Ambulatory Visit (INDEPENDENT_AMBULATORY_CARE_PROVIDER_SITE_OTHER): Payer: BC Managed Care – PPO

## 2016-03-02 ENCOUNTER — Encounter: Payer: Self-pay | Admitting: Family Medicine

## 2016-03-02 VITALS — BP 118/83 | HR 79

## 2016-03-02 DIAGNOSIS — M25511 Pain in right shoulder: Secondary | ICD-10-CM | POA: Diagnosis not present

## 2016-03-02 NOTE — Progress Notes (Signed)
Quick Note:  Normal x-ray ______

## 2016-03-02 NOTE — Patient Instructions (Signed)
Thank you for coming in today. Return in 4 weeks.  Attend PT.  Avoid overhead lifting where your hands are not visible in your peripheral vision.   Impingement Syndrome, Rotator Cuff, Bursitis With Rehab Impingement syndrome is a condition that involves inflammation of the tendons of the rotator cuff and the subacromial bursa, that causes pain in the shoulder. The rotator cuff consists of four tendons and muscles that control much of the shoulder and upper arm function. The subacromial bursa is a fluid filled sac that helps reduce friction between the rotator cuff and one of the bones of the shoulder (acromion). Impingement syndrome is usually an overuse injury that causes swelling of the bursa (bursitis), swelling of the tendon (tendonitis), and/or a tear of the tendon (strain). Strains are classified into three categories. Grade 1 strains cause pain, but the tendon is not lengthened. Grade 2 strains include a lengthened ligament, due to the ligament being stretched or partially ruptured. With grade 2 strains there is still function, although the function may be decreased. Grade 3 strains include a complete tear of the tendon or muscle, and function is usually impaired. SYMPTOMS   Pain around the shoulder, often at the outer portion of the upper arm.  Pain that gets worse with shoulder function, especially when reaching overhead or lifting.  Sometimes, aching when not using the arm.  Pain that wakes you up at night.  Sometimes, tenderness, swelling, warmth, or redness over the affected area.  Loss of strength.  Limited motion of the shoulder, especially reaching behind the back (to the back pocket or to unhook bra) or across your body.  Crackling sound (crepitation) when moving the arm.  Biceps tendon pain and inflammation (in the front of the shoulder). Worse when bending the elbow or lifting. CAUSES  Impingement syndrome is often an overuse injury, in which chronic (repetitive) motions  cause the tendons or bursa to become inflamed. A strain occurs when a force is paced on the tendon or muscle that is greater than it can withstand. Common mechanisms of injury include: Stress from sudden increase in duration, frequency, or intensity of training.  Direct hit (trauma) to the shoulder.  Aging, erosion of the tendon with normal use.  Bony bump on shoulder (acromial spur). RISK INCREASES WITH:  Contact sports (football, wrestling, boxing).  Throwing sports (baseball, tennis, volleyball).  Weightlifting and bodybuilding.  Heavy labor.  Previous injury to the rotator cuff, including impingement.  Poor shoulder strength and flexibility.  Failure to warm up properly before activity.  Inadequate protective equipment.  Old age.  Bony bump on shoulder (acromial spur). PREVENTION   Warm up and stretch properly before activity.  Allow for adequate recovery between workouts.  Maintain physical fitness:  Strength, flexibility, and endurance.  Cardiovascular fitness.  Learn and use proper exercise technique. PROGNOSIS  If treated properly, impingement syndrome usually goes away within 6 weeks. Sometimes surgery is required.  RELATED COMPLICATIONS   Longer healing time if not properly treated, or if not given enough time to heal.  Recurring symptoms, that result in a chronic condition.  Shoulder stiffness, frozen shoulder, or loss of motion.  Rotator cuff tendon tear.  Recurring symptoms, especially if activity is resumed too soon, with overuse, with a direct blow, or when using poor technique. TREATMENT  Treatment first involves the use of ice and medicine, to reduce pain and inflammation. The use of strengthening and stretching exercises may help reduce pain with activity. These exercises may be performed at home  or with a therapist. If non-surgical treatment is unsuccessful after more than 6 months, surgery may be advised. After surgery and rehabilitation,  activity is usually possible in 3 months.  MEDICATION  If pain medicine is needed, nonsteroidal anti-inflammatory medicines (aspirin and ibuprofen), or other minor pain relievers (acetaminophen), are often advised.  Do not take pain medicine for 7 days before surgery.  Prescription pain relievers may be given, if your caregiver thinks they are needed. Use only as directed and only as much as you need.  Corticosteroid injections may be given by your caregiver. These injections should be reserved for the most serious cases, because they may only be given a certain number of times. HEAT AND COLD  Cold treatment (icing) should be applied for 10 to 15 minutes every 2 to 3 hours for inflammation and pain, and immediately after activity that aggravates your symptoms. Use ice packs or an ice massage.  Heat treatment may be used before performing stretching and strengthening activities prescribed by your caregiver, physical therapist, or athletic trainer. Use a heat pack or a warm water soak. SEEK MEDICAL CARE IF:   Symptoms get worse or do not improve in 4 to 6 weeks, despite treatment.  New, unexplained symptoms develop. (Drugs used in treatment may produce side effects.) EXERCISES  RANGE OF MOTION (ROM) AND STRETCHING EXERCISES - Impingement Syndrome (Rotator Cuff  Tendinitis, Bursitis) These exercises may help you when beginning to rehabilitate your injury. Your symptoms may go away with or without further involvement from your physician, physical therapist or athletic trainer. While completing these exercises, remember:   Restoring tissue flexibility helps normal motion to return to the joints. This allows healthier, less painful movement and activity.  An effective stretch should be held for at least 30 seconds.  A stretch should never be painful. You should only feel a gentle lengthening or release in the stretched tissue. STRETCH - Flexion, Standing  Stand with good posture. With an  underhand grip on your right / left hand, and an overhand grip on the opposite hand, grasp a broomstick or cane so that your hands are a little more than shoulder width apart.  Keeping your right / left elbow straight and shoulder muscles relaxed, push the stick with your opposite hand, to raise your right / left arm in front of your body and then overhead. Raise your arm until you feel a stretch in your right / left shoulder, but before you have increased shoulder pain.  Try to avoid shrugging your right / left shoulder as your arm rises, by keeping your shoulder blade tucked down and toward your mid-back spine. Hold for __________ seconds.  Slowly return to the starting position. Repeat __________ times. Complete this exercise __________ times per day. STRETCH - Abduction, Supine  Lie on your back. With an underhand grip on your right / left hand and an overhand grip on the opposite hand, grasp a broomstick or cane so that your hands are a little more than shoulder width apart.  Keeping your right / left elbow straight and your shoulder muscles relaxed, push the stick with your opposite hand, to raise your right / left arm out to the side of your body and then overhead. Raise your arm until you feel a stretch in your right / left shoulder, but before you have increased shoulder pain.  Try to avoid shrugging your right / left shoulder as your arm rises, by keeping your shoulder blade tucked down and toward your mid-back spine.  Hold for __________ seconds.  Slowly return to the starting position. Repeat __________ times. Complete this exercise __________ times per day. ROM - Flexion, Active-Assisted  Lie on your back. You may bend your knees for comfort.  Grasp a broomstick or cane so your hands are about shoulder width apart. Your right / left hand should grip the end of the stick, so that your hand is positioned "thumbs-up," as if you were about to shake hands.  Using your healthy arm to  lead, raise your right / left arm overhead, until you feel a gentle stretch in your shoulder. Hold for __________ seconds.  Use the stick to assist in returning your right / left arm to its starting position. Repeat __________ times. Complete this exercise __________ times per day.  ROM - Internal Rotation, Supine   Lie on your back on a firm surface. Place your right / left elbow about 60 degrees away from your side. Elevate your elbow with a folded towel, so that the elbow and shoulder are the same height.  Using a broomstick or cane and your strong arm, pull your right / left hand toward your body until you feel a gentle stretch, but no increase in your shoulder pain. Keep your shoulder and elbow in place throughout the exercise.  Hold for __________ seconds. Slowly return to the starting position. Repeat __________ times. Complete this exercise __________ times per day. STRETCH - Internal Rotation  Place your right / left hand behind your back, palm up.  Throw a towel or belt over your opposite shoulder. Grasp the towel with your right / left hand.  While keeping an upright posture, gently pull up on the towel, until you feel a stretch in the front of your right / left shoulder.  Avoid shrugging your right / left shoulder as your arm rises, by keeping your shoulder blade tucked down and toward your mid-back spine.  Hold for __________ seconds. Release the stretch, by lowering your healthy hand. Repeat __________ times. Complete this exercise __________ times per day. ROM - Internal Rotation   Using an underhand grip, grasp a stick behind your back with both hands.  While standing upright with good posture, slide the stick up your back until you feel a mild stretch in the front of your shoulder.  Hold for __________ seconds. Slowly return to your starting position. Repeat __________ times. Complete this exercise __________ times per day.  STRETCH - Posterior Shoulder Capsule    Stand or sit with good posture. Grasp your right / left elbow and draw it across your chest, keeping it at the same height as your shoulder.  Pull your elbow, so your upper arm comes in closer to your chest. Pull until you feel a gentle stretch in the back of your shoulder.  Hold for __________ seconds. Repeat __________ times. Complete this exercise __________ times per day. STRENGTHENING EXERCISES - Impingement Syndrome (Rotator Cuff Tendinitis, Bursitis) These exercises may help you when beginning to rehabilitate your injury. They may resolve your symptoms with or without further involvement from your physician, physical therapist or athletic trainer. While completing these exercises, remember:  Muscles can gain both the endurance and the strength needed for everyday activities through controlled exercises.  Complete these exercises as instructed by your physician, physical therapist or athletic trainer. Increase the resistance and repetitions only as guided.  You may experience muscle soreness or fatigue, but the pain or discomfort you are trying to eliminate should never worsen during these exercises. If  this pain does get worse, stop and make sure you are following the directions exactly. If the pain is still present after adjustments, discontinue the exercise until you can discuss the trouble with your clinician.  During your recovery, avoid activity or exercises which involve actions that place your injured hand or elbow above your head or behind your back or head. These positions stress the tissues which you are trying to heal. STRENGTH - Scapular Depression and Adduction   With good posture, sit on a firm chair. Support your arms in front of you, with pillows, arm rests, or on a table top. Have your elbows in line with the sides of your body.  Gently draw your shoulder blades down and toward your mid-back spine. Gradually increase the tension, without tensing the muscles along the  top of your shoulders and the back of your neck.  Hold for __________ seconds. Slowly release the tension and relax your muscles completely before starting the next repetition.  After you have practiced this exercise, remove the arm support and complete the exercise in standing as well as sitting position. Repeat __________ times. Complete this exercise __________ times per day.  STRENGTH - Shoulder Abductors, Isometric  With good posture, stand or sit about 4-6 inches from a wall, with your right / left side facing the wall.  Bend your right / left elbow. Gently press your right / left elbow into the wall. Increase the pressure gradually, until you are pressing as hard as you can, without shrugging your shoulder or increasing any shoulder discomfort.  Hold for __________ seconds.  Release the tension slowly. Relax your shoulder muscles completely before you begin the next repetition. Repeat __________ times. Complete this exercise __________ times per day.  STRENGTH - External Rotators, Isometric  Keep your right / left elbow at your side and bend it 90 degrees.  Step into a door frame so that the outside of your right / left wrist can press against the door frame without your upper arm leaving your side.  Gently press your right / left wrist into the door frame, as if you were trying to swing the back of your hand away from your stomach. Gradually increase the tension, until you are pressing as hard as you can, without shrugging your shoulder or increasing any shoulder discomfort.  Hold for __________ seconds.  Release the tension slowly. Relax your shoulder muscles completely before you begin the next repetition. Repeat __________ times. Complete this exercise __________ times per day.  STRENGTH - Supraspinatus   Stand or sit with good posture. Grasp a __________ weight, or an exercise band or tubing, so that your hand is "thumbs-up," like you are shaking hands.  Slowly lift your  right / left arm in a "V" away from your thigh, diagonally into the space between your side and straight ahead. Lift your hand to shoulder height or as far as you can, without increasing any shoulder pain. At first, many people do not lift their hands above shoulder height.  Avoid shrugging your right / left shoulder as your arm rises, by keeping your shoulder blade tucked down and toward your mid-back spine.  Hold for __________ seconds. Control the descent of your hand, as you slowly return to your starting position. Repeat __________ times. Complete this exercise __________ times per day.  STRENGTH - External Rotators  Secure a rubber exercise band or tubing to a fixed object (table, pole) so that it is at the same height as your right /  left elbow when you are standing or sitting on a firm surface.  Stand or sit so that the secured exercise band is at your uninjured side.  Bend your right / left elbow 90 degrees. Place a folded towel or small pillow under your right / left arm, so that your elbow is a few inches away from your side.  Keeping the tension on the exercise band, pull it away from your body, as if pivoting on your elbow. Be sure to keep your body steady, so that the movement is coming only from your rotating shoulder.  Hold for __________ seconds. Release the tension in a controlled manner, as you return to the starting position. Repeat __________ times. Complete this exercise __________ times per day.  STRENGTH - Internal Rotators   Secure a rubber exercise band or tubing to a fixed object (table, pole) so that it is at the same height as your right / left elbow when you are standing or sitting on a firm surface.  Stand or sit so that the secured exercise band is at your right / left side.  Bend your elbow 90 degrees. Place a folded towel or small pillow under your right / left arm so that your elbow is a few inches away from your side.  Keeping the tension on the exercise  band, pull it across your body, toward your stomach. Be sure to keep your body steady, so that the movement is coming only from your rotating shoulder.  Hold for __________ seconds. Release the tension in a controlled manner, as you return to the starting position. Repeat __________ times. Complete this exercise __________ times per day.  STRENGTH - Scapular Protractors, Standing   Stand arms length away from a wall. Place your hands on the wall, keeping your elbows straight.  Begin by dropping your shoulder blades down and toward your mid-back spine.  To strengthen your protractors, keep your shoulder blades down, but slide them forward on your rib cage. It will feel as if you are lifting the back of your rib cage away from the wall. This is a subtle motion and can be challenging to complete. Ask your caregiver for further instruction, if you are not sure you are doing the exercise correctly.  Hold for __________ seconds. Slowly return to the starting position, resting the muscles completely before starting the next repetition. Repeat __________ times. Complete this exercise __________ times per day. STRENGTH - Scapular Protractors, Supine  Lie on your back on a firm surface. Extend your right / left arm straight into the air while holding a __________ weight in your hand.  Keeping your head and back in place, lift your shoulder off the floor.  Hold for __________ seconds. Slowly return to the starting position, and allow your muscles to relax completely before starting the next repetition. Repeat __________ times. Complete this exercise __________ times per day. STRENGTH - Scapular Protractors, Quadruped  Get onto your hands and knees, with your shoulders directly over your hands (or as close as you can be, comfortably).  Keeping your elbows locked, lift the back of your rib cage up into your shoulder blades, so your mid-back rounds out. Keep your neck muscles relaxed.  Hold this  position for __________ seconds. Slowly return to the starting position and allow your muscles to relax completely before starting the next repetition. Repeat __________ times. Complete this exercise __________ times per day.  STRENGTH - Scapular Retractors  Secure a rubber exercise band or tubing to a  fixed object (table, pole), so that it is at the height of your shoulders when you are either standing, or sitting on a firm armless chair.  With a palm down grip, grasp an end of the band in each hand. Straighten your elbows and lift your hands straight in front of you, at shoulder height. Step back, away from the secured end of the band, until it becomes tense.  Squeezing your shoulder blades together, draw your elbows back toward your sides, as you bend them. Keep your upper arms lifted away from your body throughout the exercise.  Hold for __________ seconds. Slowly ease the tension on the band, as you reverse the directions and return to the starting position. Repeat __________ times. Complete this exercise __________ times per day. STRENGTH - Shoulder Extensors   Secure a rubber exercise band or tubing to a fixed object (table, pole) so that it is at the height of your shoulders when you are either standing, or sitting on a firm armless chair.  With a thumbs-up grip, grasp an end of the band in each hand. Straighten your elbows and lift your hands straight in front of you, at shoulder height. Step back, away from the secured end of the band, until it becomes tense.  Squeezing your shoulder blades together, pull your hands down to the sides of your thighs. Do not allow your hands to go behind you.  Hold for __________ seconds. Slowly ease the tension on the band, as you reverse the directions and return to the starting position. Repeat __________ times. Complete this exercise __________ times per day.  STRENGTH - Scapular Retractors and External Rotators   Secure a rubber exercise band or  tubing to a fixed object (table, pole) so that it is at the height as your shoulders, when you are either standing, or sitting on a firm armless chair.  With a palm down grip, grasp an end of the band in each hand. Bend your elbows 90 degrees and lift your elbows to shoulder height, at your sides. Step back, away from the secured end of the band, until it becomes tense.  Squeezing your shoulder blades together, rotate your shoulders so that your upper arms and elbows remain stationary, but your fists travel upward to head height.  Hold for __________ seconds. Slowly ease the tension on the band, as you reverse the directions and return to the starting position. Repeat __________ times. Complete this exercise __________ times per day.  STRENGTH - Scapular Retractors and External Rotators, Rowing   Secure a rubber exercise band or tubing to a fixed object (table, pole) so that it is at the height of your shoulders, when you are either standing, or sitting on a firm armless chair.  With a palm down grip, grasp an end of the band in each hand. Straighten your elbows and lift your hands straight in front of you, at shoulder height. Step back, away from the secured end of the band, until it becomes tense.  Step 1: Squeeze your shoulder blades together. Bending your elbows, draw your hands to your chest, as if you are rowing a boat. At the end of this motion, your hands and elbow should be at shoulder height and your elbows should be out to your sides.  Step 2: Rotate your shoulders, to raise your hands above your head. Your forearms should be vertical and your upper arms should be horizontal.  Hold for __________ seconds. Slowly ease the tension on the band, as you  reverse the directions and return to the starting position. Repeat __________ times. Complete this exercise __________ times per day.  STRENGTH - Scapular Depressors  Find a sturdy chair without wheels, such as a dining room  chair.  Keeping your feet on the floor, and your hands on the chair arms, lift your bottom up from the seat, and lock your elbows.  Keeping your elbows straight, allow gravity to pull your body weight down. Your shoulders will rise toward your ears.  Raise your body against gravity by drawing your shoulder blades down your back, shortening the distance between your shoulders and ears. Although your feet should always maintain contact with the floor, your feet should progressively support less body weight, as you get stronger.  Hold for __________ seconds. In a controlled and slow manner, lower your body weight to begin the next repetition. Repeat __________ times. Complete this exercise __________ times per day.    This information is not intended to replace advice given to you by your health care provider. Make sure you discuss any questions you have with your health care provider.   Document Released: 08/07/2005 Document Revised: 08/28/2014 Document Reviewed: 11/19/2008 [employer] Interactive Patient Education [employer].

## 2016-03-02 NOTE — Progress Notes (Signed)
   Subjective:    I'm seeing this patient as a consultation for:  Dr. Madilyn Fireman  CC: Right arm pain  HPI: 60 year-old female with a history left sided cervical radiculopathy presents with right arm pain.  The pain has been ongoing for the past month.  She describes it as a soreness in her right upper arm that is worse with general movement and relieved with ibuprofen.  She claims the pain wakes her up from sleep and that she has difficulty getting comfortable.  She denies specific movements that exacerbate her pain.  Patient recently began Body Pump but stopped this past week due to her pain.  Denies known injury.  She denies numbness, tingling, and weakness of her extremities.  The pain is not similar to her left cervical radiculopathy.    Past medical history, Surgical history, Family history not pertinant except as noted below, Social history, Allergies, and medications have been entered into the medical record, reviewed, and no changes needed.   Review of Systems: No headache, nausea, vomiting, diarrhea, dizziness, abdominal pain, fevers, chills, body aches, joint swelling, chest pain, or shortness of breath.  Objective:    Filed Vitals:   03/02/16 0827  BP: 118/83  Pulse: 79   General: Well Developed, well nourished, and in no acute distress.  Neuro/Psych: Alert and oriented x3, able to move all 4 extremities, sensation grossly intact. Skin: Warm and dry, no rashes noted.  Respiratory: Not using accessory muscles, speaking in full sentences, trachea midline.  Abdomen: Does not appear distended. MSK: Full range of motion of the neck. No neck tenderness Right shoulder:   Normal appearance Full range of motion with mild pain.   No specific areas of tenderness.   Pain with lift off test.  Negative impingement testing.   Strength 5/5.   Pulses capillary refill and sensation are intact bilateral upper extremities.     Right shoulder Xray: AP, scapular, and axillary views:  negative  Impression and Recommendations:    Assessment and Plan: 60 y.o. female with likely rotator cuff strain Of the subscapularis muscle and tendon based on quality of pain and lack of radicular or neurologic findings.  Symptoms do not appear to be radicular in nature or similar to her previous cervical radiculopathy.   - Plan to set up PT referral, use warm compress, and advil as needed.  Avoid activities that exacerbate pain.   - F/u in 4 weeks if not improved

## 2016-03-14 ENCOUNTER — Ambulatory Visit: Payer: BC Managed Care – PPO | Admitting: Physical Therapy

## 2016-03-23 ENCOUNTER — Ambulatory Visit: Payer: BC Managed Care – PPO | Admitting: Physical Therapy

## 2016-03-29 ENCOUNTER — Ambulatory Visit: Payer: BC Managed Care – PPO | Admitting: Rehabilitative and Restorative Service Providers"

## 2016-04-03 ENCOUNTER — Ambulatory Visit: Payer: BC Managed Care – PPO | Admitting: Family Medicine

## 2016-05-18 ENCOUNTER — Encounter: Payer: Self-pay | Admitting: Osteopathic Medicine

## 2016-05-18 ENCOUNTER — Ambulatory Visit (INDEPENDENT_AMBULATORY_CARE_PROVIDER_SITE_OTHER): Payer: BC Managed Care – PPO | Admitting: Osteopathic Medicine

## 2016-05-18 VITALS — BP 107/70 | HR 72 | Ht 68.0 in | Wt 145.0 lb

## 2016-05-18 DIAGNOSIS — R350 Frequency of micturition: Secondary | ICD-10-CM | POA: Diagnosis not present

## 2016-05-18 DIAGNOSIS — N3090 Cystitis, unspecified without hematuria: Secondary | ICD-10-CM

## 2016-05-18 LAB — POCT URINALYSIS DIPSTICK
BILIRUBIN UA: NEGATIVE
GLUCOSE UA: NEGATIVE
Ketones, UA: NEGATIVE
Nitrite, UA: NEGATIVE
Protein, UA: NEGATIVE
SPEC GRAV UA: 1.01
Urobilinogen, UA: 0.2
pH, UA: 7

## 2016-05-18 MED ORDER — SULFAMETHOXAZOLE-TRIMETHOPRIM 800-160 MG PO TABS: 1.0000 | ORAL_TABLET | Freq: Two times a day (BID) | ORAL | 0 refills | Status: DC

## 2016-05-18 NOTE — Progress Notes (Signed)
Chief Complaint: Possible UTI  History of Present Illness: Lynn Lloyd is a 60 y.o. female who presents to [facility]  today with concerns for Chief Complaint  Patient presents with  . Urinary Frequency    Onset: 1day  Quality: Burning/Urgency Associated Symptoms: see ROS below Context:  Previous UTI: many years  Recurrent UTI (3 times/more annually): no  Abx in past 3 months: no  Complicated (any of the following): no ?Diabetes ?Pregnancy ?Symptoms for seven or more days before seeking care ?Hospital acquired infection ?Renal failure ?Urinary tract obstruction ?Indwelling urethral catheter, stent, nephrostomy tube or urinary diversion ?Functional/anatomic abnormality of the urinary tract ?Renal transplantation ?Immunosuppression   Past medical, social and family history reviewed: Past Medical History:  Diagnosis Date  . Chronic kidney disease   . Constipation   . Endometriosis   . Hemorrhoid    Past Surgical History:  Procedure Laterality Date  . BUNIONECTOMY  12/11  . CHOLECYSTECTOMY    . LAPAROSCOPY     endometriosis  . SALIVARY GLAND SURGERY     removal  . TONSILLECTOMY AND ADENOIDECTOMY     Social History  Substance Use Topics  . Smoking status: Never Smoker  . Smokeless tobacco: Never Used  . Alcohol use 1.0 oz/week    2 Standard drinks or equivalent per week   The patient has a family history of  Current Outpatient Prescriptions  Medication Sig Dispense Refill  . Multiple Vitamin (MULTIVITAMIN) tablet Take 1 tablet by mouth daily.     No current facility-administered medications for this visit.    Allergies  Allergen Reactions  . Amoxicillin-Pot Clavulanate   . Codeine   . Latex Other (See Comments)    whelps     Review of Systems: CONSTITUTIONAL: Negative fever/chills CARDIAC: No chest pain/pressure/palpitations, no orthopnea RESPIRATORY: No cough/shortness of breath/wheeze GASTROINTESTINAL: No  nausea/vomiting/abdominal pain/blood in stool/diarrhea/constipation MUSCULOSKELETAL: see below re: flank pain GENITOURINARY:    Frequency: yes  Hematuria: no  Odor: no  Incontinence: no  Flank Pain: no  Vaginal bleeding/discharge: no   Exam:  BP 107/70   Pulse 72   Ht 5\' 8"  (1.727 m)   Wt 145 lb (65.8 kg)   BMI 22.05 kg/m  Constitutional: VSS, see above. General Appearance: alert, well-developed, well-nourished, NAD Respiratory: Normal respiratory effort. Breath sounds normal, no wheeze/rhonchi/rales Cardiovascular: S1/S2 normal, no murmur/rub/gallop auscultated. RRR Gastrointestinal: Nontender, no masses. No hepatomegaly, no splenomegaly. No hernia appreciated. Rectal exam deferred.  Musculoskeletal: Gait normal. No clubbing/cyanosis of digits. Lloyd sign Negative bilateral  Results for orders placed or performed in visit on 05/18/16 (from the past 24 hour(s))  POCT Urinalysis Dipstick     Status: Abnormal   Collection Time: 05/18/16 10:56 AM  Result Value Ref Range   Color, UA YELLOW    Clarity, UA CLEAR    Glucose, UA NEGATIVE    Bilirubin, UA NEGATIVE    Ketones, UA NEGATIVE    Spec Grav, UA 1.010    Blood, UA TRACE    pH, UA 7.0    Protein, UA NEGATIVE    Urobilinogen, UA 0.2    Nitrite, UA NEGATIVE    Leukocytes, UA moderate (2+) (A) Negative   Previous Culture Results: neg 2013   ASSESSMENT/PLAN:   Cystitis - Plan: sulfamethoxazole-trimethoprim (BACTRIM DS) 800-160 MG tablet  Frequency of urination - Plan: POCT Urinalysis Dipstick, Urine Culture   Patient advised we will call with urine culture results once available, depending on results may need  to change therapy. No Follow-up on file.

## 2016-05-20 LAB — URINE CULTURE: Organism ID, Bacteria: NO GROWTH

## 2016-05-30 ENCOUNTER — Encounter: Payer: Self-pay | Admitting: Obstetrics & Gynecology

## 2016-05-30 ENCOUNTER — Ambulatory Visit (INDEPENDENT_AMBULATORY_CARE_PROVIDER_SITE_OTHER): Payer: BC Managed Care – PPO | Admitting: Obstetrics & Gynecology

## 2016-05-30 ENCOUNTER — Ambulatory Visit (INDEPENDENT_AMBULATORY_CARE_PROVIDER_SITE_OTHER): Payer: BC Managed Care – PPO

## 2016-05-30 VITALS — BP 117/79 | HR 70 | Ht 68.0 in | Wt 147.0 lb

## 2016-05-30 DIAGNOSIS — Z01419 Encounter for gynecological examination (general) (routine) without abnormal findings: Secondary | ICD-10-CM | POA: Diagnosis not present

## 2016-05-30 DIAGNOSIS — Z1231 Encounter for screening mammogram for malignant neoplasm of breast: Secondary | ICD-10-CM

## 2016-05-30 DIAGNOSIS — N3090 Cystitis, unspecified without hematuria: Secondary | ICD-10-CM

## 2016-05-30 MED ORDER — SCOPOLAMINE 1 MG/3DAYS TD PT72: 1.0000 | MEDICATED_PATCH | TRANSDERMAL | 12 refills | Status: DC

## 2016-05-30 MED ORDER — SULFAMETHOXAZOLE-TRIMETHOPRIM 800-160 MG PO TABS: 1.0000 | ORAL_TABLET | Freq: Two times a day (BID) | ORAL | 0 refills | Status: DC

## 2016-05-30 MED ORDER — PROMETHAZINE HCL 25 MG PO TABS: 25.0000 mg | ORAL_TABLET | Freq: Four times a day (QID) | ORAL | 2 refills | Status: DC | PRN

## 2016-05-30 MED ORDER — FLUCONAZOLE 150 MG PO TABS: 150.0000 mg | ORAL_TABLET | Freq: Once | ORAL | 3 refills | Status: AC

## 2016-05-30 MED ORDER — ESTRADIOL 10 MCG VA TABS: ORAL_TABLET | VAGINAL | 12 refills | Status: DC

## 2016-05-30 NOTE — Progress Notes (Signed)
Subjective:    Lynn Lloyd is a 60 y.o. 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 The patient has no complaints today. The patient is sexually active. GYN screening history: last pap: was normal. The patient wears seatbelts: yes. The patient participates in regular exercise: yes. Has the patient ever been transfused or tattooed?: no. The patient reports that there is not domestic violence in her life.   Menstrual History: OB History    Gravida Para Term Preterm AB Living   3 3 3     4    SAB TAB Ectopic Multiple Live Births         1        Menarche age: 11 No LMP recorded. Patient is postmenopausal.    The following portions of the patient's history were reviewed and updated as appropriate: allergies, current medications, past family history, past medical history, past social history, past surgical history and problem list.  Review of Systems Pertinent items are noted in HPI. Married for 40 years. Colonoscopy last year and normal. No FH of breast/gyn/colon cancer   Objective:    BP 117/79   Pulse 70   Ht 5\' 8"  (1.727 m)   Wt 147 lb (66.7 kg)   BMI 22.35 kg/m   General Appearance:    Alert, cooperative, no distress, appears stated age  Head:    Normocephalic, without obvious abnormality, atraumatic  Eyes:    PERRL, conjunctiva/corneas clear, EOM's intact, fundi    benign, both eyes  Ears:    Normal TM's and external ear canals, both ears  Nose:   Nares normal, septum midline, mucosa normal, no drainage    or sinus tenderness  Throat:   Lips, mucosa, and tongue normal; teeth and gums normal  Neck:   Supple, symmetrical, trachea midline, no adenopathy;    thyroid:  no enlargement/tenderness/nodules; no carotid   bruit or JVD  Back:     Symmetric, no curvature, ROM normal, no CVA tenderness  Lungs:     Clear to auscultation bilaterally, respirations unlabored  Chest Wall:    No tenderness or deformity   Heart:    Regular rate and rhythm, S1 and S2 normal,  no murmur, rub   or gallop  Breast Exam:    No tenderness, masses, or nipple abnormality  Abdomen:     Soft, non-tender, bowel sounds active all four quadrants,    no masses, no organomegaly  Genitalia:    Normal female without lesion, discharge or tenderness, NSS, NT, no adnexal masses     Extremities:   Extremities normal, atraumatic, no cyanosis or edema  Pulses:   2+ and symmetric all extremities  Skin:   Skin color, texture, turgor normal, no rashes or lesions  Lymph nodes:   Cervical, supraclavicular, and axillary nodes normal  Neurologic:   CNII-XII intact, normal strength, sensation and reflexes    throughout   .    Assessment:    Healthy female exam.    Plan:     Mammogram. represcribe vagifem

## 2016-08-24 ENCOUNTER — Ambulatory Visit (INDEPENDENT_AMBULATORY_CARE_PROVIDER_SITE_OTHER): Payer: BC Managed Care – PPO | Admitting: Physician Assistant

## 2016-08-24 VITALS — BP 136/91 | HR 79 | Temp 98.4°F | Wt 154.0 lb

## 2016-08-24 DIAGNOSIS — J209 Acute bronchitis, unspecified: Secondary | ICD-10-CM | POA: Diagnosis not present

## 2016-08-24 MED ORDER — ALBUTEROL SULFATE HFA 108 (90 BASE) MCG/ACT IN AERS: 1.0000 | INHALATION_SPRAY | RESPIRATORY_TRACT | 0 refills | Status: DC | PRN

## 2016-08-24 MED ORDER — FLUTICASONE PROPIONATE 50 MCG/ACT NA SUSP: 2.0000 | Freq: Every day | NASAL | 0 refills | Status: DC | PRN

## 2016-08-24 MED ORDER — IPRATROPIUM BROMIDE 0.06 % NA SOLN: 1.0000 | Freq: Four times a day (QID) | NASAL | 0 refills | Status: DC | PRN

## 2016-08-24 MED ORDER — BENZONATATE 200 MG PO CAPS: 200.0000 mg | ORAL_CAPSULE | Freq: Three times a day (TID) | ORAL | 0 refills | Status: DC | PRN

## 2016-08-24 NOTE — Progress Notes (Signed)
HPI:                                                                Lynn Lloyd is a 61 y.o. female who presents to [place] [place] today for cough  Cough  This is a new problem. The current episode started in the past 7 days. The problem has been gradually worsening. The problem occurs hourly. The cough is productive of sputum. Associated symptoms include chills, myalgias, nasal congestion, rhinorrhea and a sore throat. Pertinent negatives include no chest pain, ear pain, fever, hemoptysis, rash, shortness of breath or wheezing. Risk factors for lung disease include travel (recently returned from a cruise). Treatments tried: OTC antihistamine. The treatment provided no relief. There is no history of asthma, COPD or environmental allergies.    Health Maintenance Health Maintenance  Topic Date Due  . Hepatitis C Screening  [birth year]  . ZOSTAVAX  12/20/2014  . INFLUENZA VACCINE  08/21/2018 (Originally 03/21/2016)  . PAP SMEAR  04/19/2018  . MAMMOGRAM  05/30/2018  . TETANUS/TDAP  10/24/2021  . COLONOSCOPY  02/07/2024  . HIV Screening  Completed    Past Medical History:  Diagnosis Date  . Chronic kidney disease   . Constipation   . Endometriosis   . Hemorrhoid    Past Surgical History:  Procedure Laterality Date  . BUNIONECTOMY  12/11  . CHOLECYSTECTOMY    . LAPAROSCOPY     endometriosis  . SALIVARY GLAND SURGERY     removal  . TONSILLECTOMY AND ADENOIDECTOMY     Social History  Substance Use Topics  . Smoking status: Never Smoker  . Smokeless tobacco: Never Used  . Alcohol use 1.0 oz/week    2 Standard drinks or equivalent per week     Comment: Occ   family history includes Cancer in her mother; Hypertension in her mother; Parkinsonism in her father; Thyroid disease in her father and mother.  Review of Systems  Constitutional: Positive for chills and malaise/fatigue. Negative for fever.  HENT: Positive for congestion,  rhinorrhea, sinus pain (right) and sore throat. Negative for ear pain and nosebleeds.   Respiratory: Positive for cough (w/some chest discomfort). Negative for hemoptysis, shortness of breath and wheezing.   Cardiovascular: Negative for chest pain and palpitations.  Gastrointestinal: Negative for abdominal pain, nausea and vomiting.  Musculoskeletal: Positive for myalgias.  Skin: Positive for itching. Negative for rash.  Endo/Heme/Allergies: Negative for environmental allergies.     Medications: Current Outpatient Prescriptions  Medication Sig Dispense Refill  . Estradiol 10 MCG TABS vaginal tablet Apply per vagina  3 times per week 30 tablet 12  . Multiple Vitamin (MULTIVITAMIN) tablet Take 1 tablet by mouth daily.     No current facility-administered medications for this visit.    Allergies  Allergen Reactions  . Amoxicillin-Pot Clavulanate     "get really spaced out" pharmacist said was due to clavulanate  . Codeine Nausea Only  . Latex Other (See Comments)    whelps     Objective:  BP (!) 136/91   Pulse 79   Temp 98.4 F (36.9 C) (Oral)   Wt 154 lb (69.9 kg)   BMI 23.42 kg/m  Gen: well-groomed, cooperative, not ill-appearing, no distress HEENT: normal conjunctiva, TM's  clear, oropharynx clear, moist mucus membranes, no thyromegaly or tenderness Lungs: Normal work of breathing, clear to auscultation bilaterally Heart: Normal rate, regular rhythm, s1 and s2 distinct, no murmurs, clicks or rubs appreciated on this exam, no carotid bruit Lymph: no cervical or tonsillar adenopathy Skin: warm and dry, no rashes or lesions on exposed skin   No results found for this or any previous visit (from the past 72 hour(s)). No results found.    Assessment and Plan: 61 y.o. female with   1. Acute bronchitis, unspecified organism - discussed conservative treatment - Albuterol as needed for bronchospasm - Tessalon 200mg  TID prn for cough - Flonase daily  Patient education  and anticipatory guidance given Patient agrees with treatment plan Follow-up as needed if symptoms worsen or fail to improve  Darlyne Russian PA-C

## 2016-08-24 NOTE — Patient Instructions (Signed)

## 2016-08-25 ENCOUNTER — Encounter: Payer: Self-pay | Admitting: Physician Assistant

## 2016-08-28 ENCOUNTER — Telehealth: Payer: Self-pay | Admitting: *Deleted

## 2016-08-28 MED ORDER — AZITHROMYCIN 250 MG PO TABS: ORAL_TABLET | ORAL | 0 refills | Status: DC

## 2016-08-28 NOTE — Telephone Encounter (Signed)
Sent a prescription for Azithromycin to her pharmacy. Thank you!

## 2016-08-28 NOTE — Telephone Encounter (Signed)
Patient called and states she saw Sherlie Ban PA-C on 08/24/16 for bronchitis. She states she is not feeling any better and was told to call back if not feeling any better. Patient still has cough and sounded very congested on the message. Denies new symptoms just not any better. Please advise

## 2016-09-01 MED ORDER — PREDNISONE 50 MG PO TABS: 50.0000 mg | ORAL_TABLET | Freq: Every day | ORAL | 0 refills | Status: DC

## 2016-09-01 NOTE — Telephone Encounter (Signed)
Pt called clinic today stating she started the antibiotic, has been using her nasal spray, but now her ears hurt. Pt reports they feel "blocked" and it is hard for her to hear. Please advise if you have any further recommendation or if she needs reevaluation.

## 2016-09-01 NOTE — Telephone Encounter (Signed)
Ok to send prednisone 50mg  1 po qd #5 NRF

## 2016-09-01 NOTE — Telephone Encounter (Signed)
Rx sent in, Pt advised.

## 2016-09-01 NOTE — Addendum Note (Signed)
Addended by: Huel Cote on: 09/01/2016 03:43 PM   Modules accepted: Orders

## 2016-09-11 ENCOUNTER — Ambulatory Visit (INDEPENDENT_AMBULATORY_CARE_PROVIDER_SITE_OTHER): Payer: BC Managed Care – PPO | Admitting: Family Medicine

## 2016-09-11 ENCOUNTER — Ambulatory Visit (INDEPENDENT_AMBULATORY_CARE_PROVIDER_SITE_OTHER): Payer: BC Managed Care – PPO

## 2016-09-11 ENCOUNTER — Encounter: Payer: Self-pay | Admitting: Family Medicine

## 2016-09-11 VITALS — BP 135/79 | HR 67 | Temp 99.0°F | Wt 151.0 lb

## 2016-09-11 DIAGNOSIS — R0602 Shortness of breath: Secondary | ICD-10-CM | POA: Diagnosis not present

## 2016-09-11 DIAGNOSIS — R05 Cough: Secondary | ICD-10-CM | POA: Diagnosis not present

## 2016-09-11 DIAGNOSIS — R079 Chest pain, unspecified: Secondary | ICD-10-CM

## 2016-09-11 DIAGNOSIS — R059 Cough, unspecified: Secondary | ICD-10-CM

## 2016-09-11 NOTE — Progress Notes (Signed)
Lynn Lloyd is a 61 y.o. female who presents to [facility]: [facility] today for cough congestion fatigue and right ear fullness. Patient has been sick for about a month now. She was seen by another provider in this clinic on January 4 and has been treated so far with prednisone and azithromycin Atrovent nasal spray Flonase Tessalon Perles and albuterol. She notes nothing is provided lasting treatment. She continues to have a bothersome cough and congestion as well as right ear fullness and decreased hearing. She also notes significant fatigue. She denies any fevers or chills vomiting or diarrhea. She notes that she has a pertinent history of right eustachian tube dysfunction causing frequent episodes of serous otitis effusions in her youth.  She also notes some of her fatigue symptoms happened after a long flight. She denies any leg swelling or chest pain   Past Medical History:  Diagnosis Date  . Chronic kidney disease   . Constipation   . Endometriosis   . Hemorrhoid    Past Surgical History:  Procedure Laterality Date  . BUNIONECTOMY  12/11  . CHOLECYSTECTOMY    . LAPAROSCOPY     endometriosis  . SALIVARY GLAND SURGERY     removal  . TONSILLECTOMY AND ADENOIDECTOMY     Social History  Substance Use Topics  . Smoking status: Never Smoker  . Smokeless tobacco: Never Used  . Alcohol use 1.0 oz/week    2 Standard drinks or equivalent per week     Comment: Occ   family history includes Cancer in her mother; Hypertension in her mother; Parkinsonism in her father; Thyroid disease in her father and mother.  ROS as above:  Medications: Current Outpatient Prescriptions  Medication Sig Dispense Refill  . albuterol (PROVENTIL HFA;VENTOLIN HFA) 108 (90 Base) MCG/ACT inhaler Inhale 1 puff into the lungs every 4 (four) hours as needed for wheezing. 1 Inhaler 0  . azithromycin  (ZITHROMAX Z-PAK) 250 MG tablet Take 2 tablets (500 mg) on  Day 1,  followed by 1 tablet (250 mg) once daily on Days 2 through 5. 6 tablet 0  . benzonatate (TESSALON) 200 MG capsule Take 1 capsule (200 mg total) by mouth 3 (three) times daily as needed for cough. 45 capsule 0  . Estradiol 10 MCG TABS vaginal tablet Apply per vagina  3 times per week 30 tablet 12  . fluticasone (FLONASE) 50 MCG/ACT nasal spray Place 2 sprays into both nostrils daily as needed for allergies or rhinitis. 16 g 0  . ipratropium (ATROVENT) 0.06 % nasal spray Place 1 spray into both nostrils 4 (four) times daily as needed for rhinitis. 15 mL 0  . Multiple Vitamin (MULTIVITAMIN) tablet Take 1 tablet by mouth daily.    . predniSONE (DELTASONE) 50 MG tablet Take 1 tablet (50 mg total) by mouth daily. 5 tablet 0   No current facility-administered medications for this visit.    Allergies  Allergen Reactions  . Amoxicillin-Pot Clavulanate     "get really spaced out" pharmacist said was due to clavulanate  . Codeine Nausea Only  . Latex Other (See Comments)    whelps    Health Maintenance Health Maintenance  Topic Date Due  . Hepatitis C Screening  Jul 21, 1956  . ZOSTAVAX  12/20/2014  . INFLUENZA VACCINE  08/21/2018 (Originally 03/21/2016)  . PAP SMEAR  04/19/2018  . MAMMOGRAM  05/30/2018  . TETANUS/TDAP  10/24/2021  . COLONOSCOPY  02/07/2024  . HIV  Screening  Completed     Exam:  BP 135/79   Pulse 67   Temp 99 F (37.2 C) (Oral)   Wt 151 lb (68.5 kg)   SpO2 100%   BMI 22.96 kg/m  Gen: Well NAD HEENT: EOMI,  MMM Right tympanic membrane with effusion without erythema. Left is normal. No cervical lymphadenopathy. Clear nasal discharge. Posterior pharynx with cobblestoning. Lungs: Normal work of breathing. CTABL Heart: RRR no MRG Abd: NABS, Soft. Nondistended, Nontender Exts: Brisk capillary refill, warm and well perfused. No edema or palpable cords present calf diameters are equal bilaterally.   No  results found for this or any previous visit (from the past 72 hour(s)). No results found.    Assessment and Plan: 61 y.o. female with fatigue cough congestion and right serous otitis effusion duration for about a month. Patient does not appear to be significantly sac but her symptoms have been ongoing long within normal. She's had a reasonable trial of treatment. Plan to obtain limited workup listed below. Recommend continued use of albuterol and Atrovent nasal spray. If your symptoms continue to be bothersome refer to ENT for consideration of tubes.   Orders Placed This Encounter  Procedures  . DG Chest 2 View    Order Specific Question:   Reason for exam:    Answer:   Cough, assess intra-thoracic pathology    Order Specific Question:   Preferred imaging location?    Answer:   [facility]  . COMPLETE METABOLIC PANEL WITH GFR  . D-dimer, quantitative (not at [facility])  . CBC with Differential/Platelet    Discussed warning signs or symptoms. Please see discharge instructions. Patient expresses understanding.

## 2016-09-11 NOTE — Patient Instructions (Signed)
Thank you for coming in today. Call or go to the emergency room if you get worse, have trouble breathing, have chest pains, or palpitations.  Use atrovent nasal spray.    Cough, Adult Introduction A cough helps to clear your throat and lungs. A cough may last only 2-3 weeks (acute), or it may last longer than 8 weeks (chronic). Many different things can cause a cough. A cough may be a sign of an illness or another medical condition. Follow these instructions at home:  Pay attention to any changes in your cough.  Take medicines only as told by your doctor.  If you were prescribed an antibiotic medicine, take it as told by your doctor. Do not stop taking it even if you start to feel better.  Talk with your doctor before you try using a cough medicine.  Drink enough fluid to keep your pee (urine) clear or pale yellow.  If the air is dry, use a cold steam vaporizer or humidifier in your home.  Stay away from things that make you cough at work or at home.  If your cough is worse at night, try using extra pillows to raise your head up higher while you sleep.  Do not smoke, and try not to be around smoke. If you need help quitting, ask your doctor.  Do not have caffeine.  Do not drink alcohol.  Rest as needed. Contact a doctor if:  You have new problems (symptoms).  You cough up yellow fluid (pus).  Your cough does not get better after 2-3 weeks, or your cough gets worse.  Medicine does not help your cough and you are not sleeping well.  You have pain that gets worse or pain that is not helped with medicine.  You have a fever.  You are losing weight and you do not know why.  You have night sweats. Get help right away if:  You cough up blood.  You have trouble breathing.  Your heartbeat is very fast. This information is not intended to replace advice given to you by your health care provider. Make sure you discuss any questions you have with your health care  provider. Document Released: 04/20/2011 Document Revised: 01/13/2016 Document Reviewed: 10/14/2014  2017 [employer]

## 2016-09-12 ENCOUNTER — Telehealth: Payer: Self-pay | Admitting: Family Medicine

## 2016-09-12 LAB — CBC WITH DIFFERENTIAL/PLATELET
Basophils Absolute: 114 cells/uL (ref 0–200)
Basophils Relative: 1 %
Eosinophils Absolute: 228 cells/uL (ref 15–500)
Eosinophils Relative: 2 %
HCT: 41.6 % (ref 35.0–45.0)
Hemoglobin: 13.8 g/dL (ref 11.7–15.5)
LYMPHS ABS: 3306 {cells}/uL (ref 850–3900)
LYMPHS PCT: 29 %
MCH: 30.9 pg (ref 27.0–33.0)
MCHC: 33.2 g/dL (ref 32.0–36.0)
MCV: 93.3 fL (ref 80.0–100.0)
MONO ABS: 1026 {cells}/uL — AB (ref 200–950)
MPV: 10.2 fL (ref 7.5–12.5)
Monocytes Relative: 9 %
NEUTROS ABS: 6726 {cells}/uL (ref 1500–7800)
NEUTROS PCT: 59 %
PLATELETS: 337 10*3/uL (ref 140–400)
RBC: 4.46 MIL/uL (ref 3.80–5.10)
RDW: 13.5 % (ref 11.0–15.0)
WBC: 11.4 10*3/uL — AB (ref 3.8–10.8)

## 2016-09-12 LAB — COMPLETE METABOLIC PANEL WITH GFR
ALT: 17 U/L (ref 6–29)
AST: 16 U/L (ref 10–35)
Albumin: 4.1 g/dL (ref 3.6–5.1)
Alkaline Phosphatase: 64 U/L (ref 33–130)
BUN: 13 mg/dL (ref 7–25)
CALCIUM: 9.4 mg/dL (ref 8.6–10.4)
CHLORIDE: 103 mmol/L (ref 98–110)
CO2: 30 mmol/L (ref 20–31)
CREATININE: 1.08 mg/dL — AB (ref 0.50–0.99)
GFR, EST AFRICAN AMERICAN: 64 mL/min (ref >=60)
GFR, EST NON AFRICAN AMERICAN: 56 mL/min — AB (ref >=60)
Glucose, Bld: 96 mg/dL (ref 65–99)
POTASSIUM: 4.2 mmol/L (ref 3.5–5.3)
Sodium: 138 mmol/L (ref 135–146)
Total Bilirubin: 0.6 mg/dL (ref 0.2–1.2)
Total Protein: 6.8 g/dL (ref 6.1–8.1)

## 2016-09-12 LAB — D-DIMER, QUANTITATIVE (NOT AT ARMC): D DIMER QUANT: 0.7 ug{FEU}/mL — AB (ref <0.50)

## 2016-09-12 NOTE — Telephone Encounter (Signed)
Patient called back regarding elevated d-dimer. She called back in about 4:30 this evening. We discussed options. She feels about the same as she did yesterday and denies no new chest pain palpitations shortness of breath.  She declined going to the emergency room tonight and we'll follow-up tomorrow morning. At that time will likely provide IV fluids and schedule a chest CT angiogram.  She expresses understanding and agreement.

## 2016-09-12 NOTE — Telephone Encounter (Signed)
D-dimer is positive. I: Left a message for patient to call me back. She should return to clinic today for 1 L of IV fluid prior to CT angiogram of her chest. Will notify patient when she calls back.

## 2016-09-13 ENCOUNTER — Ambulatory Visit (INDEPENDENT_AMBULATORY_CARE_PROVIDER_SITE_OTHER): Payer: BC Managed Care – PPO

## 2016-09-13 ENCOUNTER — Ambulatory Visit (INDEPENDENT_AMBULATORY_CARE_PROVIDER_SITE_OTHER): Payer: BC Managed Care – PPO | Admitting: Family Medicine

## 2016-09-13 VITALS — BP 119/73 | HR 77 | Temp 98.7°F | Wt 151.0 lb

## 2016-09-13 DIAGNOSIS — R0602 Shortness of breath: Secondary | ICD-10-CM | POA: Diagnosis not present

## 2016-09-13 DIAGNOSIS — Z452 Encounter for adjustment and management of vascular access device: Secondary | ICD-10-CM | POA: Diagnosis not present

## 2016-09-13 DIAGNOSIS — R7989 Other specified abnormal findings of blood chemistry: Secondary | ICD-10-CM

## 2016-09-13 DIAGNOSIS — R079 Chest pain, unspecified: Secondary | ICD-10-CM | POA: Diagnosis not present

## 2016-09-13 MED ORDER — IOPAMIDOL (ISOVUE-370) INJECTION 76%
100.0000 mL | Freq: Once | INTRAVENOUS | Status: AC | PRN
Start: 2016-09-13 — End: 2016-09-13
  Administered 2016-09-13: 100 mL via INTRAVENOUS

## 2016-09-13 NOTE — Progress Notes (Signed)
Lynn Lloyd is a 61 y.o. female who presents to [facility] today for follow-up shortness of breath and cough. Patient was seen on Monday for shortness of breath and cough. A workup was done including a chest x-ray and CBC CMP and d-dimer. Unfortunately the d-dimer was mildly positive at 0.7. We are able to get back in touch with the patient at about 5:00 yesterday. At that time the patient decided to avoid the emergency room and return to clinic this morning. She denies any worsening chest pain palpitations or shortness of breath.   Past Medical History:  Diagnosis Date  . Chronic kidney disease   . Constipation   . Endometriosis   . Hemorrhoid    Past Surgical History:  Procedure Laterality Date  . BUNIONECTOMY  12/11  . CHOLECYSTECTOMY    . LAPAROSCOPY     endometriosis  . SALIVARY GLAND SURGERY     removal  . TONSILLECTOMY AND ADENOIDECTOMY     Social History  Substance Use Topics  . Smoking status: Never Smoker  . Smokeless tobacco: Never Used  . Alcohol use 1.0 oz/week    2 Standard drinks or equivalent per week     Comment: Occ   family history includes Cancer in her mother; Hypertension in her mother; Parkinsonism in her father; Thyroid disease in her father and mother.  ROS as above:  Medications: Current Outpatient Prescriptions  Medication Sig Dispense Refill  . albuterol (PROVENTIL HFA;VENTOLIN HFA) 108 (90 Base) MCG/ACT inhaler Inhale 1 puff into the lungs every 4 (four) hours as needed for wheezing. 1 Inhaler 0  . AMBULATORY NON FORMULARY MEDICATION Tumeric 500 mg    . azithromycin (ZITHROMAX Z-PAK) 250 MG tablet Take 2 tablets (500 mg) on  Day 1,  followed by 1 tablet (250 mg) once daily on Days 2 through 5. 6 tablet 0  . benzonatate (TESSALON) 200 MG capsule Take 1 capsule (200 mg total) by mouth 3 (three) times daily as needed for  cough. 45 capsule 0  . Biotin 10000 MCG TABS Take by mouth.    . Cranberry 500 MG CAPS Take by mouth.    . fluticasone (FLONASE) 50 MCG/ACT nasal spray Place 2 sprays into both nostrils daily as needed for allergies or rhinitis. 16 g 0  . ipratropium (ATROVENT) 0.06 % nasal spray Place 1 spray into both nostrils 4 (four) times daily as needed for rhinitis. 15 mL 0  . Magnesium 250 MG TABS Take by mouth.    . Multiple Vitamin (MULTIVITAMIN) tablet Take 1 tablet by mouth daily.    . predniSONE (DELTASONE) 50 MG tablet Take 1 tablet (50 mg total) by mouth daily. 5 tablet 0  . Probiotic Product (PROBIOTIC DAILY PO) Take by mouth.    . Zinc 50 MG CAPS Take by mouth.    . Estradiol 10 MCG TABS vaginal tablet Apply per vagina  3 times per week (Patient not taking: Reported on 09/13/2016) 30 tablet 12   No current facility-administered medications for this visit.    Allergies  Allergen Reactions  . Amoxicillin-Pot Clavulanate     "get really spaced out" pharmacist said was due to clavulanate  . Codeine Nausea Only  . Latex Other (See Comments)    whelps    Health Maintenance Health Maintenance  Topic Date Due  . Hepatitis C Screening  25-Jul-1956  . ZOSTAVAX  12/20/2014  . INFLUENZA VACCINE  08/21/2018 (Originally 03/21/2016)  .  PAP SMEAR  04/19/2018  . MAMMOGRAM  05/30/2018  . TETANUS/TDAP  10/24/2021  . COLONOSCOPY  02/07/2024  . HIV Screening  Completed     Exam:  BP 119/73   Pulse 77   Temp 98.7 F (37.1 C) (Oral)   Wt 151 lb (68.5 kg) Comment: weight is from last visit  BMI 22.96 kg/m  Gen: Well NAD HEENT: EOMI,  MMM Lungs: Normal work of breathing. CTABL Heart: RRR no MRG Abd: NABS, Soft. Nondistended, Nontender Exts: Brisk capillary refill, warm and well perfused. No leg edema  Patient was given 1 L of IV fluids and scheduled for CT angiogram of her chest to evaluate for pulmonary embolism.   Results for orders placed or performed in visit on 09/11/16 (from the past  72 hour(s))  COMPLETE METABOLIC PANEL WITH GFR     Status: Abnormal   Collection Time: 09/11/16  4:50 PM  Result Value Ref Range   Sodium 138 135 - 146 mmol/L   Potassium 4.2 3.5 - 5.3 mmol/L   Chloride 103 98 - 110 mmol/L   CO2 30 20 - 31 mmol/L   Glucose, Bld 96 65 - 99 mg/dL   BUN 13 7 - 25 mg/dL   Creat 1.08 (H) 0.50 - 0.99 mg/dL    Comment:   For patients > or = 62 years of age: The upper reference limit for Creatinine is approximately 13% higher for people identified as African-American.      Total Bilirubin 0.6 0.2 - 1.2 mg/dL   Alkaline Phosphatase 64 33 - 130 U/L   AST 16 10 - 35 U/L   ALT 17 6 - 29 U/L   Total Protein 6.8 6.1 - 8.1 g/dL   Albumin 4.1 3.6 - 5.1 g/dL   Calcium 9.4 8.6 - 10.4 mg/dL   GFR, Est African American 64 >=60 mL/min   GFR, Est Non African American 56 (L) >=60 mL/min  D-dimer, quantitative (not at [facility])     Status: Abnormal   Collection Time: 09/11/16  4:50 PM  Result Value Ref Range   D-Dimer, Quant 0.70 (H) <0.50 mcg/mL FEU    Comment:   The D-Dimer test is used frequently to exclude an acute PE or DVT.  In patients with a low to moderate clinical risk assessment and a D-Dimer result <0.50 mcg/mL FEU, the likelihood of a PE or DVT is very low.  However, a thromboembolic event should not be excluded solely on the basis of the D-Dimer level.  Increased levels of D-Dimer are associated with a PE, DVT, DIC, malignancies, inflammation, sepsis, surgery, trauma, pregnancy, and advancing patient age. [Jama 2006 11:295(2): 347-425]   For additional information, please refer to: http://education.questdiagnostics.com/faq/FAQ149 (This link is being provided for information/ educational purposes only)     CBC with Differential/Platelet     Status: Abnormal   Collection Time: 09/11/16  4:50 PM  Result Value Ref Range   WBC 11.4 (H) 3.8 - 10.8 K/uL   RBC 4.46 3.80 - 5.10 MIL/uL   Hemoglobin 13.8 11.7 - 15.5 g/dL   HCT 41.6 35.0 - 45.0 %   MCV  93.3 80.0 - 100.0 fL   MCH 30.9 27.0 - 33.0 pg   MCHC 33.2 32.0 - 36.0 g/dL   RDW 13.5 11.0 - 15.0 %   Platelets 337 140 - 400 K/uL   MPV 10.2 7.5 - 12.5 fL   Neutro Abs 6,726 1,500 - 7,800 cells/uL   Lymphs Abs 3,306 850 - 3,900 cells/uL  Monocytes Absolute 1,026 (H) 200 - 950 cells/uL   Eosinophils Absolute 228 15 - 500 cells/uL   Basophils Absolute 114 0 - 200 cells/uL   Neutrophils Relative % 59 %   Lymphocytes Relative 29 %   Monocytes Relative 9 %   Eosinophils Relative 2 %   Basophils Relative 1 %   Smear Review Criteria for review not met    Dg Chest 2 View  Result Date: 09/12/2016 CLINICAL DATA:  Productive cough and shortness of breath and chest pain for the past month. Diagnosed with bronchitis on August 25, 2015. Nonsmoker. EXAM: CHEST  2 VIEW COMPARISON:  Chest x-ray of September 29, 2009 FINDINGS: The lungs are mildly hyperinflated and clear. The heart and pulmonary vascularity are normal. The mediastinum is normal in width. There is no pleural effusion. The bony thorax exhibits no acute abnormality. IMPRESSION: Mild hyperinflation consistent with reactive airway disease or COPD. No pneumonia, CHF, nor other acute cardiopulmonary abnormality. Electronically Signed   By: David  Martinique M.D.   On: 09/12/2016 07:55   Ct Angio Chest Pe W Or Wo Contrast  Result Date: 09/13/2016 CLINICAL DATA:  Chest pain and shortness of Breath EXAM: CT ANGIOGRAPHY CHEST WITH CONTRAST TECHNIQUE: Multidetector CT imaging of the chest was performed using the standard protocol during bolus administration of intravenous contrast. Multiplanar CT image reconstructions and MIPs were obtained to evaluate the vascular anatomy. CONTRAST:  100 mL Isovue 370 COMPARISON:  09/11/2016 FINDINGS: Cardiovascular: The thoracic aorta shows no significant aneurysmal dilatation or dissection. The pulmonary artery shows a normal branching pattern without evidence of intraluminal filling defect to suggest pulmonary embolism.  No significant coronary calcifications are seen. Cardiac structures are not significantly enlarged. Mediastinum/Nodes: The thoracic inlet is within normal limits. No significant hilar or mediastinal adenopathy is noted. The esophagus as visualized is within normal limits. Lungs/Pleura: The lungs are well aerated bilaterally. No focal infiltrate or sizable effusion is seen. Minimal dependent atelectatic changes are seen. Upper Abdomen: Within normal limits. Musculoskeletal: Degenerative changes of thoracic spine are noted. Review of the MIP images confirms the above findings. IMPRESSION: No evidence pulmonary emboli. No acute abnormality seen. Electronically Signed   By: Inez Catalina M.D.   On: 09/13/2016 10:38      Assessment and Plan: 61 y.o. female with continued shortness of breath viral illness. Fortunately CT angiogram showed no evidence of pulmonary embolism. Additionally no hidden pneumonias or other serious pulmonary findings are present. I recommend patient practice a bit of rest and watchful waiting for a week or 2. If not better follow-up with PCP.   Orders Placed This Encounter  Procedures  . CT ANGIO CHEST PE W OR WO CONTRAST    Standing Status:   Future    Number of Occurrences:   1    Standing Expiration Date:   12/12/2017    Order Specific Question:   Reason for exam:    Answer:   Eval for PE, other intrathoracic pathology.    Order Specific Question:   Preferred imaging location?    Answer:   [facility]    Discussed warning signs or symptoms. Please see discharge instructions. Patient expresses understanding.

## 2016-09-13 NOTE — Assessment & Plan Note (Signed)
20-gauge angiocatheter in the left basilic vein at the elbow. Further fluid management per primary treating physician.

## 2016-09-13 NOTE — Progress Notes (Signed)
  Subjective:    CC: Need vascular access  HPI: This is a pleasant 61 year old female, she needs vascular access for a CT scan and IV fluid hydration. I'm called for further evaluation and definitive management in establishing IV access.  Past medical history:  Negative.  See flowsheet/record as well for more information.  Surgical history: Negative.  See flowsheet/record as well for more information.  Family history: Negative.  See flowsheet/record as well for more information.  Social history: Negative.  See flowsheet/record as well for more information.  Allergies, and medications have been entered into the medical record, reviewed, and no changes needed.   Review of Systems: No fevers, chills, night sweats, weight loss, chest pain, or shortness of breath.   Objective:    General: Well Developed, well nourished, and in no acute distress.  Neuro: Alert and oriented x3, extra-ocular muscles intact, sensation grossly intact.  HEENT: Normocephalic, atraumatic, pupils equal round reactive to light, neck supple, no masses, no lymphadenopathy, thyroid nonpalpable.  Skin: Warm and dry, no rashes. Cardiac: Regular rate and rhythm, no murmurs rubs or gallops, no lower extremity edema.  Respiratory: Clear to auscultation bilaterally. Not using accessory muscles, speaking in full sentences.  20-gauge angiocatheter placed in the left basilic vein at the elbow.  Impression and Recommendations:    Encounter for adjustment or management of vascular access device 20-gauge angiocatheter in the left basilic vein at the elbow. Further fluid management per primary treating physician.  I spent 25 minutes with this patient, greater than 50% was face-to-face time counseling regarding the above diagnoses

## 2016-09-21 ENCOUNTER — Telehealth: Payer: Self-pay

## 2016-09-21 NOTE — Telephone Encounter (Signed)
Pt called saying she has been sick for 1 month and is not feeling any better. Pt states she has drainage going down her throat. Pt stated she has nasal congestion with a runny nose that is clear in color. Pt also stated she has pressure/pain behind eyes and cheek bones. Pt is unsure of fever. Pt advised to schedule an appt to be seen. Pt scheduled an appt with the front desk.

## 2016-09-22 ENCOUNTER — Encounter: Payer: Self-pay | Admitting: Sports Medicine

## 2016-09-22 ENCOUNTER — Ambulatory Visit (INDEPENDENT_AMBULATORY_CARE_PROVIDER_SITE_OTHER): Payer: BC Managed Care – PPO | Admitting: Sports Medicine

## 2016-09-22 DIAGNOSIS — J0101 Acute recurrent maxillary sinusitis: Secondary | ICD-10-CM

## 2016-09-22 MED ORDER — TRIAMCINOLONE ACETONIDE 55 MCG/ACT NA AERO: 2.0000 | INHALATION_SPRAY | Freq: Every day | NASAL | 12 refills | Status: DC

## 2016-09-22 MED ORDER — PHENYLEPHRINE HCL 10 MG PO TABS: 10.0000 mg | ORAL_TABLET | Freq: Two times a day (BID) | ORAL | 0 refills | Status: DC

## 2016-09-22 MED ORDER — AMOXICILLIN 875 MG PO TABS: 875.0000 mg | ORAL_TABLET | Freq: Two times a day (BID) | ORAL | 0 refills | Status: DC

## 2016-09-22 NOTE — Progress Notes (Signed)
  Subjective:    CC: Question sinus infection   HPI: This is a pleasant 61 year old female, she has had several upper respiratory infections, bronchitis recently, treated with antibiotics, this all resolved, unfortunately she has recently developed pain and pressure behind her right maxillary sinus, radiation to her year with significant postnasal drip and cough, no short of breath, no constitutional symptoms. She has a fairly significant history of ENT problems with a right submandibular gland excision, she's had it has never had sinus surgery. She is currently using Flonase.  Past medical history:  Negative.  See flowsheet/record as well for more information.  Surgical history: Negative.  See flowsheet/record as well for more information.  Family history: Negative.  See flowsheet/record as well for more information.  Social history: Negative.  See flowsheet/record as well for more information.  Allergies, and medications have been entered into the medical record, reviewed, and no changes needed.   Review of Systems: No fevers, chills, night sweats, weight loss, chest pain, or shortness of breath.   Objective:    General: Well Developed, well nourished, and in no acute distress.  Neuro: Alert and oriented x3, extra-ocular muscles intact, sensation grossly intact.  HEENT: Normocephalic, atraumatic, pupils equal round reactive to light, neck supple, no masses, no lymphadenopathy, thyroid nonpalpable. Oropharynx, ear canals unremarkable, tender to palpation over the right maxillary sinus, nasopharynx shows boggy, erythematous turbinates.  Skin: Warm and dry, no rashes. Cardiac: Regular rate and rhythm, no murmurs rubs or gallops, no lower extremity edema.  Respiratory: Clear to auscultation bilaterally. Not using accessory muscles, speaking in full sentences.  Impression and Recommendations:    Recurrent maxillary sinusitis Right-sided, switching to Nasacort, adding amoxicillin. She is not  allergic to amoxicillin but simply the clavulanic acid portion of Augmentin. She will take an oral decongestant. Return to see me in one month, referral for consideration of sinuplasty if no better.  I spent 25 minutes with this patient, greater than 50% was face-to-face time counseling regarding the above diagnoses

## 2016-09-22 NOTE — Assessment & Plan Note (Signed)
Right-sided, switching to Nasacort, adding amoxicillin. She is not allergic to amoxicillin but simply the clavulanic acid portion of Augmentin. She will take an oral decongestant. Return to see me in one month, referral for consideration of sinuplasty if no better.

## 2017-05-31 ENCOUNTER — Ambulatory Visit (INDEPENDENT_AMBULATORY_CARE_PROVIDER_SITE_OTHER): Payer: BC Managed Care – PPO | Admitting: Obstetrics & Gynecology

## 2017-05-31 ENCOUNTER — Encounter: Payer: Self-pay | Admitting: Obstetrics & Gynecology

## 2017-05-31 ENCOUNTER — Other Ambulatory Visit (HOSPITAL_COMMUNITY): Payer: Self-pay | Admitting: Obstetrics & Gynecology

## 2017-05-31 VITALS — BP 138/86 | HR 68 | Resp 16 | Ht 68.0 in | Wt 155.0 lb

## 2017-05-31 DIAGNOSIS — Z1231 Encounter for screening mammogram for malignant neoplasm of breast: Secondary | ICD-10-CM

## 2017-05-31 DIAGNOSIS — Z01419 Encounter for gynecological examination (general) (routine) without abnormal findings: Secondary | ICD-10-CM

## 2017-05-31 MED ORDER — OXYCODONE-ACETAMINOPHEN 5-325 MG PO TABS: 1.0000 | ORAL_TABLET | ORAL | 0 refills | Status: DC | PRN

## 2017-05-31 NOTE — Progress Notes (Signed)
Subjective:    Lynn Lloyd is a 61 y.o. MW P4 (76 x 2, 96, and 20 yo kids- 2 1/2 grands) female who presents for an annual exam. The patient has no complaints today. Never used vagifem, doing ok, esp since husband's libido is somewhat decreased. The patient is sexually active. GYN screening history: last pap: was normal. The patient wears seatbelts: yes. The patient participates in regular exercise: yes. Has the patient ever been transfused or tattooed?: no. The patient reports that there is not domestic violence in her life.   Menstrual History: OB History    Gravida Para Term Preterm AB Living   3 3 3     4    SAB TAB Ectopic Multiple Live Births         1        Menarche age: 76 No LMP recorded. Patient is postmenopausal.    The following portions of the patient's history were reviewed and updated as appropriate: allergies, current medications, past family history, past medical history, past social history, past surgical history and problem list.  Review of Systems Pertinent items are noted in HPI.   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 Declines flu vaccine Mammo due   Objective:    BP 138/86   Pulse 68   Resp 16   Ht 5\' 8"  (1.727 m)   Wt 155 lb (70.3 kg)   BMI 23.57 kg/m   General Appearance:    Alert, cooperative, no distress, appears stated age  Head:    Normocephalic, without obvious abnormality, atraumatic  Eyes:    PERRL, conjunctiva/corneas clear, EOM's intact, fundi    benign, both eyes  Ears:    Normal TM's and external ear canals, both ears  Nose:   Nares normal, septum midline, mucosa normal, no drainage    or sinus tenderness  Throat:   Lips, mucosa, and tongue normal; teeth and gums normal  Neck:   Supple, symmetrical, trachea midline, no adenopathy;    thyroid:  no enlargement/tenderness/nodules; no carotid   bruit or JVD  Back:     Symmetric, no curvature, ROM normal, no CVA tenderness  Lungs:     Clear to auscultation  bilaterally, respirations unlabored  Chest Wall:    No tenderness or deformity   Heart:    Regular rate and rhythm, S1 and S2 normal, no murmur, rub   or gallop  Breast Exam:    No tenderness, masses, or nipple abnormality  Abdomen:     Soft, non-tender, bowel sounds active all four quadrants,    no masses, no organomegaly  Genitalia:    Normal female without lesion, discharge or tenderness, NSSA, NT, no adnexal masses     Extremities:   Extremities normal, atraumatic, no cyanosis or edema  Pulses:   2+ and symmetric all extremities  Skin:   Skin color, texture, turgor normal, no rashes or lesions  Lymph nodes:   Cervical, supraclavicular, and axillary nodes normal  Neurologic:   CNII-XII intact, normal strength, sensation and reflexes    throughout  .    Assessment:    Healthy female exam.    Plan:     Thin prep Pap smear. with cotesing Fasting labs at her convenience

## 2017-06-01 ENCOUNTER — Other Ambulatory Visit: Payer: BC Managed Care – PPO

## 2017-06-06 ENCOUNTER — Ambulatory Visit: Payer: BC Managed Care – PPO

## 2017-06-06 LAB — CYTOLOGY - PAP
Diagnosis: NEGATIVE
HPV: NOT DETECTED

## 2017-06-27 ENCOUNTER — Ambulatory Visit (INDEPENDENT_AMBULATORY_CARE_PROVIDER_SITE_OTHER): Payer: BC Managed Care – PPO

## 2017-06-27 DIAGNOSIS — Z1231 Encounter for screening mammogram for malignant neoplasm of breast: Secondary | ICD-10-CM | POA: Diagnosis not present

## 2017-07-07 LAB — CBC
HCT: 43.2 % (ref 35.0–45.0)
HEMOGLOBIN: 14.2 g/dL (ref 11.7–15.5)
MCH: 30.3 pg (ref 27.0–33.0)
MCHC: 32.9 g/dL (ref 32.0–36.0)
MCV: 92.3 fL (ref 80.0–100.0)
MPV: 11.4 fL (ref 7.5–12.5)
Platelets: 251 10*3/uL (ref 140–400)
RBC: 4.68 10*6/uL (ref 3.80–5.10)
RDW: 12.2 % (ref 11.0–15.0)
WBC: 6.7 10*3/uL (ref 3.8–10.8)

## 2017-07-07 LAB — COMPREHENSIVE METABOLIC PANEL
AG RATIO: 1.9 (calc) (ref 1.0–2.5)
ALKALINE PHOSPHATASE (APISO): 65 U/L (ref 33–130)
ALT: 15 U/L (ref 6–29)
AST: 17 U/L (ref 10–35)
Albumin: 4.4 g/dL (ref 3.6–5.1)
BUN: 12 mg/dL (ref 7–25)
CHLORIDE: 105 mmol/L (ref 98–110)
CO2: 29 mmol/L (ref 20–32)
Calcium: 9.2 mg/dL (ref 8.6–10.4)
Creat: 0.68 mg/dL (ref 0.50–0.99)
GLOBULIN: 2.3 g/dL (ref 1.9–3.7)
Glucose, Bld: 98 mg/dL (ref 65–99)
Potassium: 4.5 mmol/L (ref 3.5–5.3)
Sodium: 140 mmol/L (ref 135–146)
Total Bilirubin: 0.5 mg/dL (ref 0.2–1.2)
Total Protein: 6.7 g/dL (ref 6.1–8.1)

## 2017-07-07 LAB — LIPID PANEL
CHOLESTEROL: 208 mg/dL — AB (ref <200)
HDL: 99 mg/dL (ref >50)
LDL Cholesterol (Calc): 95 mg/dL (calc)
Non-HDL Cholesterol (Calc): 109 mg/dL (calc) (ref <130)
Total CHOL/HDL Ratio: 2.1 (calc) (ref <5.0)
Triglycerides: 62 mg/dL (ref <150)

## 2017-07-07 LAB — TSH: TSH: 2.08 m[IU]/L (ref 0.40–4.50)

## 2017-07-07 LAB — VITAMIN D 1,25 DIHYDROXY
VITAMIN D 1, 25 (OH) TOTAL: 47 pg/mL (ref 18–72)
VITAMIN D3 1, 25 (OH): 47 pg/mL

## 2017-10-09 ENCOUNTER — Encounter: Payer: Self-pay | Admitting: Family Medicine

## 2017-10-09 ENCOUNTER — Ambulatory Visit (INDEPENDENT_AMBULATORY_CARE_PROVIDER_SITE_OTHER): Payer: BC Managed Care – PPO | Admitting: Family Medicine

## 2017-10-09 VITALS — BP 113/71 | HR 97 | Temp 98.9°F | Ht 68.0 in | Wt 148.0 lb

## 2017-10-09 DIAGNOSIS — R6889 Other general symptoms and signs: Secondary | ICD-10-CM

## 2017-10-09 DIAGNOSIS — J069 Acute upper respiratory infection, unspecified: Secondary | ICD-10-CM

## 2017-10-09 LAB — POCT INFLUENZA A/B
Influenza A, POC: NEGATIVE
Influenza B, POC: NEGATIVE

## 2017-10-09 NOTE — Progress Notes (Signed)
   Subjective:    Patient ID: Lynn Lloyd, female    DOB: [birth year], 62 y.o.   MRN: [id number]  HPI Started  3 days ago with dry cough and ST.  Tried zyrtec and Zquail.  Temp to 99.2.  Took Advil yesterday.  She is leaving the country next Wed.  + bodyaches.  No ear pain but has chronic eustachian tube problems on the right.  She has felt nauseated today.  She didn't use the flu shot today.    Review of Systems     Objective:   Physical Exam  Constitutional: She is oriented to person, place, and time. She appears well-developed and well-nourished.  HENT:  Head: Normocephalic and atraumatic.  Right Ear: External ear normal.  Left Ear: External ear normal.  Nose: Nose normal.  Mouth/Throat: Oropharynx is clear and moist.  TMs and canals are clear.   Eyes: Conjunctivae and EOM are normal. Pupils are equal, round, and reactive to light.  Neck: Neck supple. No thyromegaly present.  Cardiovascular: Normal rate, regular rhythm and normal heart sounds.  Pulmonary/Chest: Effort normal and breath sounds normal. She has no wheezes.  Lymphadenopathy:    She has no cervical adenopathy.  Neurological: She is alert and oriented to person, place, and time.  Skin: Skin is warm and dry.  Psychiatric: She has a normal mood and affect.          Assessment & Plan:  URI -likely viral.  Flu swab is negative.  Did encourage her to stay home from work today and do not return until she is fever free for at least 24 hours.  If not feeling a little better by the end of the week or early next week then please let me know or if she feels like it is turning into a sinus infection, which she tends to get often, then please call us back and let us know and we can call something in before she leaves for her trip next week.

## 2017-10-09 NOTE — Patient Instructions (Addendum)
Upper Respiratory Infection, Adult Most upper respiratory infections (URIs) are caused by a virus. A URI affects the nose, throat, and upper air passages. The most common type of URI is often called "the common cold." Follow these instructions at home:  Take medicines only as told by your doctor.  Gargle warm saltwater or take cough drops to comfort your throat as told by your doctor.  Use a warm mist humidifier or inhale steam from a shower to increase air moisture. This may make it easier to breathe.  Drink enough fluid to keep your pee (urine) clear or pale yellow.  Eat soups and other clear broths.  Have a healthy diet.  Rest as needed.  Go back to work when your fever is gone or your doctor says it is okay. ? You may need to stay home longer to avoid giving your URI to others. ? You can also wear a face mask and wash your hands often to prevent spread of the virus.  Use your inhaler more if you have asthma.  Do not use any tobacco products, including cigarettes, chewing tobacco, or electronic cigarettes. If you need help quitting, ask your doctor. Contact a doctor if:  You are getting worse, not better.  Your symptoms are not helped by medicine.  You have chills.  You are getting more short of breath.  You have brown or red mucus.  You have yellow or brown discharge from your nose.  You have pain in your face, especially when you bend forward.  You have a fever.  You have puffy (swollen) neck glands.  You have pain while swallowing.  You have white areas in the back of your throat. Get help right away if:  You have very bad or constant: ? Headache. ? Ear pain. ? Pain in your forehead, behind your eyes, and over your cheekbones (sinus pain). ? Chest pain.  You have long-lasting (chronic) lung disease and any of the following: ? Wheezing. ? Long-lasting cough. ? Coughing up blood. ? A change in your usual mucus.  You have a stiff neck.  You have  changes in your: ? Vision. ? Hearing. ? Thinking. ? Mood. This information is not intended to replace advice given to you by your health care provider. Make sure you discuss any questions you have with your health care provider. Document Released: 01/24/2008 Document Revised: 04/09/2016 Document Reviewed: 11/12/2013 Elsevier Interactive Patient Education  2018 [employer]  

## 2017-11-02 ENCOUNTER — Telehealth: Payer: Self-pay

## 2017-11-02 MED ORDER — AZITHROMYCIN 250 MG PO TABS: ORAL_TABLET | ORAL | 0 refills | Status: DC

## 2017-11-02 NOTE — Telephone Encounter (Signed)
Pt has hx of recurrent sinusitis. Ok to send zpak 2 tablets now and then one tablet for 4 days. #6 NRF. Continue nasal spray. If not improving need follow up appt. Also due to recurrent nature need to consider ENT referral for evaluation.

## 2017-11-02 NOTE — Telephone Encounter (Signed)
RX sent to pharmacy per prescribers directions. Pt advised of results. States she will continue with nose spray and will call for OV if she is not improved. She will consider ENT referral and discuss further with provider at next visit. No further questions or needs at this time.

## 2017-11-02 NOTE — Telephone Encounter (Signed)
Pt left VM stating she was seen 10-09-17 for flu like SX. Pt's flu swab was negative but pt reports she never fully improved. Pt complains of persistent sinus drip, cough, and HA.  Pt was advised at last OV "If not feeling a little better by the end of the week or early next week then please let me know or if she feels like it is turning into a sinus infection, which she tends to get often, then please call us back and let us know and we can call something in before she leaves for her trip next week.".   Pt currently taking Allegra and Mucinex DM to treat SX, but wanting to know if there is anything else she can be doing.  Msg to Dr Madilyn Fireman who last saw pt and to her PCP, Jade.

## 2017-11-15 ENCOUNTER — Ambulatory Visit: Payer: BC Managed Care – PPO | Admitting: Physician Assistant

## 2017-11-15 ENCOUNTER — Encounter: Payer: Self-pay | Admitting: Physician Assistant

## 2017-11-15 VITALS — BP 124/83 | HR 69 | Temp 98.9°F | Wt 153.0 lb

## 2017-11-15 DIAGNOSIS — B9689 Other specified bacterial agents as the cause of diseases classified elsewhere: Secondary | ICD-10-CM

## 2017-11-15 DIAGNOSIS — J9801 Acute bronchospasm: Secondary | ICD-10-CM | POA: Diagnosis not present

## 2017-11-15 DIAGNOSIS — J019 Acute sinusitis, unspecified: Secondary | ICD-10-CM | POA: Diagnosis not present

## 2017-11-15 DIAGNOSIS — J3089 Other allergic rhinitis: Secondary | ICD-10-CM

## 2017-11-15 MED ORDER — CEFUROXIME AXETIL 250 MG PO TABS: 250.0000 mg | ORAL_TABLET | Freq: Two times a day (BID) | ORAL | 0 refills | Status: AC

## 2017-11-15 MED ORDER — BENZONATATE 100 MG PO CAPS: 100.0000 mg | ORAL_CAPSULE | Freq: Three times a day (TID) | ORAL | 0 refills | Status: DC | PRN

## 2017-11-15 MED ORDER — ALBUTEROL SULFATE HFA 108 (90 BASE) MCG/ACT IN AERS: 1.0000 | INHALATION_SPRAY | RESPIRATORY_TRACT | 0 refills | Status: DC | PRN

## 2017-11-15 MED ORDER — FLUTICASONE PROPIONATE 50 MCG/ACT NA SUSP: 1.0000 | Freq: Every day | NASAL | 0 refills | Status: DC

## 2017-11-15 NOTE — Progress Notes (Signed)
HPI:                                                                Lynn Lloyd is a 62 y.o. female who presents to [facility]: [place] today for persistent cough and congestion  Patient with PMH of allergic rhinitis, recurrent sinusitis, mold allergy presents with 8 weeks of persistent congestion, facial pressure, right-sided otalgia, post-nasal drip, and hacking cough. This is a recurrent problem. Symptoms feel worse on the right. Feels like she was improving and then reports second sickening about 1 week ago with chills, fatigue and malaise. Cough is interfering with work. She was treated by PCP with Azithromycin and did not notice any improvement. Has tried multiple OTC antihistamines and cough suppressants without relief. Reports family hx of COPD in her mother and this concerns her because she was exposed to second hand smoke as a child.   No flowsheet data found.    Past Medical History:  Diagnosis Date  . Chronic kidney disease   . Constipation   . Endometriosis   . Hemorrhoid    Past Surgical History:  Procedure Laterality Date  . BUNIONECTOMY  12/11  . CHOLECYSTECTOMY    . LAPAROSCOPY     endometriosis  . SALIVARY GLAND SURGERY     removal  . TONSILLECTOMY AND ADENOIDECTOMY     Social History   Tobacco Use  . Smoking status: Never Smoker  . Smokeless tobacco: Never Used  Substance Use Topics  . Alcohol use: Yes    Alcohol/week: 1.0 oz    Types: 2 Standard drinks or equivalent per week    Comment: Occ   family history includes Cancer in her mother; Hypertension in her mother; Parkinsonism in her father; Thyroid disease in her father and mother.    ROS: negative except as noted in the HPI  Medications: Current Outpatient Medications  Medication Sig Dispense Refill  . AMBULATORY NON FORMULARY MEDICATION Tumeric 500 mg    . Biotin 10000 MCG TABS Take by mouth.    . Magnesium 250 MG TABS Take by mouth.    .  Multiple Vitamin (MULTIVITAMIN) tablet Take 1 tablet by mouth daily.    . Zinc 50 MG CAPS Take by mouth.     No current facility-administered medications for this visit.    Allergies  Allergen Reactions  . Amoxicillin-Pot Clavulanate     "get really spaced out" pharmacist said was due to clavulanate  . Codeine Nausea Only  . Latex Other (See Comments)    whelps       Objective:  BP 124/83   Pulse 69   Temp 98.9 F (37.2 C) (Oral)   Wt 153 lb (69.4 kg)   SpO2 98%   BMI 23.26 kg/m  Gen:  alert, ill-appearing, not toxic-appearing, no distress, appropriate for age 62: head normocephalic without obvious abnormality, conjunctiva and cornea clear, TM's clear bilaterally, nasal mucosa edematous, right maxillary sinus tenderness, oropharynx clear, moist mucous membranes, neck supple, no cervical adenopathy, trachea midline Pulm: Normal work of breathing, normal phonation, clear to auscultation bilaterally, no wheezes, rales or rhonchi CV: Normal rate, regular rhythm, s1 and s2 distinct, no murmurs, clicks or rubs  Neuro: alert and oriented x 3, no tremor MSK:  extremities atraumatic, normal gait and station Skin: intact, no rashes on exposed skin, no jaundice, no cyanosis     No results found for this or any previous visit (from the past 72 hour(s)). No results found.    Assessment and Plan: 62 y.o. female with   1. Acute bacterial sinusitis - cefUROXime (CEFTIN) 250 MG tablet; Take 1 tablet (250 mg total) by mouth 2 (two) times daily with a meal for 10 days.  Dispense: 20 tablet; Refill: 0 - fluticasone (FLONASE) 50 MCG/ACT nasal spray; Place 1 spray into both nostrils daily.  Dispense: 16 g; Refill: 0  2. Bronchospasm - discussed possibility of reactive airway disease given that she develops bronchitis with URI's each year and she has chronic allergies. Lungs CTA today. SpO2 98% on RA. Recommend f/u spirometry when she is feeling improved - benzonatate (TESSALON) 100 MG  capsule; Take 1-2 capsules (100-200 mg total) by mouth 3 (three) times daily as needed for cough.  Dispense: 40 capsule; Refill: 0 - albuterol (PROVENTIL HFA;VENTOLIN HFA) 108 (90 Base) MCG/ACT inhaler; Inhale 1-2 puffs into the lungs every 4 (four) hours as needed (bronchospasm).  Dispense: 1 Inhaler; Refill: 0  3. Environmental and seasonal allergies - continue antihistamine, Flonase - Ambulatory referral to Allergy  4. Allergic rhinitis 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 - Ambulatory referral to Allergy   Patient education and anticipatory guidance given Patient agrees with treatment plan Follow-up as needed if symptoms worsen or fail to improve  Darlyne Russian PA-C

## 2017-11-15 NOTE — Patient Instructions (Signed)
Bronchospasm, Adult Bronchospasm is a tightening of the airways going into the lungs. During an episode, it may be harder to breathe. You may cough, and you may make a whistling sound when you breathe (wheeze). This condition often affects people with asthma. What are the causes? This condition is caused by swelling and irritation in the airways. It can be triggered by:  An infection (common).  Seasonal allergies.  An allergic reaction.  Exercise.  Irritants. These include pollution, cigarette smoke, strong odors, aerosol sprays, and paint fumes.  Weather changes. Winds increase molds and pollens in the air. Cold air may cause swelling.  Stress and emotional upset.  What are the signs or symptoms? Symptoms of this condition include:  Wheezing. If the episode was triggered by an allergy, wheezing may start right away or hours later.  Nighttime coughing.  Frequent or severe coughing with a simple cold.  Chest tightness.  Shortness of breath.  Decreased ability to exercise.  How is this diagnosed? This condition is usually diagnosed with a review of your medical history and a physical exam. Tests, such as lung function tests, are sometimes done to look for other conditions. The need for a chest X-ray depends on where the wheezing occurs and whether it is the first time you have wheezed. How is this treated? This condition may be treated with:  Inhaled medicines. These open up the airways and help you breathe. They can be taken with an inhaler or a nebulizer device.  Corticosteroid medicines. These may be given for severe bronchospasm, usually when it is associated with asthma.  Avoiding triggers, such as irritants, infection, or allergies.  Follow these instructions at home: Medicines  Take over-the-counter and prescription medicines only as told by your health care provider.  If you need to use an inhaler or nebulizer to take your medicine, ask your health care  provider to explain how to use it correctly. If you were given a spacer, always use it with your inhaler. Lifestyle  Reduce the number of triggers in your home. To do this: ? Change your heating and air conditioning filter at least once a month. ? Limit your use of fireplaces and wood stoves. ? Do not smoke. Do not allow smoking in your home. ? Avoid using perfumes and fragrances. ? Get rid of pests, such as roaches and mice, and their droppings. ? Remove any mold from your home. ? Keep your house clean and dust free. Use unscented cleaning products. ? Replace carpet with wood, tile, or vinyl flooring. Carpet can trap dander and dust. ? Use allergy-proof pillows, mattress covers, and box spring covers. ? Wash bed sheets and blankets every week in hot water. Dry them in a dryer. ? Use blankets that are made of polyester or cotton. ? Wash your hands often. ? Do not allow pets in your bedroom.  Avoid breathing in cold air when you exercise. General instructions  Have a plan for seeking medical care. Know when to call your health care provider and local emergency services, and where to get emergency care.  Stay up to date on your immunizations.  When you have an episode of bronchospasm, stay calm. Try to relax and breathe more slowly.  If you have asthma, make sure you have an asthma action plan.  Keep all follow-up visits as told by your health care provider. This is important. Contact a health care provider if:  You have muscle aches.  You have chest pain.  The mucus that you   cough up (sputum) changes from clear or white to yellow, green, gray, or bloody.  You have a fever.  Your sputum gets thicker. Get help right away if:  Your wheezing and coughing get worse, even after you take your prescribed medicines.  It gets even harder to breathe.  You develop severe chest pain. Summary  Bronchospasm is a tightening of the airways going into the lungs.  During an episode of  bronchospasm, you may have a harder time breathing. You may cough and make a whistling sound when you breathe (wheeze).  Avoid exposure to triggers such as smoke, dust, mold, animal dander, and fragrances.  When you have an episode of bronchospasm, stay calm. Try to relax and breathe more slowly. This information is not intended to replace advice given to you by your health care provider. Make sure you discuss any questions you have with your health care provider. Document Released: 08/10/2003 Document Revised: 08/03/2016 Document Reviewed: 08/03/2016 Elsevier Interactive Patient Education  2017 Elsevier Inc.  

## 2017-12-07 ENCOUNTER — Other Ambulatory Visit: Payer: Self-pay | Admitting: Physician Assistant

## 2017-12-07 DIAGNOSIS — J019 Acute sinusitis, unspecified: Principal | ICD-10-CM

## 2017-12-07 DIAGNOSIS — B9689 Other specified bacterial agents as the cause of diseases classified elsewhere: Secondary | ICD-10-CM

## 2017-12-12 ENCOUNTER — Other Ambulatory Visit: Payer: Self-pay | Admitting: Physician Assistant

## 2017-12-12 DIAGNOSIS — J9801 Acute bronchospasm: Secondary | ICD-10-CM

## 2017-12-17 ENCOUNTER — Encounter: Payer: Self-pay | Admitting: Allergy

## 2017-12-17 ENCOUNTER — Ambulatory Visit: Payer: BC Managed Care – PPO | Admitting: Allergy

## 2017-12-17 VITALS — BP 116/72 | HR 68 | Temp 98.3°F | Resp 18 | Ht 67.0 in | Wt 153.4 lb

## 2017-12-17 DIAGNOSIS — J301 Allergic rhinitis due to pollen: Secondary | ICD-10-CM | POA: Diagnosis not present

## 2017-12-17 DIAGNOSIS — J45990 Exercise induced bronchospasm: Secondary | ICD-10-CM

## 2017-12-17 DIAGNOSIS — T781XXA Other adverse food reactions, not elsewhere classified, initial encounter: Secondary | ICD-10-CM

## 2017-12-17 MED ORDER — FLUTICASONE PROPIONATE 93 MCG/ACT NA EXHU: 2.0000 | INHALANT_SUSPENSION | Freq: Two times a day (BID) | NASAL | 5 refills | Status: DC

## 2017-12-17 MED ORDER — LEVOCETIRIZINE DIHYDROCHLORIDE 5 MG PO TABS: 5.0000 mg | ORAL_TABLET | Freq: Every evening | ORAL | 5 refills | Status: DC

## 2017-12-17 NOTE — Patient Instructions (Addendum)
Allergic rhinitis    - environmental allergy skin testing is positive to grasses, mugwort weed, trees, molds, cockroach.  Allergen avoidance measures discussed/handout provided.      - recommend use XHance flonase 2 sprays each nostril twice a day.  Demonstrated proper of use device today.  This device will allow for better deposition of medication into the sinuses for more efficacy.    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  May use 15-20 minutes prior to activity.   Monitor frequency of use.    Adverse food reaction     - select food allergy testing is positive to soybean and chocolate.  Wheat is negative.  Would recommend avoidance of soybean and chocolate and see if this decreased bloating.  Would also continue avoidance of wheat as you may have intolerance.    Follow-up 6 months or sooner if needed

## 2017-12-17 NOTE — Progress Notes (Addendum)
New Patient Note  RE: Lynn Lloyd MRN: [id number] DOB: 1956-06-20 Date of Office Visit: 12/17/2017  Referring provider: Ottis Stain* Primary care provider: Donella Stade, PA-C  Chief Complaint: Environmental and seasonal allergies  History of present illness: Lynn Lloyd is a 62 y.o. female presenting today for consultation for allergic rhinitis.  Every year she gets nasal/sinus congestion, cough and eventually leads in to bronchitis and/or sinusitis.  Has been treated typically with antibiotics and sometimes steroids.  Symptoms are more prevalent around January-March.   She states this past Valentines she recalls having pasta with wheat that she tries to avoid and after that she started to develop sinus symptoms. She reports she had eustachian tube dysfunction in right ear as well and sometimes will have sinus HA.  This past weekend she did a lot of cleaning off her porch and visiting a vineyard and states that she is a bit more congested today due to that. She also states her current job she is in an older building and has seen roaches in the building.  Soon she will be moving to a newer building while this current building will be renovated.  She has taken Flonase and antihistamine before.  Flonase has been helpful to a degree with sinus congestion.  She has tried claritin, Human resources officer, zyr soybean and chocolate Ake her drowsy.    She has been tested for allergies on several occasions in different areas ([place], [place] in [place]) she has lived in and states she has been sensitive to molds, grasses.  Last tested about a decade ago.  She states s has undergone allergy shots in [place], [place] and in [place] for "years".  She does recall a stent from 97-2004 doing allergy shots where she performed them at home.  She also recalls a period where she was receiving her injections at the [facility].  She was seen by her PCP about a month ago and was treated for  an acute bacterial sinusitis with Ceftin.  At that visit she also complained of cough and chest congestion.  She states that she would develop bronchitis with URIs each year.  She has been provided with an albuterol inhaler in the past for the bronchitis episodes.  She states she only uses the albuterol during these times.  She states she was diagnosed about 30 years ago with exercise-induced asthma.   She reports she avoids wheat/gluten based on previous testing.   She does report bloating with other foods but does not no of any particular foods that cause the symptoms.    She does report having a dull achy pain on the right lower quadrant.  She states she has been having chills/hot sweats but is is menopause and was attributing that to menopause.  Denies any dysuria or frequency.  States her stooling has been normal for her.  She states she is planning to get this evaluated.    Review of systems: Review of Systems  Constitutional: Negative for fever, malaise/fatigue and weight loss.  HENT: Positive for congestion. Negative for ear discharge, ear pain, hearing loss, nosebleeds, sinus pain, sore throat and tinnitus.   Eyes: Negative for pain, discharge and redness.  Respiratory: Negative for cough, shortness of breath and wheezing.   Cardiovascular: Negative for chest pain.  Gastrointestinal: Negative for abdominal pain, constipation, diarrhea, heartburn, nausea and vomiting.  Musculoskeletal: Negative for joint pain.  Skin: Negative for itching and rash.  Neurological: Negative for headaches.  All other systems negative unless noted above in HPI  Past medical history: Past Medical History:  Diagnosis Date  . Chronic kidney disease   . Constipation   . Endometriosis   . Hemorrhoid     Past surgical history: Past Surgical History:  Procedure Laterality Date  . ADENOIDECTOMY    . BUNIONECTOMY  12/11  . CHOLECYSTECTOMY    . LAPAROSCOPY     endometriosis  . SALIVARY GLAND  SURGERY     removal  . TONSILLECTOMY AND ADENOIDECTOMY      Family history:  Family History  Problem Relation Age of Onset  . Thyroid disease Father   . Parkinsonism Father   . Cancer Mother        acl  . Hypertension Mother   . Thyroid disease Mother   . Asthma Mother   . COPD Mother   . Allergic rhinitis Mother   Positive to sweet vernal grass  Social history: She lives in a home with carpeting in bedroom with gas heating and central cooling.  No pets in the home.  No concern for water damage, mildew or roaches in the home.  She works as an Engineer, maintenance (IT) in business ops.  She has not smoking history or exposure.   Medication List: Allergies as of 12/17/2017      Reactions   Amoxicillin-pot Clavulanate    "get really spaced out" pharmacist said was due to clavulanate   Codeine Nausea Only   Latex Other (See Comments)   whelps      Medication List        Accurate as of 12/17/17  4:47 PM. Always use your most recent med list.          albuterol 108 (90 Base) MCG/ACT inhaler Commonly known as:  PROVENTIL HFA;VENTOLIN HFA Inhale 1-2 puffs into the lungs every 4 (four) hours as needed (bronchospasm).   Biotin 10000 MCG Tabs Take by mouth.   Fluticasone Propionate 93 MCG/ACT Exhu Commonly known as:  XHANCE Place 2 sprays into the nose 2 (two) times daily.   ibuprofen 200 MG tablet Commonly known as:  ADVIL,MOTRIN Take 400 mg by mouth every 6 (six) hours as needed.   levocetirizine 5 MG tablet Commonly known as:  XYZAL Take 1 tablet (5 mg total) by mouth every evening.   Magnesium 250 MG Tabs Take by mouth.   multivitamin tablet Take 1 tablet by mouth daily.   Turmeric 500 MG Caps Take 500 mg by mouth.   vitamin C 500 MG tablet Commonly known as:  ASCORBIC ACID Take 500 mg by mouth daily.   Vitamin D3 1000 units Caps Take 1,000 Units by mouth daily.   Zinc 50 MG Caps Take by mouth.       Known medication allergies: Allergies  Allergen  Reactions  . Amoxicillin-Pot Clavulanate     "get really spaced out" pharmacist said was due to clavulanate  . Codeine Nausea Only  . Latex Other (See Comments)    whelps     Physical examination: Blood pressure 116/72, pulse 68, temperature 98.3 F (36.8 C), resp. rate 18, height 5\' 7"  (1.702 m), weight 153 lb 6.4 oz (69.6 kg), SpO2 97 %.  General: Alert, interactive, in no acute distress. HEENT: PERRLA, TMs pearly gray, turbinates moderately edematous without discharge, post-pharynx non erythematous. Neck: Supple without lymphadenopathy. Lungs: Clear to auscultation without wheezing, rhonchi or rales. {no increased work of breathing. CV: Normal S1, S2 without murmurs. Abdomen: Nondistended, nontender. Skin: Warm and  dry, without lesions or rashes. Extremities:  No clubbing, cyanosis or edema. Neuro:   Grossly intact.  Diagnositics/Labs  Spirometry: FEV1: 2.33L  88%, FVC: 3.3L 97%, ratio consistent with nonobstructive pattern  Allergy testing: environmental allergy skin prick testing is positive to sweet vernal grass mugwort, birch, box elder, pecan pollen, molds. Intradermal testing is positive to [employer] and cockroach Select food allergy skin prick testing is soybean and chocolate Allergy testing results were read and interpreted by provider, documented by clinical staff.   Assessment and plan:   Allergic rhinitis    - environmental allergy skin testing is positive to grasses, mugwort weed, trees, molds, cockroach.  Allergen avoidance measures discussed/handout provided.      - recommend use XHance flonase 2 sprays each nostril twice a day.  Demonstrated proper of use device today.  This device will allow for better deposition of medication into the sinuses for more efficacy.    - recommend taking daily Xyzal 5mg  during late winter-spring (during your problematic seasons)   History of exercise-induced asthma and recurrent episodes of bronchitis    - have access to  albuterol inhaler 2 puffs every 4-6 hours as needed for cough/wheeze/shortness of breath/chest tightness.  May use 15-20 minutes prior to activity.   Monitor frequency of use.    Adverse food reaction     - select food allergy testing is positive to soybean and chocolate.  Wheat is negative.  Would recommend avoidance of soybean and chocolate and see if this decreases bloating.  Would also continue avoidance of wheat as you may have intolerance.    Follow-up 6 months or sooner if needed  I appreciate the opportunity to take part in [facility] care. Please do not hesitate to contact me with questions.  Sincerely,   Prudy Feeler, MD Allergy/Immunology [facility]

## 2018-01-04 ENCOUNTER — Emergency Department (INDEPENDENT_AMBULATORY_CARE_PROVIDER_SITE_OTHER)
Admission: EM | Admit: 2018-01-04 | Discharge: 2018-01-04 | Disposition: A | Discharge status: Home / Self Care | Payer: BC Managed Care – PPO | Source: Home / Self Care | Attending: Family Medicine | Admitting: Family Medicine

## 2018-01-04 ENCOUNTER — Other Ambulatory Visit: Payer: Self-pay

## 2018-01-04 ENCOUNTER — Emergency Department (INDEPENDENT_AMBULATORY_CARE_PROVIDER_SITE_OTHER): Payer: BC Managed Care – PPO

## 2018-01-04 DIAGNOSIS — R1031 Right lower quadrant pain: Secondary | ICD-10-CM | POA: Diagnosis not present

## 2018-01-04 DIAGNOSIS — Z8742 Personal history of other diseases of the female genital tract: Secondary | ICD-10-CM

## 2018-01-04 DIAGNOSIS — R42 Dizziness and giddiness: Secondary | ICD-10-CM

## 2018-01-04 LAB — POCT URINALYSIS DIP (MANUAL ENTRY)
Bilirubin, UA: NEGATIVE
Blood, UA: NEGATIVE
Glucose, UA: NEGATIVE mg/dL
Ketones, POC UA: NEGATIVE mg/dL
Leukocytes, UA: NEGATIVE
Nitrite, UA: NEGATIVE
Protein Ur, POC: NEGATIVE mg/dL
Spec Grav, UA: 1.015 (ref 1.010–1.025)
Urobilinogen, UA: 0.2 E.U./dL
pH, UA: 7 (ref 5.0–8.0)

## 2018-01-04 LAB — POCT CBC W AUTO DIFF (K'VILLE URGENT CARE)

## 2018-01-04 LAB — COMPLETE METABOLIC PANEL WITH GFR
AG Ratio: 1.7 (calc) (ref 1.0–2.5)
ALT: 23 U/L (ref 6–29)
AST: 21 U/L (ref 10–35)
Albumin: 4.2 g/dL (ref 3.6–5.1)
Alkaline phosphatase (APISO): 69 U/L (ref 33–130)
BUN: 11 mg/dL (ref 7–25)
CO2: 27 mmol/L (ref 20–32)
Calcium: 9.4 mg/dL (ref 8.6–10.4)
Chloride: 108 mmol/L (ref 98–110)
Creat: 0.67 mg/dL (ref 0.50–0.99)
GFR, Est African American: 108 mL/min/{1.73_m2} (ref >=60)
GFR, Est Non African American: 94 mL/min/{1.73_m2} (ref >=60)
Globulin: 2.5 g/dL (calc) (ref 1.9–3.7)
Glucose, Bld: 102 mg/dL — ABNORMAL HIGH (ref 65–99)
Potassium: 5 mmol/L (ref 3.5–5.3)
Sodium: 142 mmol/L (ref 135–146)
Total Bilirubin: 0.6 mg/dL (ref 0.2–1.2)
Total Protein: 6.7 g/dL (ref 6.1–8.1)

## 2018-01-04 NOTE — Discharge Instructions (Signed)
°  The pain in your Right lower side may be due to your hip-flexor muscles.  These can get tight sometimes because most people spend a lot of time sitting for work during the day.  Please try the hip exercises at home to help stretch these muscles.    Please follow up with your primary care provider in 1 week if not improving.  She may refer you to physical therapy where they can possibly try other treatments such as dry needling, which essentially uses a small needle to help allow the trigger point (center of tight muscle) to relax.    It does not seem like the nausea and bloating would be related to your side pain.  Please discuss these symptoms with your family doctor if not improving.  A stool softener may help some and making sure you stay well hydrated.

## 2018-01-04 NOTE — ED Provider Notes (Signed)
[name]    CSN: [id number] Arrival date & time: 01/04/18  0955     History   Chief Complaint No chief complaint on file.   HPI Lynn Lloyd is a 62 y.o. female.   HPI Lynn Lloyd is a 62 y.o. female presenting to UC with c/o RLQ abdominal pain that has been present for about 4 weeks. Pain was initially sharp but is now aching and sore, mild to moderate in severity.  She is also c/o intermittent abdominal bloating, nausea, and loose stool.  She had 3 episodes of loose stool today.  She reports having a 24 stomach bug about 3 weeks ago after going to a wedding where 30 out of 86 other people had GI symptoms as well. Those symptoms resolved until having nausea and loose stool today.  The pain in her RLQ is most bothersome for her.  The pain started prior to her getting sick after the wedding.  Denies fever, chills or vomiting.  Denies hx of abdominal surgeries.  She does have a hx of endometriosis and has had ovarian cysts in the past. She also notes an aunt was recently dx with ovarian cancer and her daughter had a large uterine mass removed in the past.  Pt has not f/u with her PCP or OB/GYN for current symptoms.  She has seen her chiropractor for the RLQ abdominal pain.  He advised her to try not to sit for long periods of time and to stretch more.  This has not helped much.    Past Medical History:  Diagnosis Date  . Chronic kidney disease   . Constipation   . Endometriosis   . Hemorrhoid     Patient Active Problem List   Diagnosis Date Noted  . Bronchospasm 11/15/2017  . Allergic rhinitis 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 11/15/2017  . Environmental and seasonal allergies 11/15/2017  . Encounter for adjustment or management of vascular access device 09/13/2016  . Right shoulder pain 03/02/2016  . Right knee pain 05/06/2015  . Post concussion syndrome 11/07/2013  . Abdominal pain, other specified site 10/03/2010  . POSTMENOPAUSAL SYNDROME 12/15/2009  . FINGER PAIN  10/22/2009  . PAIN IN JOINT, MULTIPLE SITES 09/28/2009  . TINNITUS 07/13/2009  . EAR PAIN, RIGHT 07/13/2009  . SOLAR KERATOSIS 03/31/2009  . SEBORRHEIC KERATOSIS 08/12/2008  . NEVUS, ATYPICAL 01/09/2008  . POSTMENOPAUSAL STATUS 12/06/2007  . Other malaise and fatigue 12/06/2007  . WARTS, MULTIPLE 09/09/2007  . SPINAL STENOSIS, CERVICAL 09/09/2007  . NEOPLASM, SKIN, UNCERTAIN BEHAVIOR 61/44/3154    Past Surgical History:  Procedure Laterality Date  . ADENOIDECTOMY    . BUNIONECTOMY  12/11  . CHOLECYSTECTOMY    . LAPAROSCOPY     endometriosis  . SALIVARY GLAND SURGERY     removal  . TONSILLECTOMY AND ADENOIDECTOMY      OB History    Gravida  3   Para  3   Term  3   Preterm      AB      Living  4     SAB      TAB      Ectopic      Multiple  1   Live Births               Home Medications    Prior to Admission medications   Medication Sig Start Date End Date Taking? Authorizing Provider  albuterol (PROVENTIL HFA;VENTOLIN HFA) 108 (90 Base) MCG/ACT inhaler Inhale 1-2 puffs into the  lungs every 4 (four) hours as needed (bronchospasm). Patient not taking: Reported on 12/17/2017 11/15/17   Trixie Dredge, PA-C  Biotin 10000 MCG TABS Take by mouth.    [provider]  Cholecalciferol (VITAMIN D3) 1000 units CAPS Take 1,000 Units by mouth daily.    [provider]  Fluticasone Propionate (XHANCE) 93 MCG/ACT EXHU Place 2 sprays into the nose 2 (two) times daily. 12/17/17   Kennith Gain, MD  ibuprofen (ADVIL,MOTRIN) 200 MG tablet Take 400 mg by mouth every 6 (six) hours as needed.    [provider]  levocetirizine (XYZAL) 5 MG tablet Take 1 tablet (5 mg total) by mouth every evening. 12/17/17   Kennith Gain, MD  Magnesium 250 MG TABS Take by mouth.    [provider]  Multiple Vitamin (MULTIVITAMIN) tablet Take 1 tablet by mouth daily.    [provider]  Turmeric 500 MG CAPS Take  500 mg by mouth.    [provider]  vitamin C (ASCORBIC ACID) 500 MG tablet Take 500 mg by mouth daily.    [provider]  Zinc 50 MG CAPS Take by mouth.    [provider]    Family History Family History  Problem Relation Age of Onset  . Thyroid disease Father   . Parkinsonism Father   . Cancer Mother        acl  . Hypertension Mother   . Thyroid disease Mother   . Asthma Mother   . COPD Mother   . Allergic rhinitis Mother     Social History Social History   Tobacco Use  . Smoking status: Never Smoker  . Smokeless tobacco: Never Used  Substance Use Topics  . Alcohol use: Yes    Alcohol/week: 1.0 oz    Types: 2 Standard drinks or equivalent per week    Comment: Occ  . Drug use: No     Allergies   Amoxicillin-pot clavulanate; Codeine; and Latex   Review of Systems Review of Systems  Constitutional: Positive for appetite change (several small meals or snacks throughout the day.). Negative for chills, diaphoresis, fatigue, fever and unexpected weight change.  Gastrointestinal: Positive for abdominal distention ( bloating), abdominal pain, constipation, diarrhea and nausea. Negative for vomiting.  Genitourinary: Positive for frequency. Negative for dysuria, flank pain, hematuria, pelvic pain, urgency, vaginal bleeding, vaginal discharge and vaginal pain.  Musculoskeletal: Negative for back pain and myalgias.     Physical Exam Triage Vital Signs ED Triage Vitals  Enc Vitals Group     BP 01/04/18 1016 128/81     Pulse Rate 01/04/18 1016 62     Resp 01/04/18 1016 18     Temp 01/04/18 1016 98.4 F (36.9 C)     Temp Source 01/04/18 1016 Oral     SpO2 01/04/18 1016 100 %     Weight 01/04/18 1017 148 lb (67.1 kg)     Height 01/04/18 1017 5\' 8"  (1.727 m)     Head Circumference --      Peak Flow --      Pain Score 01/04/18 1016 0     Pain Loc --      Pain Edu? --      Excl. in GC? --    Orthostatic VS for the past 24 hrs:  BP- Lying  Pulse- Lying BP- Sitting Pulse- Sitting BP- Standing at 0 minutes Pulse- Standing at 0 minutes  01/04/18 1208 136/87 56 128/79 58 118/79 64  Updated Vital Signs BP 128/81 (BP Location: Right Arm)   Pulse 62   Temp 98.4 F (36.9 C) (Oral)   Resp 18   Ht 5\' 8"  (1.727 m)   Wt 148 lb (67.1 kg)   SpO2 100%   BMI 22.50 kg/m   Visual Acuity Right Eye Distance:   Left Eye Distance:   Bilateral Distance:    Right Eye Near:   Left Eye Near:    Bilateral Near:     Physical Exam  Constitutional: She is oriented to person, place, and time. She appears well-developed and well-nourished. No distress.  HENT:  Head: Normocephalic and atraumatic.  Mouth/Throat: Oropharynx is clear and moist.  Eyes: EOM are normal.  Neck: Normal range of motion. Neck supple.  Cardiovascular: Normal rate and regular rhythm.  Pulmonary/Chest: Effort normal and breath sounds normal. No stridor. No respiratory distress. She has no wheezes. She has no rales.  Abdominal: Soft. Bowel sounds are normal. She exhibits no distension and no mass. There is tenderness ( mild, RLQ). There is no rebound, no guarding and no CVA tenderness.    Musculoskeletal: Normal range of motion.  Neurological: She is alert and oriented to person, place, and time. No cranial nerve deficit.  Skin: Skin is warm and dry. Capillary refill takes less than 2 seconds. She is not diaphoretic.  Psychiatric: She has a normal mood and affect. Her behavior is normal.  Nursing note and vitals reviewed.    UC Treatments / Results  Labs (all labs ordered are listed, but only abnormal results are displayed) Labs Reviewed  POCT URINALYSIS DIP (MANUAL ENTRY) - Abnormal; Notable for the following components:      Result Value   Color, UA light yellow (*)    All other components within normal limits  COMPLETE METABOLIC PANEL WITH GFR  POCT CBC W AUTO DIFF ([place])    EKG None  Radiology US Pelvic Doppler (torsion R/o Or Mass  Arterial Flow)  Result Date: 01/04/2018 CLINICAL DATA:  Right lower quadrant abdominal pain. EXAM: TRANSABDOMINAL AND TRANSVAGINAL ULTRASOUND OF PELVIS DOPPLER ULTRASOUND OF OVARIES TECHNIQUE: Both transabdominal and transvaginal ultrasound examinations of the pelvis were performed. Transabdominal technique was performed for global imaging of the pelvis including uterus, ovaries, adnexal regions, and pelvic cul-de-sac. It was necessary to proceed with endovaginal exam following the transabdominal exam to visualize the endometrium and ovaries. Color and duplex Doppler ultrasound was utilized to evaluate blood flow to the ovaries. COMPARISON:  None. FINDINGS: Uterus Measurements: 6.5 x 4.6 x 2.4 cm. No fibroids or other mass visualized. Endometrium Thickness: 1.8 mm which is within normal limits. No focal abnormality visualized. Right ovary Measurements: 2.6 x 1.4 x 1.4 cm. Normal appearance/no adnexal mass. Left ovary Unable to visualize due to overlying bowel. Pulsed Doppler evaluation of right ovary demonstrates normal low-resistance arterial and venous waveforms. Other findings No abnormal free fluid.  Possible left adnexal varices are noted. IMPRESSION: Left ovary not visualized due to overlying bowel. Possible left pelvic varices are noted. No other abnormality seen in the pelvis. Electronically Signed   By: Marijo Conception, M.D.   On: 01/04/2018 12:19   US Pelvic Complete With Transvaginal  Result Date: 01/04/2018 CLINICAL DATA:  Right lower quadrant abdominal pain. EXAM: TRANSABDOMINAL AND TRANSVAGINAL ULTRASOUND OF PELVIS DOPPLER ULTRASOUND OF OVARIES TECHNIQUE: Both transabdominal and transvaginal ultrasound examinations of the pelvis were performed. Transabdominal technique was performed for global imaging of the pelvis including uterus, ovaries, adnexal regions, and pelvic cul-de-sac.  It was necessary to proceed with endovaginal exam following the transabdominal exam to visualize the endometrium and  ovaries. Color and duplex Doppler ultrasound was utilized to evaluate blood flow to the ovaries. COMPARISON:  None. FINDINGS: Uterus Measurements: 6.5 x 4.6 x 2.4 cm. No fibroids or other mass visualized. Endometrium Thickness: 1.8 mm which is within normal limits. No focal abnormality visualized. Right ovary Measurements: 2.6 x 1.4 x 1.4 cm. Normal appearance/no adnexal mass. Left ovary Unable to visualize due to overlying bowel. Pulsed Doppler evaluation of right ovary demonstrates normal low-resistance arterial and venous waveforms. Other findings No abnormal free fluid.  Possible left adnexal varices are noted. IMPRESSION: Left ovary not visualized due to overlying bowel. Possible left pelvic varices are noted. No other abnormality seen in the pelvis. Electronically Signed   By: Marijo Conception, M.D.   On: 01/04/2018 12:19    Procedures Procedures (including critical care time)  Medications Ordered in UC Medications - No data to display  Initial Impression / Assessment and Plan / UC Course  I have reviewed the triage vital signs and the nursing notes.  Pertinent labs & imaging results that were available during my care of the patient were reviewed by me and considered in my medical decision making (see chart for details).     No definitive cause of pt's reported lightheadedness.  Vitals including orthostatic vitals: WNL  Abdominal exam not concerning for surgical abdomen. Reviewed U/S with pt. Encouraged f/u with OB/GYN for adnexal varices  Encouraged f/u with PCP for symptoms if not improving in 1 week, sooner if worsening.  Home care instructions provided below.    Final Clinical Impressions(s) / UC Diagnoses   Final diagnoses:  RLQ abdominal pain  Lightheadedness     Discharge Instructions      The pain in your Right lower side may be due to your hip-flexor muscles.  These can get tight sometimes because most people spend a lot of time sitting for work during the day.   Please try the hip exercises at home to help stretch these muscles.    Please follow up with your primary care provider in 1 week if not improving.  She may refer you to physical therapy where they can possibly try other treatments such as dry needling, which essentially uses a small needle to help allow the trigger point (center of tight muscle) to relax.    It does not seem like the nausea and bloating would be related to your side pain.  Please discuss these symptoms with your family doctor if not improving.  A stool softener may help some and making sure you stay well hydrated.     ED Prescriptions    None     Controlled Substance Prescriptions [place] Controlled Substance Registry consulted? Not Applicable   Tyrell Antonio 01/04/18 1352

## 2018-01-04 NOTE — ED Triage Notes (Signed)
Pt c/o intermittent abdominal pain and bloating. Limited capability of eating. On 5/6, pt went to a wedding and 30 ppl got sick after eating with fever, N/D. Feels light headed today. BCG was 94.

## 2018-01-07 ENCOUNTER — Telehealth: Payer: Self-pay | Admitting: Emergency Medicine

## 2018-01-18 ENCOUNTER — Ambulatory Visit: Payer: BC Managed Care – PPO | Admitting: Physician Assistant

## 2018-01-18 ENCOUNTER — Ambulatory Visit (INDEPENDENT_AMBULATORY_CARE_PROVIDER_SITE_OTHER): Payer: BC Managed Care – PPO

## 2018-01-18 ENCOUNTER — Encounter: Payer: Self-pay | Admitting: Physician Assistant

## 2018-01-18 VITALS — BP 132/80 | HR 71 | Ht 68.0 in | Wt 150.0 lb

## 2018-01-18 DIAGNOSIS — K5904 Chronic idiopathic constipation: Secondary | ICD-10-CM | POA: Diagnosis not present

## 2018-01-18 DIAGNOSIS — R5383 Other fatigue: Secondary | ICD-10-CM

## 2018-01-18 DIAGNOSIS — R1031 Right lower quadrant pain: Secondary | ICD-10-CM

## 2018-01-18 MED ORDER — LINACLOTIDE 290 MCG PO CAPS: 290.0000 ug | ORAL_CAPSULE | Freq: Every day | ORAL | 1 refills | Status: DC

## 2018-01-18 MED ORDER — IOPAMIDOL (ISOVUE-300) INJECTION 61%
100.0000 mL | Freq: Once | INTRAVENOUS | Status: AC | PRN
  Administered 2018-01-18: 100 mL via INTRAVENOUS

## 2018-01-18 NOTE — Progress Notes (Signed)
Subjective:    Patient ID: Lynn Lloyd, female    DOB: 11-Jul-1956, 62 y.o.   MRN: [id number]  HPI  Pt is a 62 yo pleasant post menopauseal female who presents to the clinic to follow up on RLQ pain. She has had this pain for 5 weeks. She has good and bad days. She went to [facility] on 5/17 for this and pelvic u/s was done that was normal. She has been to get a massage and seen a chiorpracter. They have suggested could be her hip flexor. Pt is very flexible and does yoga nearly every day. Yoga poses do not exacerbate pain. She does admit to batting constipation. She takes magnesium and colace as needed. She recent saw allergist that told her not to eat any soy or chocolate. She takes probiotic prn. Stools are very narrow. No melena or hematochezia. Colonoscopy 2015 normal. Gallbladder removed. Overall she feels drained with no energy. She is sleeping fairly well.   .. Active Ambulatory Problems    Diagnosis Date Noted  . WARTS, MULTIPLE 09/09/2007  . NEVUS, ATYPICAL 01/09/2008  . NEOPLASM, SKIN, UNCERTAIN BEHAVIOR 40/98/1191  . TINNITUS 07/13/2009  . EAR PAIN, RIGHT 07/13/2009  . POSTMENOPAUSAL STATUS 12/06/2007  . POSTMENOPAUSAL SYNDROME 12/15/2009  . SOLAR KERATOSIS 03/31/2009  . SEBORRHEIC KERATOSIS 08/12/2008  . PAIN IN JOINT, MULTIPLE SITES 09/28/2009  . SPINAL STENOSIS, CERVICAL 09/09/2007  . FINGER PAIN 10/22/2009  . Other malaise and fatigue 12/06/2007  . Abdominal pain, other specified site 10/03/2010  . Post concussion syndrome 11/07/2013  . Right knee pain 05/06/2015  . Right shoulder pain 03/02/2016  . Encounter for adjustment or management of vascular access device 09/13/2016  . Bronchospasm 11/15/2017  . Allergic rhinitis 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 11/15/2017  . Environmental and seasonal allergies 11/15/2017  . Chronic idiopathic constipation 01/18/2018  . RLQ abdominal pain 01/18/2018  . No energy 01/20/2018   Resolved Ambulatory Problems    Diagnosis Date Noted  . SINUSITIS-  ACUTE-NOS 11/12/2007  . ALLERGIC RHINITIS 11/12/2007  . Sialolithiasis 07/24/2008  . Acute cystitis 01/16/2008  . CERVICAL RADICULOPATHY 11/07/2006  . ABDOMINAL BLOATING 12/15/2009  . Dysuria 03/21/2010  . FREQUENCY, URINARY 04/20/2010  . Nausea alone 10/03/2010  . Recurrent maxillary sinusitis 09/22/2016   Past Medical History:  Diagnosis Date  . Chronic kidney disease   . Constipation   . Endometriosis   . Hemorrhoid        Review of Systems  All other systems reviewed and are negative.      Objective:   Physical Exam  Constitutional: She is oriented to person, place, and time. She appears well-developed and well-nourished.  HENT:  Head: Normocephalic and atraumatic.  Cardiovascular: Normal rate and regular rhythm.  Pulmonary/Chest: Effort normal and breath sounds normal.  Abdominal: Soft. Bowel sounds are normal. She exhibits distension. She exhibits no mass. There is tenderness. There is no rebound and no guarding. No hernia.  RLQ tenderness to palpation and around umbilical.  Negative psoas sign.  No pain over hip flexer to resistance.   Neurological: She is alert and oriented to person, place, and time.  Psychiatric: She has a normal mood and affect. Her behavior is normal.          Assessment & Plan:  Marland KitchenMarland KitchenDiagnoses and all orders for this visit:  RLQ abdominal pain -     CT Abdomen Pelvis W Contrast  No energy -     TSH -     B12 and Folate Panel -  COMPLETE METABOLIC PANEL WITH GFR -     CBC with Differential/Platelet -     Fe+TIBC+Fer  Chronic idiopathic constipation -     linaclotide (LINZESS) 290 MCG CAPS capsule; Take 1 capsule (290 mcg total) by mouth daily before breakfast.   Will get labs to evaluate no energy.   STAT CT showed no acute findings. Will treat constipation for now and patient will continue to work on strengthing/stretching muscles. linzess sent to pharmacy. Discussed side effects. Follow up in 1 month.   Marland Kitchen.Spent 30  minutes with patient and greater than 50 percent of visit spent counseling patient regarding treatment plan.

## 2018-01-18 NOTE — Progress Notes (Signed)
Sent!

## 2018-01-18 NOTE — Patient Instructions (Signed)
Linaclotide oral capsules  What is this medicine?  LINACLOTIDE (lin a KLOE tide) is used to treat irritable bowel syndrome (IBS) with constipation as the main problem. It may also be used for relief of chronic constipation.  This medicine may be used for other purposes; ask your health care provider or pharmacist if you have questions.  COMMON BRAND NAME(S): Linzess  What should I tell my health care provider before I take this medicine?  They need to know if you have any of these conditions:  -history of stool (fecal) impaction  -now have diarrhea or have diarrhea often  -other medical condition  -stomach or intestinal disease, including bowel obstruction or abdominal adhesions  -an unusual or allergic reaction to linaclotide, other medicines, foods, dyes, or preservatives  -pregnant or trying to get pregnant  -breast-feeding  How should I use this medicine?  Take this medicine by mouth with a glass of water. Follow the directions on the prescription label. Do not cut, crush or chew this medicine. Take on an empty stomach, at least 30 minutes before your first meal of the day. Take your medicine at regular intervals. Do not take your medicine more often than directed. Do not stop taking except on your doctor's advice.  A special MedGuide will be given to you by the pharmacist with each prescription and refill. Be sure to read this information carefully each time.  Talk to your pediatrician regarding the use of this medicine in children. This medicine is not approved for use in children.  Overdosage: If you think you have taken too much of this medicine contact a poison control center or emergency room at once.  NOTE: This medicine is only for you. Do not share this medicine with others.  What if I miss a dose?  If you miss a dose, just skip that dose. Wait until your next dose, and take only that dose. Do not take double or extra doses.  What may interact with this medicine?  -certain medicines for bowel problems  or bladder incontinence (these can cause constipation)  This list may not describe all possible interactions. Give your health care provider a list of all the medicines, herbs, non-prescription drugs, or dietary supplements you use. Also tell them if you smoke, drink alcohol, or use illegal drugs. Some items may interact with your medicine.  What should I watch for while using this medicine?  Visit your doctor for regular check ups. Tell your doctor if your symptoms do not get better or if they get worse.  Diarrhea is a common side effect of this medicine. It often begins within 2 weeks of starting this medicine. Stop taking this medicine and call your doctor if you get severe diarrhea.  Stop taking this medicine and call your doctor or go to the nearest hospital emergency room right away if you develop unusual or severe stomach-area (abdominal) pain, especially if you also have bright red, bloody stools or black stools that look like tar.  What side effects may I notice from receiving this medicine?  Side effects that you should report to your doctor or health care professional as soon as possible:  -allergic reactions like skin rash, itching or hives, swelling of the face, lips, or tongue  -black, tarry stools  -bloody or watery diarrhea  -new or worsening stomach pain  -severe or prolonged diarrhea  Side effects that usually do not require medical attention (report to your doctor or health care professional if they continue or are   my medicine? Keep out of the reach of children. Store at room temperature between 20 and 25 degrees C (68 and 77 degrees F). Keep this medicine in the original container. Keep tightly closed in a dry place. Do not remove the desiccant packet from the bottle,  it helps to protect your medicine from moisture. Throw away any unused medicine after the expiration date. NOTE: This sheet is a summary. It may not cover all possible information. If you have questions about this medicine, talk to your doctor, pharmacist, or health care provider.  2018 Elsevier/Gold Standard (2015-09-09 12:17:04)  

## 2018-01-18 NOTE — Progress Notes (Signed)
CT is normal. No acute findings. I would like to try linzess. Are you ok with sending this over.

## 2018-01-19 LAB — CBC WITH DIFFERENTIAL/PLATELET
BASOS PCT: 0.9 %
Basophils Absolute: 59 cells/uL (ref 0–200)
EOS ABS: 72 {cells}/uL (ref 15–500)
Eosinophils Relative: 1.1 %
HCT: 39.5 % (ref 35.0–45.0)
Hemoglobin: 13.7 g/dL (ref 11.7–15.5)
Lymphs Abs: 1697 cells/uL (ref 850–3900)
MCH: 31.6 pg (ref 27.0–33.0)
MCHC: 34.7 g/dL (ref 32.0–36.0)
MCV: 91 fL (ref 80.0–100.0)
MPV: 11.2 fL (ref 7.5–12.5)
Monocytes Relative: 7.7 %
Neutro Abs: 4173 cells/uL (ref 1500–7800)
Neutrophils Relative %: 64.2 %
PLATELETS: 258 10*3/uL (ref 140–400)
RBC: 4.34 10*6/uL (ref 3.80–5.10)
RDW: 12.5 % (ref 11.0–15.0)
TOTAL LYMPHOCYTE: 26.1 %
WBC mixed population: 501 cells/uL (ref 200–950)
WBC: 6.5 10*3/uL (ref 3.8–10.8)

## 2018-01-19 LAB — IRON,TIBC AND FERRITIN PANEL
%SAT: 32 % (calc) (ref 11–50)
FERRITIN: 85 ng/mL (ref 20–288)
IRON: 90 ug/dL (ref 45–160)
TIBC: 278 mcg/dL (calc) (ref 250–450)

## 2018-01-19 LAB — COMPLETE METABOLIC PANEL WITH GFR
AG RATIO: 2 (calc) (ref 1.0–2.5)
ALKALINE PHOSPHATASE (APISO): 64 U/L (ref 33–130)
ALT: 15 U/L (ref 6–29)
AST: 16 U/L (ref 10–35)
Albumin: 4.3 g/dL (ref 3.6–5.1)
BUN: 9 mg/dL (ref 7–25)
CO2: 29 mmol/L (ref 20–32)
Calcium: 9.5 mg/dL (ref 8.6–10.4)
Chloride: 108 mmol/L (ref 98–110)
Creat: 0.76 mg/dL (ref 0.50–0.99)
GFR, Est African American: 97 mL/min/{1.73_m2} (ref >=60)
GFR, Est Non African American: 83 mL/min/{1.73_m2} (ref >=60)
GLOBULIN: 2.2 g/dL (ref 1.9–3.7)
Glucose, Bld: 92 mg/dL (ref 65–99)
Potassium: 4.5 mmol/L (ref 3.5–5.3)
SODIUM: 142 mmol/L (ref 135–146)
Total Bilirubin: 0.7 mg/dL (ref 0.2–1.2)
Total Protein: 6.5 g/dL (ref 6.1–8.1)

## 2018-01-19 LAB — B12 AND FOLATE PANEL
Folate: 9.1 ng/mL
Vitamin B-12: 526 pg/mL (ref 200–1100)

## 2018-01-19 LAB — TSH: TSH: 2.04 mIU/L (ref 0.40–4.50)

## 2018-01-20 ENCOUNTER — Encounter: Payer: Self-pay | Admitting: Physician Assistant

## 2018-01-20 DIAGNOSIS — R5383 Other fatigue: Secondary | ICD-10-CM | POA: Insufficient documentation

## 2018-01-20 NOTE — Progress Notes (Signed)
Call pt: thyroid normal. b12 great. Kidney, liver, glucose look great. CBC looks great.

## 2018-03-12 ENCOUNTER — Encounter: Payer: Self-pay | Admitting: Family Medicine

## 2018-03-12 ENCOUNTER — Ambulatory Visit: Payer: BC Managed Care – PPO | Admitting: Family Medicine

## 2018-03-12 VITALS — BP 134/82 | HR 68 | Ht 68.0 in | Wt 149.0 lb

## 2018-03-12 DIAGNOSIS — R1031 Right lower quadrant pain: Secondary | ICD-10-CM

## 2018-03-12 MED ORDER — DICYCLOMINE HCL 10 MG PO CAPS: 10.0000 mg | ORAL_CAPSULE | Freq: Three times a day (TID) | ORAL | 0 refills | Status: DC

## 2018-03-12 NOTE — Patient Instructions (Addendum)
Thank you for coming in today. Continue healthy diet.  Avoid common IBS triggers like excessive caffeine, and possibly chocolate. Low gluten.   Trial of dicyclomine up to 4x daily with meals for gut spasms.  Many people take as needed.    If this is not helpful let me know.    Continue core strength.    Next work up is colonscopy.   Consider IBS  Irritable Bowel Syndrome, Adult Irritable bowel syndrome (IBS) is not one specific disease. It is a group of symptoms that affects the organs responsible for digestion (gastrointestinal or GI tract). To regulate how your GI tract works, your body sends signals back and forth between your intestines and your brain. If you have IBS, there may be a problem with these signals. As a result, your GI tract does not function normally. Your intestines may become more sensitive and overreact to certain things. This is especially true when you eat certain foods or when you are under stress. There are four types of IBS. These may be determined based on the consistency of your stool:  IBS with diarrhea.  IBS with constipation.  Mixed IBS.  Unsubtyped IBS.  It is important to know which type of IBS you have. Some treatments are more likely to be helpful for certain types of IBS. What are the causes? The exact cause of IBS is not known. What increases the risk? You may have a higher risk of IBS if:  You are a woman.  You are younger than 63 years old.  You have a family history of IBS.  You have mental health problems.  You have had bacterial infection of your GI tract.  What are the signs or symptoms? Symptoms of IBS vary from person to person. The main symptom is abdominal pain or discomfort. Additional symptoms usually include one or more of the following:  Diarrhea, constipation, or both.  Abdominal swelling or bloating.  Feeling full or sick after eating a small or regular-size meal.  Frequent gas.  Mucus in the stool.  A  feeling of having more stool left after a bowel movement.  Symptoms tend to come and go. They may be associated with stress, psychiatric conditions, or nothing at all. How is this diagnosed? There is no specific test to diagnose IBS. Your health care provider will make a diagnosis based on a physical exam, medical history, and your symptoms. You may have other tests to rule out other conditions that may be causing your symptoms. These may include:  Blood tests.  X-rays.  CT scan.  Endoscopy and colonoscopy. This is a test in which your GI tract is viewed with a long, thin, flexible tube.  How is this treated? There is no cure for IBS, but treatment can help relieve symptoms. IBS treatment often includes:  Changes to your diet, such as: ? Eating more fiber. ? Avoiding foods that cause symptoms. ? Drinking more water. ? Eating regular, medium-sized portioned meals.  Medicines. These may include: ? Fiber supplements if you have constipation. ? Medicine to control diarrhea (antidiarrheal medicines). ? Medicine to help control muscle spasms in your GI tract (antispasmodic medicines). ? Medicines to help with any mental health issues, such as antidepressants or tranquilizers.  Therapy. ? Talk therapy may help with anxiety, depression, or other mental health issues that can make IBS symptoms worse.  Stress reduction. ? Managing your stress can help keep symptoms under control.  Follow these instructions at home:  Take medicines only as  directed by your health care provider.  Eat a healthy diet. ? Avoid foods and drinks with added sugar. ? Include more whole grains, fruits, and vegetables gradually into your diet. This may be especially helpful if you have IBS with constipation. ? Avoid any foods and drinks that make your symptoms worse. These may include dairy products and caffeinated or carbonated drinks. ? Do not eat large meals. ? Drink enough fluid to keep your urine clear or  pale yellow.  Exercise regularly. Ask your health care provider for recommendations of good activities for you.  Keep all follow-up visits as directed by your health care provider. This is important. Contact a health care provider if:  You have constant pain.  You have trouble or pain with swallowing.  You have worsening diarrhea. Get help right away if:  You have severe and worsening abdominal pain.  You have diarrhea and: ? You have a rash, stiff neck, or severe headache. ? You are irritable, sleepy, or difficult to awaken. ? You are weak, dizzy, or extremely thirsty.  You have bright red blood in your stool or you have black tarry stools.  You have unusual abdominal swelling that is painful.  You vomit continuously.  You vomit blood (hematemesis).  You have both abdominal pain and a fever. This information is not intended to replace advice given to you by your health care provider. Make sure you discuss any questions you have with your health care provider. Document Released: 08/07/2005 Document Revised: 01/07/2016 Document Reviewed: 04/24/2014 Elsevier Interactive Patient Education  2018 [employer].

## 2018-03-12 NOTE — Progress Notes (Signed)
Lynn Lloyd is a 62 y.o. female who presents to [facility] today for right pelvis pain.  Lynn Lloyd has had ongoing intermittent right pelvis pain for months.  She is had a pretty extensive work-up with pelvis ultrasounds and a abdominal pelvic CT scan.  She is had trials of therapy with chiropractic care focus on core strength and hip girdle musculature.  She is had some improvement with the above interventions as well as a change in her diet focusing on eliminating gluten and soy.  Additionally she needs a higher fiber diet.  She notes overall she is had considerable improvement but continues to experience discomfort and intermittent cramping and pain in her bilateral pelvis but typically to her right lower quadrant of her abdomen and pelvis.  She denies any urinary symptoms or vaginal discharge.  She was prescribed Linzess by her PCP but has not started it.  She notes that she had a colonoscopy in 2015 that was normal (see scanned documents).  ROS as above:  Exam:  BP 134/82   Pulse 68   Ht 5\' 8"  (1.727 m)   Wt 149 lb (67.6 kg)   BMI 22.66 kg/m  Gen: Well NAD HEENT: EOMI,  MMM Lungs: Normal work of breathing. CTABL Heart: RRR no MRG Abd: NABS, Soft. Nondistended, minimally tender right lower quadrant abdomen.  Pain is improved with contraction of abdominal musculature and worsened with relaxation of abdominal wall musculature. Exts: Brisk capillary refill, warm and well perfused.  MSK: L-spine nontender normal back motion. Free motion of SI joints bilaterally without pain. Normal SI compression test. Normal hip motion and strength. Leg lengths equal bilaterally  Lab and Radiology Results EXAM: CT ABDOMEN AND PELVIS WITH CONTRAST  TECHNIQUE: Multidetector CT imaging of the abdomen and pelvis was performed using the standard protocol following bolus  administration of intravenous contrast.  CONTRAST:  128mL ISOVUE-300 IOPAMIDOL (ISOVUE-300) INJECTION 61%  COMPARISON:  Noncontrast exam on 10/03/2010  FINDINGS: Lower Chest: No acute findings.  Hepatobiliary: No hepatic masses identified. Prior cholecystectomy. No evidence of biliary obstruction.  Pancreas:  No mass or inflammatory changes.  Spleen: Within normal limits in size and appearance.  Adrenals/Urinary Tract: No masses identified. No evidence of hydronephrosis.  Stomach/Bowel: No evidence of obstruction, inflammatory process or abnormal fluid collections. Normal appendix visualized.  Vascular/Lymphatic: No pathologically enlarged lymph nodes. No abdominal aortic aneurysm.  Reproductive:  No mass or other significant abnormality.  Other:  None.  Musculoskeletal:  No suspicious bone lesions identified.  IMPRESSION: Negative. No evidence of appendicitis or other significant abnormality.   Electronically Signed   By: Earle Gell M.D.   On: 01/18/2018 12:57 I personally (independently) visualized and performed the interpretation of the images attached in this note.   EXAM: TRANSABDOMINAL AND TRANSVAGINAL ULTRASOUND OF PELVIS  DOPPLER ULTRASOUND OF OVARIES  TECHNIQUE: Both transabdominal and transvaginal ultrasound examinations of the pelvis were performed. Transabdominal technique was performed for global imaging of the pelvis including uterus, ovaries, adnexal regions, and pelvic cul-de-sac.  It was necessary to proceed with endovaginal exam following the transabdominal exam to visualize the endometrium and ovaries. Color and duplex Doppler ultrasound was utilized to evaluate blood flow to the ovaries.  COMPARISON:  None.  FINDINGS: Uterus  Measurements: 6.5 x 4.6 x 2.4 cm. No fibroids or other mass visualized.  Endometrium  Thickness: 1.8 mm which is within normal limits. No focal abnormality visualized.  Right  ovary  Measurements:  2.6 x 1.4 x 1.4 cm. Normal appearance/no adnexal mass.  Left ovary  Unable to visualize due to overlying bowel.  Pulsed Doppler evaluation of right ovary demonstrates normal low-resistance arterial and venous waveforms.  Other findings  No abnormal free fluid.  Possible left adnexal varices are noted.  IMPRESSION: Left ovary not visualized due to overlying bowel. Possible left pelvic varices are noted. No other abnormality seen in the pelvis.   Electronically Signed   By: Marijo Conception, M.D.   On: 01/04/2018 12:19   Assessment and Plan: 62 y.o. female with  Right lower abdominal or pelvis pain.  Pain to me seems very likely to be visceral.  I doubt MSK as an etiology.  I am suspicious for IBS or some other functional bowel disease.  Long discussion today about her work-up and pain and physical exam.  Discussed options.  Plan for trial of dicyclomine.  If no benefit I think a good next step would be further gastrology for diagnostic colonoscopy.  I think she very likely has IBS.  If no benefit from dicyclomine and normal colonoscopy Viberzi may be a good option.    Recheck if not improving.   No orders of the defined types were placed in this encounter.  Meds ordered this encounter  Medications  . dicyclomine (BENTYL) 10 MG capsule    Sig: Take 1 capsule (10 mg total) by mouth 4 (four) times daily -  before meals and at bedtime.    Dispense:  120 capsule    Refill:  0     Historical information moved to improve visibility of documentation.  Past Medical History:  Diagnosis Date  . Chronic kidney disease   . Constipation   . Endometriosis   . Hemorrhoid    Past Surgical History:  Procedure Laterality Date  . ADENOIDECTOMY    . BUNIONECTOMY  12/11  . CHOLECYSTECTOMY    . LAPAROSCOPY     endometriosis  . SALIVARY GLAND SURGERY     removal  . TONSILLECTOMY AND ADENOIDECTOMY     Social History   Tobacco Use  . Smoking status:  Never Smoker  . Smokeless tobacco: Never Used  Substance Use Topics  . Alcohol use: Yes    Alcohol/week: 1.2 oz    Types: 2 Standard drinks or equivalent per week    Comment: Occ   family history includes Allergic rhinitis in her mother; Asthma in her mother; COPD in her mother; Cancer in her mother; Hypertension in her mother; Parkinsonism in her father; Thyroid disease in her father and mother.  Medications: Current Outpatient Medications  Medication Sig Dispense Refill  . albuterol (PROVENTIL HFA;VENTOLIN HFA) 108 (90 Base) MCG/ACT inhaler Inhale 1-2 puffs into the lungs every 4 (four) hours as needed (bronchospasm). 1 Inhaler 0  . Cholecalciferol (VITAMIN D3) 1000 units CAPS Take 1,000 Units by mouth daily.    . Fluticasone Propionate (XHANCE) 93 MCG/ACT EXHU Place 2 sprays into the nose 2 (two) times daily. 16 mL 5  . ibuprofen (ADVIL,MOTRIN) 200 MG tablet Take 400 mg by mouth every 6 (six) hours as needed.    Marland Kitchen levocetirizine (XYZAL) 5 MG tablet Take 1 tablet (5 mg total) by mouth every evening. 30 tablet 5  . Magnesium 250 MG TABS Take by mouth.    . Turmeric 500 MG CAPS Take 500 mg by mouth.    . vitamin C (ASCORBIC ACID) 500 MG tablet Take 500 mg by mouth daily.    . Zinc 50  MG CAPS Take by mouth.    . dicyclomine (BENTYL) 10 MG capsule Take 1 capsule (10 mg total) by mouth 4 (four) times daily -  before meals and at bedtime. 120 capsule 0   No current facility-administered medications for this visit.    Allergies  Allergen Reactions  . Amoxicillin-Pot Clavulanate     "get really spaced out" pharmacist said was due to clavulanate  . Codeine Nausea Only  . Gluten Meal Other (See Comments)    Bloating  . Soy Allergy Other (See Comments)    Bloating  . Latex Other (See Comments)    whelps     Discussed warning signs or symptoms. Please see discharge instructions. Patient expresses understanding.

## 2018-04-03 ENCOUNTER — Other Ambulatory Visit: Payer: Self-pay | Admitting: Family Medicine

## 2018-04-20 ENCOUNTER — Other Ambulatory Visit: Payer: Self-pay | Admitting: Allergy

## 2018-05-07 ENCOUNTER — Other Ambulatory Visit: Payer: Self-pay | Admitting: Physician Assistant

## 2018-05-08 ENCOUNTER — Other Ambulatory Visit: Payer: Self-pay | Admitting: Physician Assistant

## 2018-05-08 NOTE — Telephone Encounter (Signed)
Per Pennelope Bracken at [facility], this RX was received.

## 2018-05-24 ENCOUNTER — Other Ambulatory Visit (HOSPITAL_COMMUNITY): Payer: Self-pay | Admitting: Obstetrics & Gynecology

## 2018-05-24 DIAGNOSIS — Z0000 Encounter for general adult medical examination without abnormal findings: Secondary | ICD-10-CM

## 2018-06-12 ENCOUNTER — Other Ambulatory Visit: Payer: Self-pay | Admitting: Physician Assistant

## 2018-06-12 MED ORDER — VILAZODONE HCL 20 MG PO TABS: 1.0000 | ORAL_TABLET | Freq: Every day | ORAL | 4 refills | Status: DC

## 2018-06-22 ENCOUNTER — Other Ambulatory Visit: Payer: Self-pay | Admitting: Allergy

## 2018-06-24 ENCOUNTER — Ambulatory Visit (INDEPENDENT_AMBULATORY_CARE_PROVIDER_SITE_OTHER): Payer: BC Managed Care – PPO | Admitting: Obstetrics & Gynecology

## 2018-06-24 ENCOUNTER — Encounter: Payer: Self-pay | Admitting: Obstetrics & Gynecology

## 2018-06-24 VITALS — BP 123/79 | HR 71 | Ht 68.0 in | Wt 150.0 lb

## 2018-06-24 DIAGNOSIS — Z01419 Encounter for gynecological examination (general) (routine) without abnormal findings: Secondary | ICD-10-CM | POA: Diagnosis not present

## 2018-06-24 DIAGNOSIS — R300 Dysuria: Secondary | ICD-10-CM

## 2018-06-24 LAB — POCT URINALYSIS DIPSTICK
BILIRUBIN UA: NEGATIVE
Blood, UA: NEGATIVE
GLUCOSE UA: NEGATIVE
Ketones, UA: NEGATIVE
Leukocytes, UA: NEGATIVE
Nitrite, UA: NEGATIVE
PH UA: 5 (ref 5.0–8.0)
Protein, UA: NEGATIVE
Spec Grav, UA: 1.015 (ref 1.010–1.025)
UROBILINOGEN UA: 0.2 U/dL

## 2018-06-24 MED ORDER — ESTRADIOL 0.1 MG/GM VA CREA: TOPICAL_CREAM | VAGINAL | 0 refills | Status: DC

## 2018-06-24 NOTE — Telephone Encounter (Signed)
Courtesy refill  

## 2018-06-24 NOTE — Progress Notes (Signed)
Subjective:     Lynn Lloyd is a 62 y.o. female here for a routine exam.  Current complaints: some hot flashes and bloating.  Seeing GI and recently found about about soy allergy. No vaginal bleeding.  Gynecologic History No LMP recorded. Patient is postmenopausal. Contraception: post menopausal status Last Pap: 2018. Results were: normal Last mammogram: 2018. Results were: normal  Obstetric History OB History  Gravida Para Term Preterm AB Living  3 3 3     4   SAB TAB Ectopic Multiple Live Births        1      # Outcome Date GA Lbr Len/2nd Weight Sex Delivery Anes PTL Lv  3 Term           2 Term           1 Term              The following portions of the patient's history were reviewed and updated as appropriate: allergies, current medications, past family history, past medical history, past social history, past surgical history and problem list.  Review of Systems Pertinent items noted in HPI and remainder of comprehensive ROS otherwise negative.    Objective:      Vitals:   06/24/18 0805  BP: 123/79  Pulse: 71  Weight: 150 lb (68 kg)  Height: 5\' 8"  (1.727 m)   Vitals:  WNL General appearance: alert, cooperative and no distress  HEENT: Normocephalic, without obvious abnormality, atraumatic Eyes: negative Throat: lips, mucosa, and tongue normal; teeth and gums normal  Respiratory: Clear to auscultation bilaterally  CV: Regular rate and rhythm  Breasts:  Normal appearance, no masses or tenderness, no nipple retraction or dimpling  GI: Soft, non-tender; bowel sounds normal; no masses,  no organomegaly  GU: External Genitalia:  Tanner V, no lesion Urethra:  No prolapse, asymptomatic caruncle  Vagina: Pink, normal rugae, no blood or discharge  Cervix: No CMT, no lesion  Uterus:  Normal size and contour, non tender  Adnexa: Normal, no masses, non tender  Musculoskeletal: No edema, redness or tenderness in the calves or thighs  Skin: No lesions or rash   Lymphatic: Axillary adenopathy: none     Psychiatric: Normal mood and behavior       Assessment:    Healthy female exam.   Bloating/ soy allefgy   Plan:  1.  Pap q 3 years with cotesting  2.  Yearly Mammogram 3.  Follow up with GI about bloating 4.  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  Recheck in 3 months

## 2018-06-24 NOTE — Progress Notes (Signed)
Last pap- 05/31/17- negative Mammogram scheduled for next week Pt declined flu shot

## 2018-06-25 MED ORDER — ESTRADIOL 0.1 MG/GM VA CREA: TOPICAL_CREAM | VAGINAL | 0 refills | Status: DC

## 2018-06-26 ENCOUNTER — Telehealth: Payer: Self-pay

## 2018-06-26 DIAGNOSIS — N362 Urethral caruncle: Secondary | ICD-10-CM

## 2018-06-26 LAB — URINE CULTURE

## 2018-06-26 MED ORDER — ESTRADIOL 0.1 MG/GM VA CREA: TOPICAL_CREAM | VAGINAL | 0 refills | Status: DC

## 2018-06-26 NOTE — Telephone Encounter (Signed)
Pt called stating the pharmacy has not received Rx for Estrace. I have resent Rx.

## 2018-07-03 ENCOUNTER — Ambulatory Visit: Payer: BC Managed Care – PPO

## 2018-07-12 ENCOUNTER — Other Ambulatory Visit: Payer: Self-pay | Admitting: Allergy

## 2018-07-24 ENCOUNTER — Ambulatory Visit (INDEPENDENT_AMBULATORY_CARE_PROVIDER_SITE_OTHER): Payer: BC Managed Care – PPO

## 2018-07-24 DIAGNOSIS — Z0000 Encounter for general adult medical examination without abnormal findings: Secondary | ICD-10-CM

## 2018-07-24 DIAGNOSIS — Z1231 Encounter for screening mammogram for malignant neoplasm of breast: Secondary | ICD-10-CM | POA: Diagnosis not present

## 2018-09-02 ENCOUNTER — Ambulatory Visit: Payer: BC Managed Care – PPO | Admitting: Obstetrics & Gynecology

## 2018-09-02 ENCOUNTER — Encounter: Payer: Self-pay | Admitting: Obstetrics & Gynecology

## 2018-09-02 VITALS — BP 97/67 | HR 79 | Resp 16 | Ht 68.0 in | Wt 148.0 lb

## 2018-09-02 DIAGNOSIS — N362 Urethral caruncle: Secondary | ICD-10-CM | POA: Diagnosis not present

## 2018-09-02 LAB — POCT URINALYSIS DIPSTICK
Appearance: NORMAL
BILIRUBIN UA: NEGATIVE
Blood, UA: NEGATIVE
Glucose, UA: NEGATIVE
KETONES UA: NEGATIVE
Leukocytes, UA: NEGATIVE
NITRITE UA: NEGATIVE
PROTEIN UA: NEGATIVE
SPEC GRAV UA: 1.025 (ref 1.010–1.025)
UROBILINOGEN UA: NEGATIVE U/dL — AB
pH, UA: 7 (ref 5.0–8.0)

## 2018-09-02 NOTE — Progress Notes (Signed)
   Subjective:    Patient ID: Lynn Lloyd, female    DOB: 1956-01-08, 63 y.o.   MRN: [id number]  HPI 63 yo married P4 here for continued discomfort in her vagina when she is sitting/bike riding. She was seen by Dr. 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 She started vaginal estrogen therapy. This has not relieved her symptoms.   Review of Systems     Objective:   Physical Exam Breathing, conversing, and ambulating normally Well nourished, well hydrated White female, no apparent distress Small urethral caruncle noted Her symptoms sound very suspicous for prolapse, but I could only see very mild cystocele/rectocele, even with Valsalva.    Assessment & Plan:  Vaginal discomfort Urethral caruncle I will send her for a urology consult

## 2018-09-02 NOTE — Addendum Note (Signed)
Addended by: Asencion Islam on: 09/02/2018 08:13 AM   Modules accepted: Orders

## 2018-10-17 ENCOUNTER — Encounter

## 2019-02-04 ENCOUNTER — Encounter: Payer: Self-pay | Admitting: *Deleted

## 2019-07-07 ENCOUNTER — Encounter: Payer: Self-pay | Admitting: Obstetrics & Gynecology

## 2019-07-07 ENCOUNTER — Other Ambulatory Visit: Payer: Self-pay

## 2019-07-07 ENCOUNTER — Other Ambulatory Visit: Payer: Self-pay | Admitting: *Deleted

## 2019-07-07 ENCOUNTER — Ambulatory Visit: Payer: BC Managed Care – PPO | Admitting: Obstetrics and Gynecology

## 2019-07-07 DIAGNOSIS — Z13220 Encounter for screening for lipoid disorders: Secondary | ICD-10-CM

## 2019-07-07 DIAGNOSIS — Z0000 Encounter for general adult medical examination without abnormal findings: Secondary | ICD-10-CM

## 2019-07-08 LAB — CBC
HCT: 43.5 % (ref 35.0–45.0)
Hemoglobin: 14.6 g/dL (ref 11.7–15.5)
MCH: 31.1 pg (ref 27.0–33.0)
MCHC: 33.6 g/dL (ref 32.0–36.0)
MCV: 92.8 fL (ref 80.0–100.0)
MPV: 11.8 fL (ref 7.5–12.5)
Platelets: 253 10*3/uL (ref 140–400)
RBC: 4.69 10*6/uL (ref 3.80–5.10)
RDW: 12.7 % (ref 11.0–15.0)
WBC: 7.4 10*3/uL (ref 3.8–10.8)

## 2019-07-08 LAB — TSH: TSH: 3.24 mIU/L (ref 0.40–4.50)

## 2019-07-08 LAB — LIPID PANEL
Cholesterol: 213 mg/dL — ABNORMAL HIGH (ref <200)
HDL: 86 mg/dL (ref >=50)
LDL Cholesterol (Calc): 113 mg/dL (calc) — ABNORMAL HIGH
Non-HDL Cholesterol (Calc): 127 mg/dL (calc) (ref <130)
Total CHOL/HDL Ratio: 2.5 (calc) (ref <5.0)
Triglycerides: 53 mg/dL (ref <150)

## 2019-07-08 LAB — VITAMIN D 25 HYDROXY (VIT D DEFICIENCY, FRACTURES): Vit D, 25-Hydroxy: 29 ng/mL — ABNORMAL LOW (ref 30–100)

## 2019-07-09 LAB — COMPREHENSIVE METABOLIC PANEL
AG Ratio: 1.7 (calc) (ref 1.0–2.5)
ALT: 13 U/L (ref 6–29)
AST: 16 U/L (ref 10–35)
Albumin: 4.5 g/dL (ref 3.6–5.1)
Alkaline phosphatase (APISO): 67 U/L (ref 37–153)
BUN: 17 mg/dL (ref 7–25)
CO2: 20 mmol/L (ref 20–32)
Calcium: 9.6 mg/dL (ref 8.6–10.4)
Chloride: 107 mmol/L (ref 98–110)
Creat: 0.92 mg/dL (ref 0.50–0.99)
Globulin: 2.6 g/dL (calc) (ref 1.9–3.7)
Glucose, Bld: 98 mg/dL (ref 65–99)
Potassium: 4.4 mmol/L (ref 3.5–5.3)
Sodium: 145 mmol/L (ref 135–146)
Total Bilirubin: 0.6 mg/dL (ref 0.2–1.2)
Total Protein: 7.1 g/dL (ref 6.1–8.1)

## 2019-07-09 NOTE — Progress Notes (Signed)
Patient canceled her appointment today.   Noni Saupe I, NP 07/09/2019 1:01 PM

## 2019-07-10 ENCOUNTER — Ambulatory Visit: Payer: BC Managed Care – PPO | Admitting: Obstetrics & Gynecology

## 2019-07-10 ENCOUNTER — Encounter: Payer: Self-pay | Admitting: Obstetrics & Gynecology

## 2019-07-28 ENCOUNTER — Ambulatory Visit: Payer: BC Managed Care – PPO | Admitting: Obstetrics & Gynecology

## 2019-07-31 ENCOUNTER — Other Ambulatory Visit: Payer: Self-pay

## 2019-07-31 ENCOUNTER — Ambulatory Visit (INDEPENDENT_AMBULATORY_CARE_PROVIDER_SITE_OTHER): Payer: BC Managed Care – PPO

## 2019-07-31 ENCOUNTER — Ambulatory Visit (INDEPENDENT_AMBULATORY_CARE_PROVIDER_SITE_OTHER): Payer: BC Managed Care – PPO | Admitting: Obstetrics & Gynecology

## 2019-07-31 ENCOUNTER — Encounter: Payer: Self-pay | Admitting: Obstetrics & Gynecology

## 2019-07-31 VITALS — BP 111/79 | HR 65 | Resp 16 | Ht 68.0 in | Wt 148.0 lb

## 2019-07-31 DIAGNOSIS — Z01419 Encounter for gynecological examination (general) (routine) without abnormal findings: Secondary | ICD-10-CM

## 2019-07-31 DIAGNOSIS — Z124 Encounter for screening for malignant neoplasm of cervix: Secondary | ICD-10-CM

## 2019-07-31 DIAGNOSIS — Z1151 Encounter for screening for human papillomavirus (HPV): Secondary | ICD-10-CM | POA: Diagnosis not present

## 2019-07-31 DIAGNOSIS — Z0000 Encounter for general adult medical examination without abnormal findings: Secondary | ICD-10-CM

## 2019-07-31 MED ORDER — VITAMIN D (ERGOCALCIFEROL) 1.25 MG (50000 UNIT) PO CAPS: 50000.0000 [IU] | ORAL_CAPSULE | ORAL | 0 refills | Status: DC

## 2019-07-31 NOTE — Progress Notes (Signed)
Subjective:    Lynn Lloyd is a 63 y.o. married P4who presents for an annual exam. The patient has no complaints today. She changed her diet and her pelvic pain completely resolved and she lost 5 pounds.  The patient is sexually active. GYN screening history: last pap: was normal. The patient wears seatbelts: yes. The patient participates in regular exercise: no. Has the patient ever been transfused or tattooed?: no. The patient reports that there is not domestic violence in her life.   Menstrual History: OB History    Gravida  3   Para  3   Term  3   Preterm      AB      Living  4     SAB      TAB      Ectopic      Multiple  1   Live Births              Menarche age: 38 No LMP recorded. Patient is postmenopausal.    The following portions of the patient's history were reviewed and updated as appropriate: allergies, current medications, past family history, past medical history, past social history, past surgical history and problem list.  Review of Systems Pertinent items are noted in HPI.   Married for 33 years Works at [employer] done today Colon screening done at 63 yo Doesn't want flu vaccine   Objective:    BP 111/79   Pulse 65   Resp 16   Ht 5\' 8"  (1.727 m)   Wt 148 lb (67.1 kg)   BMI 22.50 kg/m   General Appearance:    Alert, cooperative, no distress, appears stated age  Head:    Normocephalic, without obvious abnormality, atraumatic  Eyes:    PERRL, conjunctiva/corneas clear, EOM's intact, fundi    benign, both eyes  Ears:    Normal TM's and external ear canals, both ears  Nose:   Nares normal, septum midline, mucosa normal, no drainage    or sinus tenderness  Throat:   Lips, mucosa, and tongue normal; teeth and gums normal  Neck:   Supple, symmetrical, trachea midline, no adenopathy;    thyroid:  no enlargement/tenderness/nodules; no carotid   bruit or JVD  Back:     Symmetric, no curvature, ROM normal, no CVA tenderness   Lungs:     Clear to auscultation bilaterally, respirations unlabored  Chest Wall:    No tenderness or deformity   Heart:    Regular rate and rhythm, S1 and S2 normal, no murmur, rub   or gallop  Breast Exam:    No tenderness, masses, or nipple abnormality  Abdomen:     Soft, non-tender, bowel sounds active all four quadrants,    no masses, no organomegaly  Genitalia:    Normal female without lesion, discharge or tenderness, normal size and shape, anteverted, mobile, non-tender, normal adnexal exam      Extremities:   Extremities normal, atraumatic, no cyanosis or edema  Pulses:   2+ and symmetric all extremities  Skin:   Skin color, texture, turgor normal, no rashes or lesions  Lymph nodes:   Cervical, supraclavicular, and axillary nodes normal  Neurologic:   CNII-XII intact, normal strength, sensation and reflexes    throughout  .    Assessment:    Healthy female exam.    Plan:     Thin prep Pap smear. with cotesting

## 2019-08-06 LAB — CYTOLOGY - PAP
Comment: NEGATIVE
Diagnosis: NEGATIVE
High risk HPV: NEGATIVE

## 2019-09-04 ENCOUNTER — Other Ambulatory Visit: Payer: Self-pay

## 2019-09-04 ENCOUNTER — Ambulatory Visit (INDEPENDENT_AMBULATORY_CARE_PROVIDER_SITE_OTHER): Payer: BC Managed Care – PPO | Admitting: Physician Assistant

## 2019-09-04 VITALS — BP 115/74 | HR 64 | Temp 98.4°F | Ht 68.0 in | Wt 146.0 lb

## 2019-09-04 DIAGNOSIS — R102 Pelvic and perineal pain: Secondary | ICD-10-CM | POA: Diagnosis not present

## 2019-09-04 DIAGNOSIS — R350 Frequency of micturition: Secondary | ICD-10-CM | POA: Diagnosis not present

## 2019-09-04 LAB — WET PREP FOR TRICH, YEAST, CLUE
MICRO NUMBER:: 10042348
Specimen Quality: ADEQUATE

## 2019-09-04 LAB — POCT URINALYSIS DIP (CLINITEK)
Bilirubin, UA: NEGATIVE
Blood, UA: NEGATIVE
Glucose, UA: NEGATIVE mg/dL
Ketones, POC UA: NEGATIVE mg/dL
Leukocytes, UA: NEGATIVE
Nitrite, UA: NEGATIVE
POC PROTEIN,UA: NEGATIVE
Spec Grav, UA: 1.015 (ref 1.010–1.025)
Urobilinogen, UA: 0.2 E.U./dL
pH, UA: 6 (ref 5.0–8.0)

## 2019-09-04 NOTE — Progress Notes (Signed)
No trich, yeast, or clue cells. Completely normal.

## 2019-09-04 NOTE — Patient Instructions (Signed)
Start using estrogen cream again.  Will call with wet prep and urine culture.  Go back and get cystoscopy with urology.

## 2019-09-04 NOTE — Progress Notes (Signed)
Subjective:    Patient ID: Lynn Lloyd, female    DOB: 07-05-1956, 64 y.o.   MRN: [id number]  HPI  Pt is a 64 yo female who presents to the clinic with urinary frequency and pelvic pressure who presents to the clinic for evaluation. She had this issue back in the summer but resolved with pelvic PT. She has had new symptoms for the last month.  She has had normal pelvic u/s in the past 6 months. She denies any dysuria but has a lot of frequency and pressure very low and urinary. No vaginal discharge or itching. She admits to stopping estrogen cream. No fever, chills, n/v/d. She continues to do her pelvic floor exercises.   .. Active Ambulatory Problems    Diagnosis Date Noted  . WARTS, MULTIPLE 09/09/2007  . NEVUS, ATYPICAL 01/09/2008  . NEOPLASM, SKIN, UNCERTAIN BEHAVIOR [id number]  . TINNITUS 07/13/2009  . EAR PAIN, RIGHT 07/13/2009  . POSTMENOPAUSAL STATUS 12/06/2007  . POSTMENOPAUSAL SYNDROME 12/15/2009  . SOLAR KERATOSIS 03/31/2009  . SEBORRHEIC KERATOSIS 08/12/2008  . PAIN IN JOINT, MULTIPLE SITES 09/28/2009  . SPINAL STENOSIS, CERVICAL 09/09/2007  . FINGER PAIN 10/22/2009  . Other malaise and fatigue 12/06/2007  . Abdominal pain, other specified site 10/03/2010  . Post concussion syndrome 11/07/2013  . Right knee pain 05/06/2015  . Right shoulder pain 03/02/2016  . Encounter for adjustment or management of vascular access device 09/13/2016  . Bronchospasm 11/15/2017  . Allergic rhinitis 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 11/15/2017  . Environmental and seasonal allergies 11/15/2017  . Chronic idiopathic constipation 01/18/2018  . RLQ abdominal pain 01/18/2018  . No energy 01/20/2018   Resolved Ambulatory Problems    Diagnosis Date Noted  . SINUSITIS- ACUTE-NOS 11/12/2007  . ALLERGIC RHINITIS 11/12/2007  . Sialolithiasis 07/24/2008  . Acute cystitis 01/16/2008  . CERVICAL RADICULOPATHY 11/07/2006  . ABDOMINAL BLOATING 12/15/2009  . Dysuria 03/21/2010  . FREQUENCY, URINARY 04/20/2010   . Nausea alone 10/03/2010  . Recurrent maxillary sinusitis 09/22/2016   Past Medical History:  Diagnosis Date  . Chronic kidney disease   . Constipation   . Endometriosis   . Hemorrhoid        Review of Systems See HPI.     Objective:   Physical Exam Vitals reviewed.  Constitutional:      Appearance: Normal appearance.  Cardiovascular:     Rate and Rhythm: Normal rate and regular rhythm.  Pulmonary:     Effort: Pulmonary effort is normal.  Abdominal:     General: Bowel sounds are normal. There is no distension.     Palpations: Abdomen is soft.     Tenderness: There is no abdominal tenderness. There is guarding. There is no right CVA tenderness, left CVA tenderness or rebound.  Genitourinary:    General: Normal vulva.     Vagina: No vaginal discharge.     Rectum: Normal.     Comments: No adnexal tenderness. No cystocele.  Neurological:     General: No focal deficit present.     Mental Status: She is alert.  Psychiatric:        Mood and Affect: Mood normal.           Assessment & Plan:  Marland KitchenMarland KitchenRickel was seen today for urinary frequency.  Diagnoses and all orders for this visit:  Urinary frequency -     POCT URINALYSIS DIP (CLINITEK) -     Urine Culture  Pelvic pressure in female -     POCT URINALYSIS DIP (CLINITEK) -  Urine Culture -     WET PREP FOR TRICH, YEAST, CLUE  .Marland Kitchen Results for orders placed or performed in visit on 09/04/19  Urine Culture   Specimen: Urine  Result Value Ref Range   MICRO NUMBER: [id number]    SPECIMEN QUALITY: Adequate    Sample Source NOT GIVEN    STATUS: FINAL    Result: No Growth   WET PREP FOR TRICH, YEAST, CLUE  Result Value Ref Range   MICRO NUMBER: [id number]    Specimen Quality Adequate    SOURCE: NOT GIVEN    Status FINAL    RESULT      No Trichomonas vaginalis seen. No yeast seen No clue cells seen Epithelial Cells Present  POCT URINALYSIS DIP (CLINITEK)  Result Value Ref Range   Color, UA yellow yellow    Clarity, UA clear clear   Glucose, UA negative negative mg/dL   Bilirubin, UA negative negative   Ketones, POC UA negative negative mg/dL   Spec Grav, UA 1.015 1.010 - 1.025   Blood, UA negative negative   pH, UA 6.0 5.0 - 8.0   POC PROTEIN,UA negative negative, trace   Urobilinogen, UA 0.2 0.2 or 1.0 E.U./dL   Nitrite, UA Negative Negative   Leukocytes, UA Negative Negative   UA and culture negative. Wet prep negative. Normal gyn exam. Needs follow up with urology. Last urology note suggested cytoscopy. Restart estrogen cream. Continue with pelvic u/s

## 2019-09-05 LAB — URINE CULTURE
MICRO NUMBER:: 10043388
Result:: NO GROWTH
SPECIMEN QUALITY:: ADEQUATE

## 2019-09-07 ENCOUNTER — Encounter: Payer: Self-pay | Admitting: Physician Assistant

## 2019-09-07 ENCOUNTER — Other Ambulatory Visit: Payer: Self-pay | Admitting: Physician Assistant

## 2019-09-07 NOTE — Progress Notes (Signed)
Lynn Lloyd,   Urine culture returned and no bacteria growth on culture.

## 2019-09-19 ENCOUNTER — Other Ambulatory Visit: Payer: Self-pay | Admitting: Obstetrics & Gynecology

## 2020-01-15 ENCOUNTER — Ambulatory Visit: Payer: BC Managed Care – PPO | Admitting: Family Medicine

## 2020-01-15 ENCOUNTER — Encounter: Payer: Self-pay | Admitting: Family Medicine

## 2020-01-15 VITALS — BP 123/66 | HR 65 | Ht 68.0 in | Wt 148.0 lb

## 2020-01-15 DIAGNOSIS — L820 Inflamed seborrheic keratosis: Secondary | ICD-10-CM | POA: Diagnosis not present

## 2020-01-15 DIAGNOSIS — Z13820 Encounter for screening for osteoporosis: Secondary | ICD-10-CM

## 2020-01-15 MED ORDER — DOXYCYCLINE HYCLATE 100 MG PO TABS: 100.0000 mg | ORAL_TABLET | Freq: Two times a day (BID) | ORAL | 0 refills | Status: DC

## 2020-01-15 NOTE — Progress Notes (Addendum)
Established Patient Office Visit  Subjective:  Patient ID: Lynn Lloyd, female    DOB: [birth year]  Age: 64 y.o. MRN: [id number]  CC:  Chief Complaint  Patient presents with  . Skin Problem    HPI Lynn Lloyd presents for several skin lesions that she would like treated.  She has one under her left breast that has been bleeding.  And one on the top left shoulder that has been persistently itching.  And she was worried that since it was itchy that it might be more worrisome.  Has a lesion on her left buttock cheek that she noticed after she has been sitting at the hospital for days with her husband who just had surgery.  She says it feels a little bit tender.  She does notice that there is a red ring around it and wanted me to take a look at it today.  Past Medical History:  Diagnosis Date  . Chronic kidney disease   . Constipation   . Endometriosis   . Hemorrhoid     Past Surgical History:  Procedure Laterality Date  . ADENOIDECTOMY    . BUNIONECTOMY  12/11  . CHOLECYSTECTOMY    . LAPAROSCOPY     endometriosis  . SALIVARY GLAND SURGERY     removal  . TONSILLECTOMY AND ADENOIDECTOMY      Family History  Problem Relation Age of Onset  . Thyroid disease Father   . Parkinsonism Father   . Cancer Mother        acl  . Hypertension Mother   . Thyroid disease Mother   . Asthma Mother   . COPD Mother   . Allergic rhinitis Mother     Social History   Socioeconomic History  . Marital status: Married    Spouse name: Not on file  . Number of children: Not on file  . Years of education: Not on file  . Highest education level: Not on file  Occupational History  . Occupation: Glass blower/designer    Employer: [employer]  Tobacco Use  . Smoking status: Never Smoker  . Smokeless tobacco: Never Used  Substance and Sexual Activity  . Alcohol use: Yes    Alcohol/week: 2.0 standard drinks    Types: 2 Standard drinks or equivalent per week    Comment: Occ  . Drug  use: No  . Sexual activity: Yes    Partners: Male    Birth control/protection: Post-menopausal  Other Topics Concern  . Not on file  Social History Narrative  . Not on file   Social Determinants of Health   Financial Resource Strain:   . Difficulty of Paying Living Expenses:   Food Insecurity:   . Worried About Charity fundraiser in the Last Year:   . Arboriculturist in the Last Year:   Transportation Needs:   . Film/video editor (Medical):   Marland Kitchen Lack of Transportation (Non-Medical):   Physical Activity:   . Days of Exercise per Week:   . Minutes of Exercise per Session:   Stress:   . Feeling of Stress :   Social Connections:   . Frequency of Communication with Friends and Family:   . Frequency of Social Gatherings with Friends and Family:   . Attends Religious Services:   . Active Member of Clubs or Organizations:   . Attends Archivist Meetings:   Marland Kitchen Marital Status:   Intimate Partner Violence:   . Fear of Current or  Ex-Partner:   . Emotionally Abused:   Marland Kitchen Physically Abused:   . Sexually Abused:     Outpatient Medications Prior to Visit  Medication Sig Dispense Refill  . Biotin 1000 MCG tablet     . Magnesium 250 MG TABS Take by mouth.    . Turmeric 500 MG CAPS Take 500 mg by mouth.    . vitamin C (ASCORBIC ACID) 500 MG tablet Take 500 mg by mouth daily.    . Zinc 50 MG CAPS Take by mouth.    . Vitamin D, Ergocalciferol, (DRISDOL) 1.25 MG (50000 UNIT) CAPS capsule TAKE 1 CAPSULE (50,000 UNITS TOTAL) BY MOUTH EVERY 7 (SEVEN) DAYS. 8 capsule 0   No facility-administered medications prior to visit.    Allergies  Allergen Reactions  . Amoxicillin-Pot Clavulanate     "get really spaced out" pharmacist said was due to clavulanate  . Cauliflower [Brassica Oleracea]   . Codeine Nausea Only  . Corn-Containing Products Other (See Comments)  . Cow's Milk  [Lac Bovis] Cough and Other (See Comments)  . Gluten Meal Other (See Comments)    Bloating  . Soy  Allergy Other (See Comments)    Bloating  . Latex Other (See Comments)    whelps    ROS Review of Systems    Objective:    Physical Exam  Skin:  He has several inflamed seborrheic keratoses around her bra area and the one underneath her left breast actually does have a little bit of fresh blood on it.  On the top left back she does have an erythematous papule that looks more consistent with an actinic keratosis versus an inflamed seborrheic keratosis.  Recommend cryotherapy for more definitive treatment.  Follow-up wound care discussed.  She also has an erythematous area on the left buttock cheek that is very circular approximately 6 cm in size.  A slightly small dry scabbed area in the center but no active drainage or wound no pustule or abscess.    BP 123/66   Pulse 65   Ht 5\' 8"  (1.727 m)   Wt 148 lb (67.1 kg)   SpO2 100%   BMI 22.50 kg/m  Wt Readings from Last 3 Encounters:  01/15/20 148 lb (67.1 kg)  09/04/19 146 lb (66.2 kg)  07/31/19 148 lb (67.1 kg)     Health Maintenance Due  Topic Date Due  . Hepatitis C Screening  Never done  . DEXA SCAN  12/20/2019  . PNA vac Low Risk Adult (1 of 2 - PCV13) 12/20/2019    There are no preventive care reminders to display for this patient.  Lab Results  Component Value Date   TSH 3.24 07/07/2019   Lab Results  Component Value Date   WBC 7.4 07/07/2019   HGB 14.6 07/07/2019   HCT 43.5 07/07/2019   MCV 92.8 07/07/2019   PLT 253 07/07/2019   Lab Results  Component Value Date   NA 145 07/07/2019   K 4.4 07/07/2019   CO2 20 07/07/2019   GLUCOSE 98 07/07/2019   BUN 17 07/07/2019   CREATININE 0.92 07/07/2019   BILITOT 0.6 07/07/2019   ALKPHOS 64 09/11/2016   AST 16 07/07/2019   ALT 13 07/07/2019   PROT 7.1 07/07/2019   ALBUMIN 4.1 09/11/2016   CALCIUM 9.6 07/07/2019   Lab Results  Component Value Date   CHOL 213 (H) 07/07/2019   Lab Results  Component Value Date   HDL 86 07/07/2019   Lab Results   Component  Value Date   LDLCALC 113 (H) 07/07/2019   Lab Results  Component Value Date   TRIG 53 07/07/2019   Lab Results  Component Value Date   CHOLHDL 2.5 07/07/2019   No results found for: HGBA1C    Assessment & Plan:   Problem List Items Addressed This Visit    None    Visit Diagnoses    Osteoporosis screening    -  Primary   Relevant Orders   DG Bone Density   Seborrheic keratoses, inflamed          Recommend treatment option for the inflamed seborrheic keratoses with cryotherapy if lesions recur then recommend shave biopsy for more definitive evaluation.  Patient tolerated cryotherapy well.  Follow-up wound care discussed.  Due for bone density screening.  Order placed today.  Also has an erythematous lesion on the posterior buttocks area that is red and irritated.  Given a prescription for doxycycline to use over the long holiday weekend if she feels like it is getting worse, not resolving or suddenly feels a lump underneath the skin that might indicate an abscess or infection.  Meds ordered this encounter  Medications  . doxycycline (VIBRA-TABS) 100 MG tablet    Sig: Take 1 tablet (100 mg total) by mouth 2 (two) times daily.    Dispense:  20 tablet    Refill:  0    Cryotherapy Procedure Note  Pre-operative Diagnosis: seb keratoese, inflammed.    Post-operative Diagnosis: same  Locations: chest below breast area and mid back  Indications: irritation and bleeding.    Anesthesia: not required    Procedure Details  Patient informed of risks (permanent scarring, infection, light or dark discoloration, bleeding, infection, weakness, numbness and recurrence of the lesion) and benefits of the procedure and verbal informed consent obtained.  The areas are treated with liquid nitrogen therapy, frozen until ice ball extended 1-2 mm beyond lesion, allowed to thaw, and treated again. The patient tolerated procedure well.  The patient was instructed on post-op  care, warned that there may be blister formation, redness and pain. Recommend OTC analgesia as needed for pain.  Condition: Stable  Complications: none.  Plan: 1. Instructed to keep the area dry and covered for 24-48h and clean thereafter. 2. Warning signs of infection were reviewed.   3. Recommended that the patient use OTC acetaminophen as needed for pain.  4. Return PRN.     Follow-up: Return if symptoms worsen or fail to improve.    Beatrice Lecher, MD

## 2020-02-04 ENCOUNTER — Ambulatory Visit (INDEPENDENT_AMBULATORY_CARE_PROVIDER_SITE_OTHER): Payer: BC Managed Care – PPO

## 2020-02-04 ENCOUNTER — Other Ambulatory Visit: Payer: Self-pay

## 2020-02-04 DIAGNOSIS — Z13820 Encounter for screening for osteoporosis: Secondary | ICD-10-CM | POA: Diagnosis not present

## 2020-06-07 ENCOUNTER — Encounter: Payer: Self-pay | Admitting: Family Medicine

## 2020-06-07 ENCOUNTER — Ambulatory Visit (INDEPENDENT_AMBULATORY_CARE_PROVIDER_SITE_OTHER): Payer: BC Managed Care – PPO | Admitting: Family Medicine

## 2020-06-07 ENCOUNTER — Other Ambulatory Visit: Payer: Self-pay

## 2020-06-07 VITALS — BP 120/76 | HR 71 | Ht 68.0 in | Wt 150.0 lb

## 2020-06-07 DIAGNOSIS — R140 Abdominal distension (gaseous): Secondary | ICD-10-CM

## 2020-06-07 DIAGNOSIS — R1084 Generalized abdominal pain: Secondary | ICD-10-CM | POA: Diagnosis not present

## 2020-06-07 DIAGNOSIS — R300 Dysuria: Secondary | ICD-10-CM

## 2020-06-07 DIAGNOSIS — N760 Acute vaginitis: Secondary | ICD-10-CM

## 2020-06-07 DIAGNOSIS — R6881 Early satiety: Secondary | ICD-10-CM

## 2020-06-07 LAB — POCT URINALYSIS DIP (CLINITEK)
Bilirubin, UA: NEGATIVE
Blood, UA: NEGATIVE
Glucose, UA: NEGATIVE mg/dL
Ketones, POC UA: NEGATIVE mg/dL
Nitrite, UA: NEGATIVE
POC PROTEIN,UA: NEGATIVE
Spec Grav, UA: 1.01 (ref 1.010–1.025)
Urobilinogen, UA: 0.2 E.U./dL
pH, UA: 7.5 (ref 5.0–8.0)

## 2020-06-07 MED ORDER — SULFAMETHOXAZOLE-TRIMETHOPRIM 800-160 MG PO TABS: 1.0000 | ORAL_TABLET | Freq: Two times a day (BID) | ORAL | 0 refills | Status: DC

## 2020-06-07 NOTE — Progress Notes (Signed)
Acute Office Visit  Subjective:    Patient ID: Lynn Lloyd, female    DOB: Mar 27, 1956, 64 y.o.   MRN: [id number]  Chief Complaint  Patient presents with  . Urinary Tract Infection    HPI Patient is in today for dysuria x 1 week. + odor and vaginal d/c.  Did take some AZO.  No fever, sweats or chills.  + nausea.    She also reports that she has been having a lot of abdominal bloating.  She says has been going on for quite some time probably years really.  It just seems to be every time she eats she will have a few bites she will feel full and then she will feel bloated.  She actually did have some food testing done over a year ago and knows what sensitivity she has she was working with a nutritionist at the time.  She is concerned because she had a maternal aunt who was diagnosed with ovarian cancer in her early to mid 22s.  No family history of colon cancer or breast cancer.  There her aunt was just found to have some lesions on the bowel.  So unclear if this is a new onset colon cancer or recurrence of her ovarian.  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  Past Medical History:  Diagnosis Date  . Chronic kidney disease   . Constipation   . Endometriosis   . Hemorrhoid     Past Surgical History:  Procedure Laterality Date  . ADENOIDECTOMY    . BUNIONECTOMY  12/11  . CHOLECYSTECTOMY    . LAPAROSCOPY     endometriosis  . SALIVARY GLAND SURGERY     removal  . TONSILLECTOMY AND ADENOIDECTOMY      Family History  Problem Relation Age of Onset  . Thyroid disease Father   . Parkinsonism Father   . Cancer Mother        acl  . Hypertension Mother   . Thyroid disease Mother   . Asthma Mother   . COPD Mother   . Allergic rhinitis Mother     Social History   Socioeconomic History  . Marital status: Married    Spouse name: Not on file  . Number of children: Not on file  . Years of education: Not on file  . Highest education  level: Not on file  Occupational History  . Occupation: Glass blower/designer    Employer: [employer]  Tobacco Use  . Smoking status: Never Smoker  . Smokeless tobacco: Never Used  Vaping Use  . Vaping Use: Never used  Substance and Sexual Activity  . Alcohol use: Yes    Alcohol/week: 2.0 standard drinks    Types: 2 Standard drinks or equivalent per week    Comment: Occ  . Drug use: No  . Sexual activity: Yes    Partners: Male    Birth control/protection: Post-menopausal  Other Topics Concern  . Not on file  Social History Narrative  . Not on file   Social Determinants of Health   Financial Resource Strain:   . Difficulty of Paying Living Expenses: Not on file  Food Insecurity:   . Worried About Charity fundraiser in the Last Year: Not on file  . Ran Out of Food in the Last Year: Not on file  Transportation Needs:   . Lack of Transportation (Medical): Not on file  . Lack of Transportation (Non-Medical): Not on  file  Physical Activity:   . Days of Exercise per Week: Not on file  . Minutes of Exercise per Session: Not on file  Stress:   . Feeling of Stress : Not on file  Social Connections:   . Frequency of Communication with Friends and Family: Not on file  . Frequency of Social Gatherings with Friends and Family: Not on file  . Attends Religious Services: Not on file  . Active Member of Clubs or Organizations: Not on file  . Attends Archivist Meetings: Not on file  . Marital Status: Not on file  Intimate Partner Violence:   . Fear of Current or Ex-Partner: Not on file  . Emotionally Abused: Not on file  . Physically Abused: Not on file  . Sexually Abused: Not on file    Outpatient Medications Prior to Visit  Medication Sig Dispense Refill  . Biotin 1000 MCG tablet     . doxycycline (VIBRA-TABS) 100 MG tablet Take 1 tablet (100 mg total) by mouth 2 (two) times daily. 20 tablet 0  . Magnesium 250 MG TABS Take by mouth.    . Turmeric 500 MG CAPS Take 500  mg by mouth.    . vitamin C (ASCORBIC ACID) 500 MG tablet Take 500 mg by mouth daily.    . Zinc 50 MG CAPS Take by mouth.     No facility-administered medications prior to visit.    Allergies  Allergen Reactions  . Amoxicillin-Pot Clavulanate     "get really spaced out" pharmacist said was due to clavulanate  . Cauliflower [Brassica Oleracea]   . Codeine Nausea Only  . Corn-Containing Products Other (See Comments)  . Cow's Milk  [Lac Bovis] Cough and Other (See Comments)  . Gluten Meal Other (See Comments)    Bloating  . Soy Allergy Other (See Comments)    Bloating  . Latex Other (See Comments)    whelps    Review of Systems     Objective:    Physical Exam Constitutional:      Appearance: She is well-developed.  HENT:     Head: Normocephalic and atraumatic.  Cardiovascular:     Rate and Rhythm: Normal rate and regular rhythm.     Heart sounds: Normal heart sounds.  Pulmonary:     Effort: Pulmonary effort is normal.     Breath sounds: Normal breath sounds.  Musculoskeletal:     Comments: No CVA tenderness    Skin:    General: Skin is warm and dry.  Neurological:     Mental Status: She is alert and oriented to person, place, and time.  Psychiatric:        Behavior: Behavior normal.     BP 120/76   Pulse 71   Ht 5\' 8"  (1.727 m)   Wt 150 lb (68 kg)   SpO2 98%   BMI 22.81 kg/m  Wt Readings from Last 3 Encounters:  06/07/20 150 lb (68 kg)  01/15/20 148 lb (67.1 kg)  09/04/19 146 lb (66.2 kg)    Health Maintenance Due  Topic Date Due  . Hepatitis C Screening  Never done    There are no preventive care reminders to display for this patient.   Lab Results  Component Value Date   TSH 3.24 07/07/2019   Lab Results  Component Value Date   WBC 7.4 07/07/2019   HGB 14.6 07/07/2019   HCT 43.5 07/07/2019   MCV 92.8 07/07/2019   PLT 253 07/07/2019  Lab Results  Component Value Date   NA 145 07/07/2019   K 4.4 07/07/2019   CO2 20 07/07/2019    GLUCOSE 98 07/07/2019   BUN 17 07/07/2019   CREATININE 0.92 07/07/2019   BILITOT 0.6 07/07/2019   ALKPHOS 64 09/11/2016   AST 16 07/07/2019   ALT 13 07/07/2019   PROT 7.1 07/07/2019   ALBUMIN 4.1 09/11/2016   CALCIUM 9.6 07/07/2019   Lab Results  Component Value Date   CHOL 213 (H) 07/07/2019   Lab Results  Component Value Date   HDL 86 07/07/2019   Lab Results  Component Value Date   LDLCALC 113 (H) 07/07/2019   Lab Results  Component Value Date   TRIG 53 07/07/2019   Lab Results  Component Value Date   CHOLHDL 2.5 07/07/2019   No results found for: HGBA1C     Assessment & Plan:   Problem List Items Addressed This Visit    None    Visit Diagnoses    Dysuria    -  Primary   Relevant Orders   POCT URINALYSIS DIP (CLINITEK) (Completed)   WET PREP FOR [place], YEAST, CLUE   Urine Culture   Vaginosis       Relevant Orders   POCT URINALYSIS DIP (CLINITEK) (Completed)   WET PREP FOR TRICH, YEAST, CLUE   Bloating       Relevant Orders   US Abdomen Complete   US Pelvic Complete With Transvaginal   Generalized abdominal pain       Relevant Orders   US Abdomen Complete   US Pelvic Complete With Transvaginal   Early satiety         Early satiety/bloating- Discussed options.  She is very worried about possibility of ovarian cancer so we will get ultrasound of abdomen and pelvis for further work-up.  If this is negative consider repeat food sensitivity testing.  I discussed possibility of referring back to GI for further work-up possible endoscopy.  Dysuria-urinalysis only shows large amount of leuks negative for nitrates.  Terrence Dupont go ahead and treat with Bactrim since her symptoms do seem classic we will send urine for culture.  Will call with results once available.  Tinnitus-wet prep performed will call with results once available  Meds ordered this encounter  Medications  . sulfamethoxazole-trimethoprim (BACTRIM DS) 800-160 MG tablet    Sig: Take 1 tablet by  mouth 2 (two) times daily.    Dispense:  6 tablet    Refill:  0     Beatrice Lecher, MD

## 2020-06-08 LAB — WET PREP FOR TRICH, YEAST, CLUE
MICRO NUMBER:: 11083944
Specimen Quality: ADEQUATE

## 2020-06-09 LAB — URINE CULTURE
MICRO NUMBER:: 11085022
SPECIMEN QUALITY:: ADEQUATE

## 2020-06-09 NOTE — Progress Notes (Signed)
E.coli in urine. Bactrim given should treat. Follow up as needed.

## 2020-06-15 ENCOUNTER — Other Ambulatory Visit: Payer: Self-pay

## 2020-06-15 ENCOUNTER — Ambulatory Visit (INDEPENDENT_AMBULATORY_CARE_PROVIDER_SITE_OTHER): Payer: BC Managed Care – PPO

## 2020-06-15 DIAGNOSIS — R1084 Generalized abdominal pain: Secondary | ICD-10-CM | POA: Diagnosis not present

## 2020-06-15 DIAGNOSIS — R140 Abdominal distension (gaseous): Secondary | ICD-10-CM | POA: Diagnosis not present

## 2020-06-23 ENCOUNTER — Other Ambulatory Visit: Payer: Self-pay

## 2020-06-23 ENCOUNTER — Ambulatory Visit (INDEPENDENT_AMBULATORY_CARE_PROVIDER_SITE_OTHER): Payer: BC Managed Care – PPO

## 2020-06-23 DIAGNOSIS — R1084 Generalized abdominal pain: Secondary | ICD-10-CM | POA: Diagnosis not present

## 2020-06-23 DIAGNOSIS — R140 Abdominal distension (gaseous): Secondary | ICD-10-CM | POA: Diagnosis not present

## 2020-07-13 ENCOUNTER — Other Ambulatory Visit: Payer: Self-pay | Admitting: Family Medicine

## 2020-07-13 DIAGNOSIS — Z1231 Encounter for screening mammogram for malignant neoplasm of breast: Secondary | ICD-10-CM

## 2020-08-25 ENCOUNTER — Other Ambulatory Visit: Payer: Self-pay

## 2020-08-25 ENCOUNTER — Encounter: Payer: Self-pay | Admitting: Physician Assistant

## 2020-08-25 ENCOUNTER — Ambulatory Visit (INDEPENDENT_AMBULATORY_CARE_PROVIDER_SITE_OTHER): Payer: BC Managed Care – PPO

## 2020-08-25 ENCOUNTER — Ambulatory Visit (INDEPENDENT_AMBULATORY_CARE_PROVIDER_SITE_OTHER): Payer: BC Managed Care – PPO | Admitting: Physician Assistant

## 2020-08-25 DIAGNOSIS — E559 Vitamin D deficiency, unspecified: Secondary | ICD-10-CM

## 2020-08-25 DIAGNOSIS — Z0000 Encounter for general adult medical examination without abnormal findings: Secondary | ICD-10-CM | POA: Diagnosis not present

## 2020-08-25 DIAGNOSIS — Z780 Asymptomatic menopausal state: Secondary | ICD-10-CM

## 2020-08-25 DIAGNOSIS — M545 Low back pain, unspecified: Secondary | ICD-10-CM

## 2020-08-25 DIAGNOSIS — Z23 Encounter for immunization: Secondary | ICD-10-CM

## 2020-08-25 DIAGNOSIS — Z13220 Encounter for screening for lipoid disorders: Secondary | ICD-10-CM

## 2020-08-25 DIAGNOSIS — M48061 Spinal stenosis, lumbar region without neurogenic claudication: Secondary | ICD-10-CM

## 2020-08-25 DIAGNOSIS — M5136 Other intervertebral disc degeneration, lumbar region: Secondary | ICD-10-CM | POA: Insufficient documentation

## 2020-08-25 DIAGNOSIS — Z131 Encounter for screening for diabetes mellitus: Secondary | ICD-10-CM

## 2020-08-25 DIAGNOSIS — Z1159 Encounter for screening for other viral diseases: Secondary | ICD-10-CM

## 2020-08-25 DIAGNOSIS — G8929 Other chronic pain: Secondary | ICD-10-CM

## 2020-08-25 MED ORDER — AMBULATORY NON FORMULARY MEDICATION: 0 refills | Status: DC

## 2020-08-25 NOTE — Patient Instructions (Signed)
Health Maintenance After Age 65 After age 65, you are at a higher risk for certain long-term diseases and infections as well as injuries from falls. Falls are a major cause of broken bones and head injuries in people who are older than age 66. Getting regular preventive care can help to keep you healthy and well. Preventive care includes getting regular testing and making lifestyle changes as recommended by your health care provider. Talk with your health care provider about:  Which screenings and tests you should have. A screening is a test that checks for a disease when you have no symptoms.  A diet and exercise plan that is right for you. What should I know about screenings and tests to prevent falls? Screening and testing are the best ways to find a health problem early. Early diagnosis and treatment give you the best chance of managing medical conditions that are common after age 66. Certain conditions and lifestyle choices may make you more likely to have a fall. Your health care provider may recommend:  Regular vision checks. Poor vision and conditions such as cataracts can make you more likely to have a fall. If you wear glasses, make sure to get your prescription updated if your vision changes.  Medicine review. Work with your health care provider to regularly review all of the medicines you are taking, including over-the-counter medicines. Ask your health care provider about any side effects that may make you more likely to have a fall. Tell your health care provider if any medicines that you take make you feel dizzy or sleepy.  Osteoporosis screening. Osteoporosis is a condition that causes the bones to get weaker. This can make the bones weak and cause them to break more easily.  Blood pressure screening. Blood pressure changes and medicines to control blood pressure can make you feel dizzy.  Strength and balance checks. Your health care provider may recommend certain tests to check your  strength and balance while standing, walking, or changing positions.  Foot health exam. Foot pain and numbness, as well as not wearing proper footwear, can make you more likely to have a fall.  Depression screening. You may be more likely to have a fall if you have a fear of falling, feel emotionally low, or feel unable to do activities that you used to do.  Alcohol use screening. Using too much alcohol can affect your balance and may make you more likely to have a fall. What actions can I take to lower my risk of falls? General instructions  Talk with your health care provider about your risks for falling. Tell your health care provider if: ? You fall. Be sure to tell your health care provider about all falls, even ones that seem minor. ? You feel dizzy, sleepy, or off-balance.  Take over-the-counter and prescription medicines only as told by your health care provider. These include any supplements.  Eat a healthy diet and maintain a healthy weight. A healthy diet includes low-fat dairy products, low-fat (lean) meats, and fiber from whole grains, beans, and lots of fruits and vegetables. Home safety  Remove any tripping hazards, such as rugs, cords, and clutter.  Install safety equipment such as grab bars in bathrooms and safety rails on stairs.  Keep rooms and walkways well-lit. Activity   Follow a regular exercise program to stay fit. This will help you maintain your balance. Ask your health care provider what types of exercise are appropriate for you.  If you need a cane or   walker, use it as recommended by your health care provider.  Wear supportive shoes that have nonskid soles. Lifestyle  Do not drink alcohol if your health care provider tells you not to drink.  If you drink alcohol, limit how much you have: ? 0-1 drink a day for women. ? 0-2 drinks a day for men.  Be aware of how much alcohol is in your drink. In the [place] one drink equals one typical bottle of beer (12  oz), one-half glass of wine (5 oz), or one shot of hard liquor (1 oz).  Do not use any products that contain nicotine or tobacco, such as cigarettes and e-cigarettes. If you need help quitting, ask your health care provider. Summary  Having a healthy lifestyle and getting preventive care can help to protect your health and wellness after age 66.  Screening and testing are the best way to find a health problem early and help you avoid having a fall. Early diagnosis and treatment give you the best chance for managing medical conditions that are more common for people who are older than age 66.  Falls are a major cause of broken bones and head injuries in people who are older than age 66. Take precautions to prevent a fall at home.  Work with your health care provider to learn what changes you can make to improve your health and wellness and to prevent falls. This information is not intended to replace advice given to you by your health care provider. Make sure you discuss any questions you have with your health care provider. Document Revised: 11/28/2018 Document Reviewed: 06/20/2017 Elsevier Patient Education  2020 [employer] Inc[employer]  

## 2020-08-25 NOTE — Progress Notes (Signed)
Lynn Lloyd showed degenerative disc disease as suspected with some disc slippage over L2 and L3. Treatment plan stays the same. If you continued to have a lot of problems you would likely need MRI to get a better picture of soft tissues of the spine.

## 2020-08-25 NOTE — Progress Notes (Signed)
Subjective:     Lynn Lloyd is a 65 y.o. female and is here for a comprehensive physical exam. The patient reports problems - intermittent chronic low back pain. years of off and on. about 4 months ago worsened after she started running again. went to chiropracter helping. was having some radiation of pain into legs and buttocks but none now. no bowel or bladder dysfunction.   Social History   Socioeconomic History   Marital status: Married    Spouse name: Not on file   Number of children: Not on file   Years of education: Not on file   Highest education level: Not on file  Occupational History   Occupation: Glass blower/designer    Employer: [employer]  Tobacco Use   Smoking status: Never Smoker   Smokeless tobacco: Never Used  Vaping Use   Vaping Use: Never used  Substance and Sexual Activity   Alcohol use: Yes    Alcohol/week: 2.0 standard drinks    Types: 2 Standard drinks or equivalent per week    Comment: Occ   Drug use: No   Sexual activity: Yes    Partners: Male    Birth control/protection: Post-menopausal  Other Topics Concern   Not on file  Social History Narrative   Not on file   Social Determinants of Health   Financial Resource Strain: Not on file  Food Insecurity: Not on file  Transportation Needs: Not on file  Physical Activity: Not on file  Stress: Not on file  Social Connections: Not on file  Intimate Partner Violence: Not on file   Health Maintenance  Topic Date Due   COVID-19 Vaccine (3 - Pfizer risk 4-dose series) 12/25/2019   INFLUENZA VACCINE  11/18/2020 (Originally 03/21/2020)   PNA vac Low Risk Adult (1 of 2 - PCV13) 06/07/2021 (Originally 12/20/2019)   MAMMOGRAM  07/30/2021   TETANUS/TDAP  10/24/2021   PAP SMEAR-Modifier  07/30/2022   COLONOSCOPY (Pts 45-75yrs Insurance coverage will need to be confirmed)  02/07/2024   DEXA SCAN  Completed   Hepatitis C Screening  Completed   HIV Screening  Completed    The  following portions of the patient's history were reviewed and updated as appropriate: allergies, current medications, past family history, past medical history, past social history, past surgical history and problem list.  Review of Systems Pertinent items noted in HPI and remainder of comprehensive ROS otherwise negative.   Objective:    There were no vitals taken for this visit. General appearance: alert, cooperative and appears stated age Head: Normocephalic, without obvious abnormality, atraumatic Eyes: conjunctivae/corneas clear. PERRL, EOM's intact. Fundi benign. Ears: normal TM's and external ear canals both ears Nose: Nares normal. Septum midline. Mucosa normal. No drainage or sinus tenderness. Throat: lips, mucosa, and tongue normal; teeth and gums normal Neck: no adenopathy, no carotid bruit, no JVD, supple, symmetrical, trachea midline and thyroid not enlarged, symmetric, no tenderness/mass/nodules Back: symmetric, no curvature. ROM normal. No CVA tenderness., negative SLR bilaterally. lower ext strength 5/5. NROM at waist and bilateral hips. Lungs: clear to auscultation bilaterally Breasts: normal appearance, no masses or tenderness Heart: regular rate and rhythm, S1, S2 normal, no murmur, click, rub or gallop Abdomen: soft, non-tender; bowel sounds normal; no masses,  no organomegaly Extremities: extremities normal, atraumatic, no cyanosis or edema Pulses: 2+ and symmetric Skin: Skin color, texture, turgor normal. No rashes or lesions Lymph nodes: Cervical, supraclavicular, and axillary nodes normal. Neurologic: Alert and oriented X 3, normal strength and tone.  Normal symmetric reflexes. Normal coordination and gait    .Marland Kitchen Depression screen [facility] 2/9 08/25/2020 06/07/2020 10/09/2017  Decreased Interest 0 0 0  Down, Depressed, Hopeless 1 0 0  PHQ - 2 Score 1 0 0       Assessment:    Healthy female exam.      Plan:    Marland KitchenMarland KitchenAngella was seen today for annual exam.  Diagnoses  and all orders for this visit:  Routine physical examination -     Hepatitis C Antibody -     COMPLETE METABOLIC PANEL WITH GFR -     Lipid Panel w/reflex Direct LDL -     DG Lumbar Spine Complete -     TSH  Encounter for hepatitis C screening test for low risk patient -     Hepatitis C Antibody  Screening for lipid disorders -     Lipid Panel w/reflex Direct LDL  Screening for diabetes mellitus -     COMPLETE METABOLIC PANEL WITH GFR  Chronic bilateral low back pain without sciatica  Need for shingles vaccine -     AMBULATORY NON FORMULARY MEDICATION; shingrx 2 doses to prevent shingles.  Vitamin D insufficiency  Post-menopausal   .. Discussed 150 minutes of exercise a week.  Encouraged vitamin D 1000 units and Calcium 1300mg  or 4 servings of dairy a day.  Fasting labs ordered.  No need for pap.  Colonoscopy UTD. Mammogram ordered and UTD. Bone density UTD. Declined pneumonia and flu vaccine.  UTD flu/Tdap.  Shingles vaccines recommended and printed rx.  Discussed low back pain ongoing for months. No red flags. chiropractor is helping but wondering what else to do.  Xray ordered.  Discussed core exercises and exercises to avoid.  Likely DDD. Encouraged massage, heat, ice, tens unit, core exercises.  NSAIDs prn.  Follow up as needed.  See After Visit Summary for Counseling Recommendations

## 2020-08-26 LAB — COMPLETE METABOLIC PANEL WITHOUT GFR
AG Ratio: 1.7 (calc) (ref 1.0–2.5)
ALT: 17 U/L (ref 6–29)
AST: 19 U/L (ref 10–35)
Albumin: 4.5 g/dL (ref 3.6–5.1)
Alkaline phosphatase (APISO): 66 U/L (ref 37–153)
BUN: 13 mg/dL (ref 7–25)
CO2: 29 mmol/L (ref 20–32)
Calcium: 9.7 mg/dL (ref 8.6–10.4)
Chloride: 103 mmol/L (ref 98–110)
Creat: 0.83 mg/dL (ref 0.50–0.99)
GFR, Est African American: 86 mL/min/1.73m2
GFR, Est Non African American: 74 mL/min/1.73m2
Globulin: 2.6 g/dL (ref 1.9–3.7)
Glucose, Bld: 87 mg/dL (ref 65–99)
Potassium: 4.2 mmol/L (ref 3.5–5.3)
Sodium: 140 mmol/L (ref 135–146)
Total Bilirubin: 0.5 mg/dL (ref 0.2–1.2)
Total Protein: 7.1 g/dL (ref 6.1–8.1)

## 2020-08-26 LAB — HEPATITIS C ANTIBODY
Hepatitis C Ab: NONREACTIVE
SIGNAL TO CUT-OFF: 0.01

## 2020-08-26 LAB — TSH: TSH: 3.29 m[IU]/L (ref 0.40–4.50)

## 2020-08-26 LAB — LIPID PANEL W/REFLEX DIRECT LDL
Cholesterol: 237 mg/dL — ABNORMAL HIGH (ref <200)
HDL: 86 mg/dL (ref >=50)
LDL Cholesterol (Calc): 135 mg/dL (calc) — ABNORMAL HIGH
Non-HDL Cholesterol (Calc): 151 mg/dL (calc) — ABNORMAL HIGH (ref <130)
Total CHOL/HDL Ratio: 2.8 (calc) (ref <5.0)
Triglycerides: 67 mg/dL (ref <150)

## 2020-08-27 ENCOUNTER — Encounter: Payer: Self-pay | Admitting: Physician Assistant

## 2020-08-27 DIAGNOSIS — E7800 Pure hypercholesterolemia, unspecified: Secondary | ICD-10-CM | POA: Insufficient documentation

## 2020-08-27 DIAGNOSIS — M5136 Other intervertebral disc degeneration, lumbar region: Secondary | ICD-10-CM

## 2020-08-27 DIAGNOSIS — M545 Low back pain, unspecified: Secondary | ICD-10-CM | POA: Insufficient documentation

## 2020-08-27 DIAGNOSIS — G8929 Other chronic pain: Secondary | ICD-10-CM | POA: Insufficient documentation

## 2020-08-27 NOTE — Progress Notes (Signed)
Lynn Lloyd,   Kidney, liver, glucose look great.  HDL is great. LDL up some at 135. 10 year CV risk is 4.2 percent. Still pretty low. Continue healthy diet and active lifestyle. Recheck in 1 year.  Thyroid stable.

## 2020-09-09 ENCOUNTER — Ambulatory Visit: Payer: BC Managed Care – PPO

## 2020-09-11 ENCOUNTER — Other Ambulatory Visit: Payer: Self-pay

## 2020-09-11 ENCOUNTER — Ambulatory Visit (INDEPENDENT_AMBULATORY_CARE_PROVIDER_SITE_OTHER): Payer: BC Managed Care – PPO

## 2020-09-11 DIAGNOSIS — M48061 Spinal stenosis, lumbar region without neurogenic claudication: Secondary | ICD-10-CM

## 2020-09-11 DIAGNOSIS — M5136 Other intervertebral disc degeneration, lumbar region: Secondary | ICD-10-CM

## 2020-09-13 ENCOUNTER — Other Ambulatory Visit: Payer: Self-pay | Admitting: Physician Assistant

## 2020-09-13 DIAGNOSIS — G8929 Other chronic pain: Secondary | ICD-10-CM

## 2020-09-13 DIAGNOSIS — M48061 Spinal stenosis, lumbar region without neurogenic claudication: Secondary | ICD-10-CM

## 2020-09-13 DIAGNOSIS — Z1231 Encounter for screening mammogram for malignant neoplasm of breast: Secondary | ICD-10-CM

## 2020-09-13 DIAGNOSIS — M5136 Other intervertebral disc degeneration, lumbar region: Secondary | ICD-10-CM

## 2020-09-13 NOTE — Progress Notes (Signed)
Referral for PT placed

## 2020-09-13 NOTE — Telephone Encounter (Signed)
You certainly have reason for back pain. Multilevel degenerative changes in the lumbar spine but more pronouned around L4/L5. I want you to start with an appt with Dr. Darene Lamer. I think it might consider trial of epidural injections. I'm also going to place referral for formal PT.

## 2020-09-15 ENCOUNTER — Ambulatory Visit (INDEPENDENT_AMBULATORY_CARE_PROVIDER_SITE_OTHER): Payer: BC Managed Care – PPO | Admitting: Sports Medicine

## 2020-09-15 ENCOUNTER — Ambulatory Visit
Admission: RE | Admit: 2020-09-15 | Discharge: 2020-09-15 | Disposition: A | Discharge status: Home / Self Care | Payer: BC Managed Care – PPO | Source: Ambulatory Visit

## 2020-09-15 ENCOUNTER — Other Ambulatory Visit: Payer: Self-pay

## 2020-09-15 DIAGNOSIS — Z1231 Encounter for screening mammogram for malignant neoplasm of breast: Secondary | ICD-10-CM

## 2020-09-15 DIAGNOSIS — M48061 Spinal stenosis, lumbar region without neurogenic claudication: Secondary | ICD-10-CM

## 2020-09-15 MED ORDER — MELOXICAM 15 MG PO TABS: ORAL_TABLET | ORAL | 3 refills | Status: DC

## 2020-09-15 NOTE — Progress Notes (Signed)
    Procedures performed today:    None.  Independent interpretation of notes and tests performed by another provider:   Lumbar spine MRI personally reviewed, there is moderate to severe spinal stenosis at the L4-L5 level multifactorial from disc retrusion and facet hypertrophy.  Brief History, Exam, Impression, and Recommendations:    Lumbar spinal stenosis This is a pleasant 65 year old female, for years she has had axial back pain with occasional radiation down the left and right leg, more recently to the left side. Ultimately an MRI was obtained the results of which will be dictated above. We will start conservatively, meloxicam, formal physical therapy. We will do this for 6 weeks before considering epidural. I did explain the anatomy and evolutionary anthropology of the human spine. Ultimate goal is to get back to running and hiking. Return to see me in 6 weeks.     ___________________________________________ Gwen Her. Dianah Field, M.D., ABFM., CAQSM. Primary Care and [place] Instructor of [place] [employer] [facility] [employer]

## 2020-09-15 NOTE — Assessment & Plan Note (Addendum)
This is a pleasant 65 year old female, for years she has had axial back pain with occasional radiation down the left and right leg, more recently to the left side. Ultimately an MRI was obtained the results of which will be dictated above. We will start conservatively, meloxicam, formal physical therapy. We will do this for 6 weeks before considering epidural. I did explain the anatomy and evolutionary anthropology of the human spine. Ultimate goal is to get back to running and hiking. Return to see me in 6 weeks.

## 2020-09-15 NOTE — Patient Instructions (Signed)
[employer] neurological surgery (7th ed., pp. 2373-2383). [place], [place]: Elsevier."> [employer] neurological surgery (7th ed., pp. 2344-2347). [place], [place]: Elsevier.">  Spinal Stenosis  Spinal stenosis is a condition that happens when the spinal canal narrows. The spinal canal is the space between the bones of your spine (vertebrae). This narrowing puts pressure on the spinal cord or nerves. Spinal stenosis can affect the vertebrae in the neck, upper back, and lower back. This condition can range from mild to severe. In some cases, there are no symptoms. What are the causes? This condition is caused by areas of bone pushing into the spinal canal. This condition may be present at birth (congenital), or it may be caused by:  Slow breakdown of your vertebrae (spinal degeneration). This usually starts around 66 years of age.  Injury (trauma) to your spine.  Tumors in your spine.  Calcium deposits in your spine. What increases the risk? The following factors may make you more likely to develop this condition:  Being older than age 50.  Having a problem present at birth with an abnormally shaped spine (congenitalspinal deformity), such as scoliosis.  Having arthritis. What are the signs or symptoms? Symptoms of this condition include:  Pain in the neck or back that is generally worse with activities, particularly when you stand or walk.  Numbness, tingling, hot or cold sensations, weakness, or tiredness (fatigue) in your leg or legs.  Pain going from the buttock, down the thigh, and to the calf (sciatica). This can happen in one or both legs.  Frequent episodes of falling.  A foot-slapping gait that leads to muscle weakness. In more severe cases, you may develop:  Problems having a bowel movement or urinating.  Difficulty having sex.  Loss of feeling in your legs and inability to walk. Symptoms may come on slowly and get worse over time. In some cases, there  are no symptoms. How is this diagnosed? This condition is diagnosed based on your medical history and a physical exam. You will also have tests, such as an MRI, a CT scan, or an X-ray. How is this treated? Treatment for this condition often focuses on managing your pain and any other symptoms. Treatment may include:  Practicing good posture to lessen pressure on your nerves.  Exercising to strengthen muscles, build endurance, improve balance, and maintain range of motion. This may include physical therapy to restore movement and strength to your back.  Losing weight, if needed.  Medicines to reduce inflammation or pain. This may include a medicine that is injected into your spine (steroidinjection).  Assistive devices, such as a corset or brace. In some cases, surgery may be needed. The most common procedure is decompression laminectomy. This is done to remove excess bone that puts pressure on your nerve roots. Follow these instructions at home: Managing pain, stiffness, and swelling  Practice good posture. If you were given a brace or a corset, wear it as told by your health care provider.  Maintain a healthy weight. Talk with your health care provider if you need help losing weight.  If directed, apply heat to the affected area as often as told by your health care provider. Use the heat source that your health care provider recommends, such as a moist heat pack or a heating pad. ? Place a towel between your skin and the heat source. ? Leave the heat on for 20-30 minutes. ? Remove the heat if your skin turns bright red. This is especially important if   you are unable to feel pain, heat, or cold. You may have a greater risk of getting burned.   Activity  Do all exercises and stretches as told by your health care provider.  Do not do any activities that cause pain. Ask your health care provider what activities are safe for you.  Do not lift anything that is heavier than 10 lb (4.5 kg),  or the limit that you are told by your health care provider.  Return to your normal activities as told by your health care provider. Ask your health care provider what activities are safe for you. General instructions  Take over-the-counter and prescription medicines only as told by your health care provider.  Do not use any products that contain nicotine or tobacco, such as cigarettes, e-cigarettes, and chewing tobacco. If you need help quitting, ask your health care provider.  Eat a healthy diet. This includes plenty of fruits and vegetables, whole grains, and low-fat (lean) protein.  Keep all follow-up visits as told by your health care provider. This is important. Contact a health care provider if:  Your symptoms do not get better or they get worse.  You have a fever. Get help right away if:  You have new pain or symptoms of severe pain, such as: ? New or worsening pain in your neck or upper back. ? Severe pain that cannot be controlled with medicines. ? A severe headache that gets worse when you stand.  You are dizzy.  You have vision problems, such as blurred vision or double vision.  You have nausea or you vomit.  You develop new or worsening numbness or tingling in your back or legs.  You have pain, redness, swelling, or warmth in your arm or leg. Summary  Spinal stenosis is a condition that happens when the spinal canal narrows. The spinal canal is the space between the bones of your spine (vertebrae). This narrowing puts pressure on the spinal cord or nerves.  This condition may be caused by a birth defect, breakdown of your vertebrae, trauma, tumors, or calcium deposits.  Spinal stenosis can cause numbness, weakness, or pain in the buttocks, neck, back, and legs.  This condition is usually diagnosed with your medical history, a physical exam, and tests, such as an MRI, a CT scan, or an X-ray. This information is not intended to replace advice given to you by your  health care provider. Make sure you discuss any questions you have with your health care provider. Document Revised: 06/05/2019 Document Reviewed: 06/05/2019 Elsevier Patient Education  2021 Elsevier Inc.  

## 2020-09-16 NOTE — Progress Notes (Signed)
Normal mammogram. Follow up in 1 year.

## 2020-09-17 ENCOUNTER — Other Ambulatory Visit: Payer: Self-pay

## 2020-09-17 ENCOUNTER — Encounter: Payer: Self-pay | Admitting: Physical Therapy

## 2020-09-17 ENCOUNTER — Ambulatory Visit (INDEPENDENT_AMBULATORY_CARE_PROVIDER_SITE_OTHER): Payer: BC Managed Care – PPO | Admitting: Physical Therapy

## 2020-09-17 DIAGNOSIS — R29898 Other symptoms and signs involving the musculoskeletal system: Secondary | ICD-10-CM

## 2020-09-17 DIAGNOSIS — G8929 Other chronic pain: Secondary | ICD-10-CM | POA: Diagnosis not present

## 2020-09-17 DIAGNOSIS — M545 Low back pain, unspecified: Secondary | ICD-10-CM

## 2020-09-17 DIAGNOSIS — M6281 Muscle weakness (generalized): Secondary | ICD-10-CM | POA: Diagnosis not present

## 2020-09-17 NOTE — Therapy (Signed)
[place], [place], [address] Phone: [phone]   Fax:  [phone]  Physical Therapy Evaluation  Patient Details  Name: Lynn Lloyd MRN: [id number] Date of Birth: 1956-05-28 Referring Provider (PT): thekkekandam   Encounter Date: 09/17/2020   PT End of Session - 09/17/20 1856    Visit Number 1    Number of Visits 6    Date for PT Re-Evaluation 10/29/20    PT Start Time 3149    PT Stop Time 1530    PT Time Calculation (min) 45 min    Activity Tolerance Patient tolerated treatment well    Behavior During Therapy [facility] for tasks assessed/performed           Past Medical History:  Diagnosis Date  . Chronic kidney disease   . Constipation   . Endometriosis   . Hemorrhoid     Past Surgical History:  Procedure Laterality Date  . ADENOIDECTOMY    . BUNIONECTOMY  12/11  . CHOLECYSTECTOMY    . LAPAROSCOPY     endometriosis  . SALIVARY GLAND SURGERY     removal  . TONSILLECTOMY AND ADENOIDECTOMY      There were no vitals filed for this visit.    Subjective Assessment - 09/17/20 1450    Subjective Pt states she has been having back pain off and on for years and it flared up this fall as she has been running more and doing less yoga. Symptoms go down left leg intermittently. Pt has been going to a chiropractor for years and it was recommended that she have an x ray and MRI.  Imaging shows spinal stenosis. MD referred her to PT. Pain worse with lying down, sitting straight backed. Pain is better with movement    Pertinent History physical therapy for cervical radiculopathy 2007, bunion surgery bilat feet    Diagnostic tests MRI shows spinal stenosis    Currently in Pain? No/denies              [facility] PT Assessment - 09/17/20 0001      Assessment   Medical Diagnosis spinal stenosis of lumbar region without neurogenic claudication    Referring Provider (PT) [email or web address]      Balance  Screen   Has the patient fallen in the past 6 months No      Prior Function   Level of Independence Independent      Observation/Other Assessments   Focus on Therapeutic Outcomes (FOTO)  functional status measure 50      ROM / Strength   AROM / PROM / Strength AROM;Strength      AROM   AROM Assessment Site Lumbar    Lumbar Flexion WFL    Lumbar Extension WFL - pain end range    Lumbar - Right Side Bend WFL    Lumbar - Left Side Bend WFL    Lumbar - Right Rotation WFL pain end range    Lumbar - Left Rotation WFL pain end range      Strength   Overall Strength Comments overall LE strength 4+/5    Strength Assessment Site Hip    Right/Left Hip Right;Left    Lumbar Flexion --   decreased TA strength and endurance     Flexibility   Soft Tissue Assessment /Muscle Length yes    Hamstrings hypermobile      Palpation   SI assessment  hypomobile    Palpation comment increased mm tightness Lt lumbar paraspinals, Lt  piriformis                      Objective measurements completed on examination: See above findings.       [facility] Adult PT Treatment/Exercise - 09/17/20 0001      Exercises   Exercises Lumbar      Lumbar Exercises: Supine   Ab Set 5 reps;5 seconds    AB Set Limitations then ab set with clam shell x 5, ab set with heel slides x 5      Manual Therapy   Manual Therapy Soft tissue mobilization    Soft tissue mobilization STM lumbar paraspinals on Lt, Lt piriformis                  PT Education - 09/17/20 1537    Education Details PT POC and goals, HEP    Person(s) Educated Patient    Methods Explanation;Demonstration;Handout    Comprehension Returned demonstration;Verbalized understanding               PT Long Term Goals - 09/17/20 1616      PT LONG TERM GOAL #1   Title Pt will be independent with HEP    Time 6    Period Weeks    Status New    Target Date 10/29/20      PT LONG TERM GOAL #2   Title Pt will improve FOTO to  >=72 to demo improved functional mobility    Time 6    Period Weeks    Status New    Target Date 10/29/20      PT LONG TERM GOAL #3   Title Pt will return to yoga practice safely with no increase in symptoms    Time 6    Period Weeks    Status New    Target Date 10/29/20      PT LONG TERM GOAL #4   Title Pt will vacuum x 20 minutes with no increase in symptoms    Time 6    Period Weeks    Status New    Target Date 10/29/20                  Plan - 09/17/20 1600    Clinical Impression Statement Pt presents with decreased core strength, endurance and coordination, increased pain and increased mm spasticity. Pt will benefit from skilled PT to address deficits and progress functional mobility    Personal Factors and Comorbidities Comorbidity 1;Behavior Pattern    Examination-Activity Limitations Sleep;Lift;Carry    Examination-Participation Restrictions Cleaning;Community Activity;Yard Work    Stability/Clinical Decision Making Stable/Uncomplicated    Clinical Decision Making Low    Rehab Potential Good    PT Frequency 1x / week    PT Duration 6 weeks    PT Treatment/Interventions Dry needling;Taping;Manual techniques;Patient/family education;Therapeutic activities;Therapeutic exercise;Neuromuscular re-education;Electrical Stimulation;Cryotherapy;Moist Heat;Iontophoresis 4mg /ml Dexamethasone    PT Next Visit Plan progress core strength, dry needling if indicated. manual to piriformis and lumbar paraspinals. appropriate/inappropriate yoga poses    PT Home Exercise Plan Access Code: [email or web address]    Consulted and Agree with Plan of Care Patient           Patient will benefit from skilled therapeutic intervention in order to improve the following deficits and impairments:  Pain,Decreased coordination,Decreased endurance,Increased muscle spasms,Decreased strength  Visit Diagnosis: Chronic left-sided low back pain without sciatica - Plan: PT plan of care cert/re-cert  Muscle  weakness (generalized) - Plan: PT plan of care cert/re-cert  Other symptoms and signs involving the musculoskeletal system - Plan: PT plan of care cert/re-cert     Problem List Patient Active Problem List   Diagnosis Date Noted  . Elevated LDL cholesterol level 08/27/2020  . Lumbar spinal stenosis 08/25/2020  . No energy 01/20/2018  . Chronic idiopathic constipation 01/18/2018  . RLQ abdominal pain 01/18/2018  . Bronchospasm 11/15/2017  . Allergic rhinitis 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 11/15/2017  . Environmental and seasonal allergies 11/15/2017  . Encounter for adjustment or management of vascular access device 09/13/2016  . Right shoulder pain 03/02/2016  . Right knee pain 05/06/2015  . Post concussion syndrome 11/07/2013  . Abdominal pain, other specified site 10/03/2010  . POSTMENOPAUSAL SYNDROME 12/15/2009  . FINGER PAIN 10/22/2009  . PAIN IN JOINT, MULTIPLE SITES 09/28/2009  . TINNITUS 07/13/2009  . EAR PAIN, RIGHT 07/13/2009  . SOLAR KERATOSIS 03/31/2009  . SEBORRHEIC KERATOSIS 08/12/2008  . NEVUS, ATYPICAL 01/09/2008  . POSTMENOPAUSAL STATUS 12/06/2007  . Other malaise and fatigue 12/06/2007  . WARTS, MULTIPLE 09/09/2007  . SPINAL STENOSIS, CERVICAL 09/09/2007  . NEOPLASM, SKIN, UNCERTAIN BEHAVIOR [id number]   Miko Markwood, PT  Deakin Lacek 09/17/2020, 4:21 PM  [facility] [place], [place], [address] Phone: [phone]   Fax:  [phone]  Name: Lynn Lloyd MRN: [id number] Date of Birth: [birth year]

## 2020-09-17 NOTE — Patient Instructions (Signed)
Access Code: [email or web address] URL: [email or web address] Date: 09/17/2020 Prepared by: Isabelle Course  Exercises Supine Transversus Abdominis Palpation - 1 x daily - 7 x weekly - 3 sets - 10 reps Supine Transversus Abdominis Bracing with Double Leg Fallout - 1 x daily - 7 x weekly - 3 sets - 10 reps Supine Transversus Abdominis Bracing with Leg Extension - 1 x daily - 7 x weekly - 3 sets - 10 reps

## 2020-09-23 ENCOUNTER — Encounter: Payer: BC Managed Care – PPO | Admitting: Physical Therapy

## 2020-09-27 ENCOUNTER — Ambulatory Visit (INDEPENDENT_AMBULATORY_CARE_PROVIDER_SITE_OTHER): Payer: BC Managed Care – PPO | Admitting: Physical Therapy

## 2020-09-27 ENCOUNTER — Encounter: Payer: Self-pay | Admitting: Physical Therapy

## 2020-09-27 ENCOUNTER — Other Ambulatory Visit: Payer: Self-pay

## 2020-09-27 DIAGNOSIS — M6281 Muscle weakness (generalized): Secondary | ICD-10-CM | POA: Diagnosis not present

## 2020-09-27 DIAGNOSIS — M545 Low back pain, unspecified: Secondary | ICD-10-CM

## 2020-09-27 DIAGNOSIS — G8929 Other chronic pain: Secondary | ICD-10-CM | POA: Diagnosis not present

## 2020-09-27 DIAGNOSIS — R29898 Other symptoms and signs involving the musculoskeletal system: Secondary | ICD-10-CM | POA: Diagnosis not present

## 2020-09-27 NOTE — Therapy (Signed)
[place], [place], [address] Phone: [phone]   Fax:  [phone]  Physical Therapy Treatment  Patient Details  Name: Lynn Lloyd MRN: [id number] Date of Birth: [birth year] Referring Provider (PT): thekkekandam   Encounter Date: 09/27/2020   PT End of Session - 09/27/20 1342    Visit Number 2    Number of Visits 6    Date for PT Re-Evaluation 10/29/20    PT Start Time 7616    PT Stop Time 1444    PT Time Calculation (min) 62 min    Activity Tolerance Patient tolerated treatment well    Behavior During Therapy [facility] for tasks assessed/performed           Past Medical History:  Diagnosis Date  . Chronic kidney disease   . Constipation   . Endometriosis   . Hemorrhoid     Past Surgical History:  Procedure Laterality Date  . ADENOIDECTOMY    . BUNIONECTOMY  12/11  . CHOLECYSTECTOMY    . LAPAROSCOPY     endometriosis  . SALIVARY GLAND SURGERY     removal  . TONSILLECTOMY AND ADENOIDECTOMY      There were no vitals filed for this visit.   Subjective Assessment - 09/27/20 1343    Subjective Pt reports she is not feeling good today, the pain is all in the Lt buttocks, She was very active over the weekend and wonder if she over did it    Currently in Pain? Yes    Pain Score 4     Pain Location Buttocks    Pain Orientation Left    Pain Descriptors / Indicators Tightness;Spasm    Aggravating Factors  not sure    Pain Relieving Factors keep repositioning , heat                             [facility] Adult PT Treatment/Exercise - 09/27/20 0001      Self-Care   Self-Care Other Self-Care Comments    Other Self-Care Comments  reviewed current HEP, foam rolling into Lt gluts      Lumbar Exercises: Quadruped   Madcat/Old Horse 10 reps    Other Quadruped Lumbar Exercises childs pose      Modalities   Modalities Electrical Stimulation;Moist Heat      Moist Heat  Therapy   Number Minutes Moist Heat 15 Minutes    Moist Heat Location Lumbar Spine   gluts     Electrical Stimulation   Electrical Stimulation Location Lt lumbar and gluts    Electrical Stimulation Action IFC    Electrical Stimulation Parameters to tolerance    Electrical Stimulation Goals Tone;Pain      Manual Therapy   Manual Therapy Soft tissue mobilization    Manual therapy comments skilled palpation and monitoring of sift tissue during DN    Soft tissue mobilization STM to Lt gluts and lumbar paraspinals            Trigger Point Dry Needling - 09/27/20 0001    Consent Given? Yes    Education Handout Provided Previously provided    Muscles Treated Back/Hip Gluteus maximus;Gluteus medius;Lumbar multifidi;Erector spinae    Electrical Stimulation Performed with Dry Needling Yes    Gluteus Medius Response Palpable increased muscle length   Lt   Gluteus Maximus Response Palpable increased muscle length   Lt   Erector spinae Response Palpable increased muscle length;Twitch response  elicited   Lt with stim   Lumbar multifidi Response Palpable increased muscle length;Twitch response elicited   Lt with stim                    PT Long Term Goals - 09/17/20 1616      PT LONG TERM GOAL #1   Title Pt will be independent with HEP    Time 6    Period Weeks    Status New    Target Date 10/29/20      PT LONG TERM GOAL #2   Title Pt will improve FOTO to >=72 to demo improved functional mobility    Time 6    Period Weeks    Status New    Target Date 10/29/20      PT LONG TERM GOAL #3   Title Pt will return to yoga practice safely with no increase in symptoms    Time 6    Period Weeks    Status New    Target Date 10/29/20      PT LONG TERM GOAL #4   Title Pt will vacuum x 20 minutes with no increase in symptoms    Time 6    Period Weeks    Status New    Target Date 10/29/20                 Plan - 09/27/20 1434    Clinical Impression Statement Tillman Abide  presented with increased Lt buttock pain today, she responded well to DN and manual work with increased soft tissue pliability.  She is going to slowly add in some of her yoga and listen to her body and takes rest breaks if needed.  She was very tight in the Lt lower erector spinae and may need more DN there.  She is going to limit how long she spends with one leg up while working on her computer.  Only her second visit , no goals met at this time.    Rehab Potential Good    PT Frequency 1x / week    PT Duration 6 weeks    PT Treatment/Interventions Dry needling;Taping;Manual techniques;Patient/family education;Therapeutic activities;Therapeutic exercise;Neuromuscular re-education;Electrical Stimulation;Cryotherapy;Moist Heat;Iontophoresis 35m/ml Dexamethasone    PT Next Visit Plan assess response to DN, progress core stability    PT Home Exercise Plan Access Code: [email or web address]   Consulted and Agree with Plan of Care Patient           Patient will benefit from skilled therapeutic intervention in order to improve the following deficits and impairments:  Pain,Decreased coordination,Decreased endurance,Increased muscle spasms,Decreased strength  Visit Diagnosis: Chronic left-sided low back pain without sciatica  Muscle weakness (generalized)  Other symptoms and signs involving the musculoskeletal system     Problem List Patient Active Problem List   Diagnosis Date Noted  . Elevated LDL cholesterol level 08/27/2020  . Lumbar spinal stenosis 08/25/2020  . No energy 01/20/2018  . Chronic idiopathic constipation 01/18/2018  . RLQ abdominal pain 01/18/2018  . Bronchospasm 11/15/2017  . Allergic rhinitis 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 11/15/2017  . Environmental and seasonal allergies 11/15/2017  . Encounter for adjustment or management of vascular access device 09/13/2016  . Right shoulder pain 03/02/2016  . Right knee pain 05/06/2015  . Post concussion syndrome 11/07/2013  . Abdominal pain, other  specified site 10/03/2010  . POSTMENOPAUSAL SYNDROME 12/15/2009  . FINGER PAIN 10/22/2009  . PAIN IN JOINT, MULTIPLE SITES 09/28/2009  . TINNITUS 07/13/2009  . EAR PAIN,  RIGHT 07/13/2009  . SOLAR KERATOSIS 03/31/2009  . SEBORRHEIC KERATOSIS 08/12/2008  . NEVUS, ATYPICAL 01/09/2008  . POSTMENOPAUSAL STATUS 12/06/2007  . Other malaise and fatigue 12/06/2007  . WARTS, MULTIPLE 09/09/2007  . SPINAL STENOSIS, CERVICAL 09/09/2007  . NEOPLASM, SKIN, UNCERTAIN BEHAVIOR 25/85/2778    Jeral Pinch PT  09/27/2020, 2:40 PM  [facility] [place], [place], [address] Phone: [phone]   Fax:  [phone]  Name: JILLYN STACEY MRN: [id number] Date of Birth: May 07, 1956

## 2020-09-27 NOTE — Patient Instructions (Signed)
Access Code: [email or web address] URL: [email or web address] Date: 09/27/2020 Prepared by: Jeral Pinch  Patient Education Trigger Point Dry Needling

## 2020-09-29 ENCOUNTER — Encounter: Payer: BC Managed Care – PPO | Admitting: Physical Therapy

## 2020-10-01 ENCOUNTER — Other Ambulatory Visit: Payer: Self-pay

## 2020-10-01 ENCOUNTER — Ambulatory Visit: Payer: BC Managed Care – PPO | Admitting: Physical Therapy

## 2020-10-01 DIAGNOSIS — M545 Low back pain, unspecified: Secondary | ICD-10-CM | POA: Diagnosis not present

## 2020-10-01 DIAGNOSIS — G8929 Other chronic pain: Secondary | ICD-10-CM | POA: Diagnosis not present

## 2020-10-01 DIAGNOSIS — R29898 Other symptoms and signs involving the musculoskeletal system: Secondary | ICD-10-CM | POA: Diagnosis not present

## 2020-10-01 DIAGNOSIS — M6281 Muscle weakness (generalized): Secondary | ICD-10-CM

## 2020-10-01 NOTE — Therapy (Signed)
[place], [place], [address] Phone: [phone]   Fax:  [phone]  Physical Therapy Treatment  Patient Details  Name: Lynn Lloyd MRN: [id number] Date of Birth: 12/07/1955 Referring Provider (PT): thekkekandam   Encounter Date: 10/01/2020   PT End of Session - 10/01/20 1528    Visit Number 3    Number of Visits 6    Date for PT Re-Evaluation 10/29/20    PT Start Time 7169    PT Stop Time 1527    PT Time Calculation (min) 42 min    Activity Tolerance Patient tolerated treatment well    Behavior During Therapy [facility] for tasks assessed/performed           Past Medical History:  Diagnosis Date  . Chronic kidney disease   . Constipation   . Endometriosis   . Hemorrhoid     Past Surgical History:  Procedure Laterality Date  . ADENOIDECTOMY    . BUNIONECTOMY  12/11  . CHOLECYSTECTOMY    . LAPAROSCOPY     endometriosis  . SALIVARY GLAND SURGERY     removal  . TONSILLECTOMY AND ADENOIDECTOMY      There were no vitals filed for this visit.   Subjective Assessment - 10/01/20 1443    Subjective Pt states she feels much better than last visit. States she has been performing her HEP    Patient Stated Goals get stronger    Currently in Pain? Yes    Pain Score 1     Pain Location Back    Pain Orientation Left    Pain Descriptors / Indicators Aching    Pain Type Chronic pain    Pain Onset More than a month ago    Pain Frequency Intermittent                             [facility] Adult PT Treatment/Exercise - 10/01/20 0001      Lumbar Exercises: Standing   Other Standing Lumbar Exercises tall kneeling diagonals x 10 bilat red TB      Lumbar Exercises: Supine   Ab Set 5 reps;5 seconds    AB Set Limitations x 5 with clam sheel then x 5with heel slides    Dead Bug 20 reps    Dead Bug Limitations cues for TA contraction      Lumbar Exercises: Quadruped   Opposite  Arm/Leg Raise Right arm/Left leg;Left arm/Right leg;10 reps      Manual Therapy   Manual therapy comments skilled palpation to assess response to dry needling    Soft tissue mobilization STM lt glutes and lumbar paraspinals            Trigger Point Dry Needling - 10/01/20 0001    Education Handout Provided Previously provided    Gluteus Medius Response Twitch response elicited    Gluteus Maximus Response Palpable increased muscle length    Erector spinae Response Twitch response elicited    Lumbar multifidi Response Twitch response elicited                     PT Long Term Goals - 09/17/20 1616      PT LONG TERM GOAL #1   Title Pt will be independent with HEP    Time 6    Period Weeks    Status New    Target Date 10/29/20      PT  LONG TERM GOAL #2   Title Pt will improve FOTO to >=72 to demo improved functional mobility    Time 6    Period Weeks    Status New    Target Date 10/29/20      PT LONG TERM GOAL #3   Title Pt will return to yoga practice safely with no increase in symptoms    Time 6    Period Weeks    Status New    Target Date 10/29/20      PT LONG TERM GOAL #4   Title Pt will vacuum x 20 minutes with no increase in symptoms    Time 6    Period Weeks    Status New    Target Date 10/29/20                 Plan - 10/01/20 1528    Clinical Impression Statement Pt with much decreased pain since last visit. Pt with good response to advancement of exercises for core strengthening. Continues with significant mm spasticity in Lt lumbar paraspinals and responds well to dry needling    PT Next Visit Plan update HEP as pt is going to [place] for a week. Manual as indicated    PT Home Exercise Plan Access Code: [email or web address]    Consulted and Agree with Plan of Care Patient           Patient will benefit from skilled therapeutic intervention in order to improve the following deficits and impairments:     Visit Diagnosis: Chronic left-sided low  back pain without sciatica  Muscle weakness (generalized)  Other symptoms and signs involving the musculoskeletal system     Problem List Patient Active Problem List   Diagnosis Date Noted  . Elevated LDL cholesterol level 08/27/2020  . Lumbar spinal stenosis 08/25/2020  . No energy 01/20/2018  . Chronic idiopathic constipation 01/18/2018  . RLQ abdominal pain 01/18/2018  . Bronchospasm 11/15/2017  . Allergic rhinitis 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 11/15/2017  . Environmental and seasonal allergies 11/15/2017  . Encounter for adjustment or management of vascular access device 09/13/2016  . Right shoulder pain 03/02/2016  . Right knee pain 05/06/2015  . Post concussion syndrome 11/07/2013  . Abdominal pain, other specified site 10/03/2010  . POSTMENOPAUSAL SYNDROME 12/15/2009  . FINGER PAIN 10/22/2009  . PAIN IN JOINT, MULTIPLE SITES 09/28/2009  . TINNITUS 07/13/2009  . EAR PAIN, RIGHT 07/13/2009  . SOLAR KERATOSIS 03/31/2009  . SEBORRHEIC KERATOSIS 08/12/2008  . NEVUS, ATYPICAL 01/09/2008  . POSTMENOPAUSAL STATUS 12/06/2007  . Other malaise and fatigue 12/06/2007  . WARTS, MULTIPLE 09/09/2007  . SPINAL STENOSIS, CERVICAL 09/09/2007  . NEOPLASM, SKIN, UNCERTAIN BEHAVIOR 92/33/0076  Macallan Ord, PT  Alinah Sheard 10/01/2020, 3:30 PM  [facility] [place], [place], [address] Phone: [phone]   Fax:  [phone]  Name: Lynn Lloyd MRN: [id number] Date of Birth: 02-Jun-1956

## 2020-10-06 ENCOUNTER — Ambulatory Visit: Payer: BC Managed Care – PPO | Admitting: Physical Therapy

## 2020-10-06 ENCOUNTER — Other Ambulatory Visit: Payer: Self-pay

## 2020-10-06 DIAGNOSIS — R29898 Other symptoms and signs involving the musculoskeletal system: Secondary | ICD-10-CM | POA: Diagnosis not present

## 2020-10-06 DIAGNOSIS — G8929 Other chronic pain: Secondary | ICD-10-CM | POA: Diagnosis not present

## 2020-10-06 DIAGNOSIS — M6281 Muscle weakness (generalized): Secondary | ICD-10-CM

## 2020-10-06 DIAGNOSIS — M545 Low back pain, unspecified: Secondary | ICD-10-CM

## 2020-10-06 NOTE — Therapy (Signed)
[place], [place], [address] Phone: [phone]   Fax:  [phone]  Physical Therapy Treatment  Patient Details  Name: Lynn Lloyd MRN: [id number] Date of Birth: 08-12-1956 Referring Provider (PT): thekkekandam   Encounter Date: 10/06/2020   PT End of Session - 10/06/20 1641    Visit Number 4    Number of Visits 6    Date for PT Re-Evaluation 10/29/20    PT Start Time 1600    PT Stop Time 1640    PT Time Calculation (min) 40 min    Activity Tolerance Patient tolerated treatment well    Behavior During Therapy [facility] for tasks assessed/performed           Past Medical History:  Diagnosis Date  . Chronic kidney disease   . Constipation   . Endometriosis   . Hemorrhoid     Past Surgical History:  Procedure Laterality Date  . ADENOIDECTOMY    . BUNIONECTOMY  12/11  . CHOLECYSTECTOMY    . LAPAROSCOPY     endometriosis  . SALIVARY GLAND SURGERY     removal  . TONSILLECTOMY AND ADENOIDECTOMY      There were no vitals filed for this visit.   Subjective Assessment - 10/06/20 1609    Subjective Pt with increased pain this weekend down left LE. states she felt better with muscle relaxers    Patient Stated Goals get stronger    Currently in Pain? Yes    Pain Score 3     Pain Location Hip    Pain Orientation Left    Pain Descriptors / Indicators Sore                             [facility] Adult PT Treatment/Exercise - 10/06/20 0001      Lumbar Exercises: Standing   Other Standing Lumbar Exercises warrior pose x 5 with 20 sec hold bilat    Other Standing Lumbar Exercises T x 10, T with twist x 10      Lumbar Exercises: Quadruped   Opposite Arm/Leg Raise Right arm/Left leg;Left arm/Right leg;10 reps    Other Quadruped Lumbar Exercises fire hydrant x 10    Other Quadruped Lumbar Exercises bent knee raise x 10      Manual Therapy   Soft tissue mobilization STM Lt  glutes and piriformis                  PT Education - 10/06/20 1642    Education Details updated HEP, modifications to yoga poses    Person(s) Educated Patient    Methods Handout;Demonstration;Explanation    Comprehension Verbalized understanding;Returned demonstration               PT Long Term Goals - 09/17/20 1616      PT LONG TERM GOAL #1   Title Pt will be independent with HEP    Time 6    Period Weeks    Status New    Target Date 10/29/20      PT LONG TERM GOAL #2   Title Pt will improve FOTO to >=72 to demo improved functional mobility    Time 6    Period Weeks    Status New    Target Date 10/29/20      PT LONG TERM GOAL #3   Title Pt will return to yoga practice safely with no increase in symptoms  Time 6    Period Weeks    Status New    Target Date 10/29/20      PT LONG TERM GOAL #4   Title Pt will vacuum x 20 minutes with no increase in symptoms    Time 6    Period Weeks    Status New    Target Date 10/29/20                 Plan - 10/06/20 1641    Clinical Impression Statement Session focused on manual therapy as Lt piriformis with much increased spasticity. Pt responds well to manual for piriformis. Exercises updated and pt with good understanding    PT Next Visit Plan assess HEP, manual as indicated    PT Home Exercise Plan Access Code: [email or web address]    Consulted and Agree with Plan of Care Patient           Patient will benefit from skilled therapeutic intervention in order to improve the following deficits and impairments:     Visit Diagnosis: Chronic left-sided low back pain without sciatica  Muscle weakness (generalized)  Other symptoms and signs involving the musculoskeletal system     Problem List Patient Active Problem List   Diagnosis Date Noted  . Elevated LDL cholesterol level 08/27/2020  . Lumbar spinal stenosis 08/25/2020  . No energy 01/20/2018  . Chronic idiopathic constipation 01/18/2018  . RLQ  abdominal pain 01/18/2018  . Bronchospasm 11/15/2017  . Allergic rhinitis 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 11/15/2017  . Environmental and seasonal allergies 11/15/2017  . Encounter for adjustment or management of vascular access device 09/13/2016  . Right shoulder pain 03/02/2016  . Right knee pain 05/06/2015  . Post concussion syndrome 11/07/2013  . Abdominal pain, other specified site 10/03/2010  . POSTMENOPAUSAL SYNDROME 12/15/2009  . FINGER PAIN 10/22/2009  . PAIN IN JOINT, MULTIPLE SITES 09/28/2009  . TINNITUS 07/13/2009  . EAR PAIN, RIGHT 07/13/2009  . SOLAR KERATOSIS 03/31/2009  . SEBORRHEIC KERATOSIS 08/12/2008  . NEVUS, ATYPICAL 01/09/2008  . POSTMENOPAUSAL STATUS 12/06/2007  . Other malaise and fatigue 12/06/2007  . WARTS, MULTIPLE 09/09/2007  . SPINAL STENOSIS, CERVICAL 09/09/2007  . NEOPLASM, SKIN, UNCERTAIN BEHAVIOR 37/54/3606   Kwadwo Taras, PT  Kameka Whan 10/06/2020, 4:43 PM  [facility] [place], [place], [address] Phone: [phone]   Fax:  [phone]  Name: KEAIRRA BARDON MRN: [id number] Date of Birth: Jun 27, 1956

## 2020-10-06 NOTE — Patient Instructions (Signed)
Access Code: [email or web address] URL: [email or web address] Date: 10/06/2020 Prepared by: Isabelle Course  Exercises Supine Transversus Abdominis Palpation - 1 x daily - 7 x weekly - 3 sets - 10 reps Supine Transversus Abdominis Bracing with Double Leg Fallout - 1 x daily - 7 x weekly - 3 sets - 10 reps Supine Transversus Abdominis Bracing with Leg Extension - 1 x daily - 7 x weekly - 3 sets - 10 reps Piriformis Mobilization on Foam Roll - 1 x daily - 7 x weekly - 3 sets - 10 reps Quadruped Fire Hydrant - 1 x daily - 7 x weekly - 3 sets - 10 reps Quadruped Bent Leg Hip Extension - 1 x daily - 7 x weekly - 3 sets - 10 reps Bird Dog - 1 x daily - 7 x weekly - 3 sets - 10 reps Forward T - 1 x daily - 7 x weekly - 3 sets - 10 reps Warrior II - 1 x daily - 7 x weekly - 1 sets - 10 reps

## 2020-10-13 ENCOUNTER — Ambulatory Visit: Payer: BC Managed Care – PPO

## 2020-10-20 ENCOUNTER — Encounter: Payer: Self-pay | Admitting: Physical Therapy

## 2020-10-20 ENCOUNTER — Ambulatory Visit: Payer: BC Managed Care – PPO | Admitting: Physical Therapy

## 2020-10-20 ENCOUNTER — Other Ambulatory Visit: Payer: Self-pay

## 2020-10-20 DIAGNOSIS — M6281 Muscle weakness (generalized): Secondary | ICD-10-CM | POA: Diagnosis not present

## 2020-10-20 DIAGNOSIS — G8929 Other chronic pain: Secondary | ICD-10-CM | POA: Diagnosis not present

## 2020-10-20 DIAGNOSIS — R29898 Other symptoms and signs involving the musculoskeletal system: Secondary | ICD-10-CM

## 2020-10-20 DIAGNOSIS — M545 Low back pain, unspecified: Secondary | ICD-10-CM

## 2020-10-20 NOTE — Patient Instructions (Signed)
TENS UNIT: This is helpful for muscle pain and spasm.   Search and Purchase a TENS 7000 2nd edition at www.tenspros.com. It should be less than $30.     TENS unit instructions: Do not shower or bathe with the unit on Turn the unit off before removing electrodes or batteries If the electrodes lose stickiness add a drop of water to the electrodes after they are disconnected from the unit and place on plastic sheet. If you continued to have difficulty, call the TENS unit company to purchase more electrodes. Do not apply lotion on the skin area prior to use. Make sure the skin is clean and dry as this will help prolong the life of the electrodes. After use, always check skin for unusual red areas, rash or other skin difficulties. If there are any skin problems, does not apply electrodes to the same area. Never remove the electrodes from the unit by pulling the wires. Do not use the TENS unit or electrodes other than as directed. Do not change electrode placement without consultating your therapist or physician. Keep 2 fingers width between each electrode. Wear time ratio is 2:1, on to off times.    For example on for 30 minutes off for 15 minutes and then on for 30 minutes off for 15 minutes  

## 2020-10-20 NOTE — Therapy (Addendum)
[place], [place], [address] Phone: [phone]   Fax:  [phone]  Physical Therapy Treatment and Discharge   Patient Details  Name: Lynn Lloyd MRN: [id number] Date of Birth: [birth year] Referring Provider (PT): Thekkekandam   Encounter Date: 10/20/2020   PT End of Session - 10/20/20 1607    Visit Number 5    Number of Visits 6    Date for PT Re-Evaluation 10/29/20    PT Start Time 1609   pt arrived late   PT Stop Time 1700    PT Time Calculation (min) 51 min    Activity Tolerance Patient tolerated treatment well    Behavior During Therapy [facility] for tasks assessed/performed           Past Medical History:  Diagnosis Date  . Chronic kidney disease   . Constipation   . Endometriosis   . Hemorrhoid     Past Surgical History:  Procedure Laterality Date  . ADENOIDECTOMY    . BUNIONECTOMY  12/11  . CHOLECYSTECTOMY    . LAPAROSCOPY     endometriosis  . SALIVARY GLAND SURGERY     removal  . TONSILLECTOMY AND ADENOIDECTOMY      There were no vitals filed for this visit.   Subjective Assessment - 10/20/20 1615    Subjective Pt reports the 2nd DN was more painful and she had more soreness.  She returns from vacation; she did not do exercises when on vacation due to schedule but did some yoga.     Currently in Pain? Yes    Pain Score 1     Pain Location Hip    Pain Orientation Left;Posterior    Pain Descriptors / Indicators Sore    Aggravating Factors  ?    Pain Relieving Factors advil, ice              [facility] PT Assessment - 10/20/20 0001      Assessment   Medical Diagnosis spinal stenosis of lumbar region without neurogenic claudication    Referring Provider (PT) Thekkekandam      Palpation   SI assessment  Lt ASIS higher than Rt.   Pt tender at bilat ASIS            [facility] Adult PT Treatment/Exercise - 10/20/20 0001      Self-Care   Self-Care Other Self-Care  Comments    Other Self-Care Comments  pt instructed in self massage with ball to hip musculature; pt returned demo.  reviewed log roll for sit to/from supine position. Pt returned demo with cues.  Pt educated on locating ASIS in standing to assess her pelvic alignment.      Lumbar Exercises: Stretches   Passive Hamstring Stretch Right;Left;2 reps;20 seconds   supine   Piriformis Stretch Left;Right;1 rep;30 seconds   modified pigeon pose     Lumbar Exercises: Aerobic   Nustep L5: 4 min for warm up.      Lumbar Exercises: Standing   Other Standing Lumbar Exercises Warrior 1 (arms overhead) with back heel raises x 8 reps each leg.      Lumbar Exercises: Supine   Other Supine Lumbar Exercises MET correction with dowel: Rt hip flexion, Lt hamstring activation x 5 sec x 8 reps      Lumbar Exercises: Prone   Opposite Arm/Leg Raise Right arm/Left leg;Left arm/Right leg;5 reps;3 seconds      Moist Heat Therapy   Number Minutes Moist Heat 10  Minutes    Moist Heat Location Lumbar Spine      Electrical Stimulation   Electrical Stimulation Location Lt piriformis    Electrical Stimulation Action TENS    Electrical Stimulation Parameters intensity to tolerance    Electrical Stimulation Goals Pain                       PT Long Term Goals - 10/20/20 1718      PT LONG TERM GOAL #1   Title Pt will be independent with HEP    Time 6    Period Weeks    Status On-going      PT LONG TERM GOAL #2   Title Pt will improve FOTO to >=72 to demo improved functional mobility    Time 6    Period Weeks    Status On-going      PT LONG TERM GOAL #3   Title Pt will return to yoga practice safely with no increase in symptoms    Time 6    Period Weeks    Status On-going      PT LONG TERM GOAL #4   Title Pt will vacuum x 20 minutes with no increase in symptoms    Time 6    Period Weeks    Status On-going                 Plan - 10/20/20 1712    Clinical Impression Statement Pt  presents with slight pelvic asymmetry - Lt ASIS higher than Rt. Improved alignment with supine MET correction.  She tolerated all exercises well; added modified pigeon pose in sitting and discouraged this in standing.   Continued tightness/ tenderness in Lt piriformis; improved pain level after TENS and MHP at end of session.  Pt requests to hold therapy 1 wk to assess response to exercises and self care; will schedule additional visit if needed.    Rehab Potential Good    PT Frequency 1x / week    PT Duration 6 weeks    PT Treatment/Interventions Dry needling;Taping;Manual techniques;Patient/family education;Therapeutic activities;Therapeutic exercise;Neuromuscular re-education;Electrical Stimulation;Cryotherapy;Moist Heat;Iontophoresis 80m/ml Dexamethasone    PT Next Visit Plan assess goals and readiness to d/c vs renewal.    PT Home Exercise Plan Access Code: [email or web address]   Consulted and Agree with Plan of Care Patient           Patient will benefit from skilled therapeutic intervention in order to improve the following deficits and impairments:  Pain,Decreased coordination,Decreased endurance,Increased muscle spasms,Decreased strength  Visit Diagnosis: Chronic left-sided low back pain without sciatica  Muscle weakness (generalized)  Other symptoms and signs involving the musculoskeletal system     Problem List Patient Active Problem List   Diagnosis Date Noted  . Elevated LDL cholesterol level 08/27/2020  . Lumbar spinal stenosis 08/25/2020  . No energy 01/20/2018  . Chronic idiopathic constipation 01/18/2018  . RLQ abdominal pain 01/18/2018  . Bronchospasm 11/15/2017  . Allergic rhinitis 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 11/15/2017  . Environmental and seasonal allergies 11/15/2017  . Encounter for adjustment or management of vascular access device 09/13/2016  . Right shoulder pain 03/02/2016  . Right knee pain 05/06/2015  . Post concussion syndrome 11/07/2013  . Abdominal pain, other  specified site 10/03/2010  . POSTMENOPAUSAL SYNDROME 12/15/2009  . FINGER PAIN 10/22/2009  . PAIN IN JOINT, MULTIPLE SITES 09/28/2009  . TINNITUS 07/13/2009  . EAR PAIN, RIGHT 07/13/2009  . SOLAR KERATOSIS 03/31/2009  . SEBORRHEIC KERATOSIS 08/12/2008  .  NEVUS, ATYPICAL 01/09/2008  . POSTMENOPAUSAL STATUS 12/06/2007  . Other malaise and fatigue 12/06/2007  . WARTS, MULTIPLE 09/09/2007  . SPINAL STENOSIS, CERVICAL 09/09/2007  . NEOPLASM, SKIN, UNCERTAIN BEHAVIOR 37/35/7897    PHYSICAL THERAPY DISCHARGE SUMMARY  Visits from Start of Care: 5  Current functional level related to goals / functional outcomes: Continues with tightness and tenderness   Remaining deficits: See above   Education / Equipment: HEP  Plan: Patient agrees to discharge.  Patient goals were not met. Patient is being discharged due to being pleased with the current functional level.  ?????      Isabelle Course, PT  Kerin Perna, PTA 10/20/20 5:18 PM  [facility] [place], [place], [address] Phone: [phone]   Fax:  [phone]  Name: JOELINE FREER MRN: [id number] Date of Birth: [birth year]

## 2020-10-21 ENCOUNTER — Encounter: Payer: Self-pay | Admitting: Family Medicine

## 2020-10-21 ENCOUNTER — Ambulatory Visit: Payer: BC Managed Care – PPO | Admitting: Family Medicine

## 2020-10-21 VITALS — BP 120/72 | HR 66 | Temp 97.8°F | Ht 68.0 in | Wt 150.8 lb

## 2020-10-21 DIAGNOSIS — K115 Sialolithiasis: Secondary | ICD-10-CM

## 2020-10-21 MED ORDER — AMOXICILLIN 500 MG PO CAPS: 500.0000 mg | ORAL_CAPSULE | Freq: Three times a day (TID) | ORAL | 0 refills | Status: AC

## 2020-10-21 NOTE — Progress Notes (Signed)
Acute Office Visit  Subjective:    Patient ID: Lynn Lloyd, female    DOB: [birth year], 65 y.o.   MRN: [id number]  No chief complaint on file.   HPI Patient is in today for possible salivary gland stone.   Patient with history of salivary gland issues. Reports she had a stone under the right side of her tongue in the 5th grade and they were able to just pop it out; later, in the mid-1990s she began having more issues and infections with this gland and reports she had another procedure on it. Then, in 2008 she had another stone to the same gland - an ENT attempted to remove it in the office, but she could not tolerate the procedure and an oral surgeon ended up operating again.   She is now reporting about a week of discomfort and swelling under the right side of her tongue. She reports she didn't think much of it at first, but the pain has been increasing and she thinks she can feel a stone. She hasn't tried anything for symptom management, but reports she does have increased pain with eating. She has also started to notice the pain radiating into her upper neck (submandibular/tonsillar node region) and right ear, but has not noticed the swelling spreading anywhere other than the gland. She also reports a foul taste to the saliva she produces at the gland, but has not tried to visualize it to see if there is a different color to it. She did report having some chills this morning, but does not think she had a fever; she is also reporting feeling more tired the past few days, but also just got back in town from traveling.     Past Medical History:  Diagnosis Date  . Chronic kidney disease   . Constipation   . Endometriosis   . Hemorrhoid     Past Surgical History:  Procedure Laterality Date  . ADENOIDECTOMY    . BUNIONECTOMY  12/11  . CHOLECYSTECTOMY    . LAPAROSCOPY     endometriosis  . SALIVARY GLAND SURGERY     removal  . TONSILLECTOMY AND ADENOIDECTOMY      Family History   Problem Relation Age of Onset  . Thyroid disease Father   . Parkinsonism Father   . Cancer Mother        acl  . Hypertension Mother   . Thyroid disease Mother   . Asthma Mother   . COPD Mother   . Allergic rhinitis Mother     Social History   Socioeconomic History  . Marital status: Married    Spouse name: Not on file  . Number of children: Not on file  . Years of education: Not on file  . Highest education level: Not on file  Occupational History  . Occupation: Glass blower/designer    Employer: [employer]  Tobacco Use  . Smoking status: Never Smoker  . Smokeless tobacco: Never Used  Vaping Use  . Vaping Use: Never used  Substance and Sexual Activity  . Alcohol use: Yes    Alcohol/week: 2.0 standard drinks    Types: 2 Standard drinks or equivalent per week    Comment: Occ  . Drug use: No  . Sexual activity: Yes    Partners: Male    Birth control/protection: Post-menopausal  Other Topics Concern  . Not on file  Social History Narrative  . Not on file   Social Determinants of Health   Financial Resource Strain:  Not on file  Food Insecurity: Not on file  Transportation Needs: Not on file  Physical Activity: Not on file  Stress: Not on file  Social Connections: Not on file  Intimate Partner Violence: Not on file    Outpatient Medications Prior to Visit  Medication Sig Dispense Refill  . AMBULATORY NON FORMULARY MEDICATION shingrx 2 doses to prevent shingles. 2 application 0  . Biotin 1000 MCG tablet     . cholecalciferol (VITAMIN D3) 25 MCG (1000 UNIT) tablet Take 1,000 Units by mouth daily.    . Magnesium 250 MG TABS Take by mouth.    . meloxicam (MOBIC) 15 MG tablet One tab PO qAM with a meal for 2 weeks, then daily prn pain. 30 tablet 3  . Turmeric 500 MG CAPS Take 500 mg by mouth.    . vitamin C (ASCORBIC ACID) 500 MG tablet Take 500 mg by mouth daily.    . Zinc 50 MG CAPS Take by mouth.     No facility-administered medications prior to visit.     Allergies  Allergen Reactions  . Amoxicillin-Pot Clavulanate     "get really spaced out" pharmacist said was due to clavulanate  . Cauliflower [Brassica Oleracea]   . Codeine Nausea Only  . Corn-Containing Products Other (See Comments)  . Cow's Milk  [Lac Bovis] Cough and Other (See Comments)  . Gluten Meal Other (See Comments)    Bloating  . Soy Allergy Other (See Comments)    Bloating  . Latex Other (See Comments)    whelps    Review of Systems All review of systems negative except what is listed in the HPI     Objective:    Physical Exam Vitals reviewed.  Constitutional:      Appearance: Normal appearance.  HENT:     Head: Normocephalic and atraumatic.     Right Ear: Tympanic membrane normal.     Left Ear: Tympanic membrane normal.     Mouth/Throat:     Lips: Pink.     Mouth: Mucous membranes are moist.     Comments: Right submandibular gland with erythema, edema, tenderness, and increased firmness when compared to left side Neck:     Comments: Right submandibular/tonsillar region tender to palpation, but no lymphadenopathy noted Musculoskeletal:     Cervical back: Normal range of motion.  Skin:    General: Skin is warm.  Neurological:     General: No focal deficit present.     Mental Status: She is alert and oriented to person, place, and time.  Psychiatric:        Mood and Affect: Mood normal.        Behavior: Behavior normal.        Thought Content: Thought content normal.        Judgment: Judgment normal.     There were no vitals taken for this visit. Wt Readings from Last 3 Encounters:  06/07/20 150 lb (68 kg)  01/15/20 148 lb (67.1 kg)  09/04/19 146 lb (66.2 kg)    Health Maintenance Due  Topic Date Due  . COVID-19 Vaccine (3 - Pfizer risk 4-dose series) 12/25/2019    There are no preventive care reminders to display for this patient.   Lab Results  Component Value Date   TSH 3.29 08/25/2020   Lab Results  Component Value Date    WBC 7.4 07/07/2019   HGB 14.6 07/07/2019   HCT 43.5 07/07/2019   MCV 92.8 07/07/2019   PLT 253  07/07/2019   Lab Results  Component Value Date   NA 140 08/25/2020   K 4.2 08/25/2020   CO2 29 08/25/2020   GLUCOSE 87 08/25/2020   BUN 13 08/25/2020   CREATININE 0.83 08/25/2020   BILITOT 0.5 08/25/2020   ALKPHOS 64 09/11/2016   AST 19 08/25/2020   ALT 17 08/25/2020   PROT 7.1 08/25/2020   ALBUMIN 4.1 09/11/2016   CALCIUM 9.7 08/25/2020   Lab Results  Component Value Date   CHOL 237 (H) 08/25/2020   Lab Results  Component Value Date   HDL 86 08/25/2020   Lab Results  Component Value Date   LDLCALC 135 (H) 08/25/2020   Lab Results  Component Value Date   TRIG 67 08/25/2020   Lab Results  Component Value Date   CHOLHDL 2.8 08/25/2020   No results found for: HGBA1C     Assessment & Plan:   1. Salivary calculus  Although difficult to visualize due to the anatomy of her mouth, I was able to palpate the right submandibular salivary gland and felt what could be a stone (see PE description above). Given her worsening pain and now soreness radiating to her neck and ear, and description of foul tasting saliva, would be concerned for potential infection. Will give some amoxicillin and encourage supportive therapy and attempts to stimulate salivation - education attached to AVS. If she is not mostly better by Monday, she plans to let her dentist evaluate her, take x-rays, and place referral to oral surgeon who did her previous salivary calculus removal.   Educated on signs and symptoms that would require urgent evaluation. Follow-up if symptoms worsen or do not improve.  She states she is allergic to the clavulanate component of Augmentin, but has done fine with taking amoxicillin. Educated on safety.  - amoxicillin (AMOXIL) 500 MG capsule; Take 1 capsule (500 mg total) by mouth 3 (three) times daily for 7 days.  Dispense: 21 capsule; Refill: 0   Terrilyn Saver, NP

## 2020-10-21 NOTE — Patient Instructions (Signed)
Salivary Stone  A salivary stone is a mineral deposit that builds up in the ducts that drain your salivary glands. Most salivary stones are made of calcium. When a stone forms, saliva can back up into the gland and cause painful swelling. Your salivary glands are the glands that produce saliva. You have six major salivary glands. Each gland has a duct that carries saliva into your mouth. Saliva keeps your mouth moist and breaks down the food that you eat. It also helps prevent tooth decay. Two salivary glands are located just in front of your ears (parotid). The ducts for these glands open up inside your cheeks, near your back teeth. You also have two glands under your tongue (sublingual) and two glands under your jaw (submandibular). The ducts for these glands open under your tongue. A stone can form in any salivary gland. The most common place for a salivary stone to develop is in a submandibular salivary gland. What are the causes? Salivary stones may be caused by any condition that reduces the flow of saliva. It is not known why some people form stones. What increases the risk? You are more likely to develop this condition if:  You are female.  You do not drink enough water.  You smoke.  You have any of the following: ? High blood pressure. ? Gout. ? Diabetes. What are the signs or symptoms? The main sign of a salivary gland stone is sudden swelling of a salivary gland when eating. This usually happens under the jaw on one side. Other signs and symptoms may include:  Swelling of the cheek or under the tongue when eating.  Pain in the swollen area.  Trouble chewing or swallowing.  Swelling that goes down after eating. How is this diagnosed? This condition may be diagnosed based on:  Your signs and symptoms.  A physical exam. In many cases, your health care provider will be able to feel the stone in a duct inside your mouth.  Imaging studies, such  as: ? X-rays. ? Ultrasound. ? CT scan. ? MRI. You may need to see an ear, nose, and throat specialist (ENT or otolaryngologist) for diagnosis and treatment. How is this treated? Treatment for this condition depends on the size of the stone.  A small stone that is not causing symptoms may be treated with home care.  For a stone that is large enough to cause symptoms, the treatment options may include: ? Probing and widening of the duct to allow the stone to pass. ? Inserting a thin, flexible scope (endoscope) into the duct to locate and remove the stone. ? Breaking up the stone with sound waves. ? Removing the entire salivary gland. Follow these instructions at home: To relieve discomfort  Follow these instructions every few hours: ? Suck on a lemon candy to stimulate the flow of saliva. ? Put a warm compress over the gland. ? Gently massage the gland. General instructions  Drink enough fluid to keep your urine pale yellow.  Do not use any products that contain nicotine or tobacco, such as cigarettes and e-cigarettes. If you need help quitting, ask your health care provider.  Keep all follow-up visits as told by your health care provider. This is important.   Contact a health care provider if:  You have pain and swelling in your face, jaw, or mouth after eating.  You have persistent swelling in any of these places: ? In front of your ear. ? Under your jaw. ? Inside your mouth.   Get help right away if:  You have pain and swelling in your face, jaw, or mouth, and this is getting worse.  Your pain and swelling make it hard to swallow or breathe. Summary  A salivary stone is a mineral deposit that builds up in the ducts that drain your salivary glands.  When a stone forms, saliva can back up into the gland and cause painful swelling.  Salivary stones may be caused by any condition that reduces the flow of saliva.  Treatment for this condition depends on the size of the  stone. This information is not intended to replace advice given to you by your health care provider. Make sure you discuss any questions you have with your health care provider. Document Revised: 09/17/2017 Document Reviewed: 09/17/2017 Elsevier Patient Education  2021 Elsevier Inc.  

## 2020-10-25 ENCOUNTER — Encounter: Payer: Self-pay | Admitting: Physician Assistant

## 2020-10-25 DIAGNOSIS — K115 Sialolithiasis: Secondary | ICD-10-CM

## 2020-11-18 ENCOUNTER — Encounter: Payer: Self-pay | Admitting: Family Medicine

## 2020-11-18 ENCOUNTER — Telehealth (INDEPENDENT_AMBULATORY_CARE_PROVIDER_SITE_OTHER): Payer: BC Managed Care – PPO | Admitting: Family Medicine

## 2020-11-18 DIAGNOSIS — J0110 Acute frontal sinusitis, unspecified: Secondary | ICD-10-CM

## 2020-11-18 DIAGNOSIS — J22 Unspecified acute lower respiratory infection: Secondary | ICD-10-CM | POA: Diagnosis not present

## 2020-11-18 DIAGNOSIS — R059 Cough, unspecified: Secondary | ICD-10-CM

## 2020-11-18 MED ORDER — AZITHROMYCIN 250 MG PO TABS: ORAL_TABLET | ORAL | 0 refills | Status: DC

## 2020-11-18 MED ORDER — BENZONATATE 100 MG PO CAPS: 100.0000 mg | ORAL_CAPSULE | Freq: Two times a day (BID) | ORAL | 0 refills | Status: DC | PRN

## 2020-11-18 MED ORDER — PREDNISONE 20 MG PO TABS
40.0000 mg | ORAL_TABLET | Freq: Every day | ORAL | 0 refills | Status: AC
Start: 2020-11-18 — End: 2020-11-23

## 2020-11-18 NOTE — Progress Notes (Signed)
Virtual Video Visit via MyChart Note  I connected with  Lynn Lloyd on 11/18/20 at  9:10 AM EDT by the video enabled telemedicine application for MyChart, and verified that I am speaking with the correct person using two identifiers.   I introduced myself as a Designer, jewellery with the practice. We discussed the limitations of evaluation and management by telemedicine and the availability of in person appointments. The patient expressed understanding and agreed to proceed.  Participating parties in this visit include: The patient and the nurse practitioner listed.  The patient is: At home I am: In the office - Primary Care Lynn Lloyd  Subjective:    CC:  Chief Complaint  Patient presents with  . Cough    HPI: Lynn Lloyd is a 65 y.o. year old female presenting today via [place] today for cough.  Patient reports she has been feeling bad since at least Friday of last week. Symptoms originally started with a sore throat and mild cough that has progressed. She now has a productive cough with purulent sputum, dyspnea at rest and with exertion, chest tightness/sorenss headache, fatigue, mild frontal sinus pressure, and cervical lymph node tenderness and swelling. She denies any pain with inspiration, fevers, chills, GU symptoms, loss of taste/smel, body aches. She has been trying Dayquil and Nyquil with little improvement. She has had 2 negative home COVID tests since symptoms began. States she is prone to bronchitis/pneumonia and is concerned because of how bad she feels right now. States her worst symptom is the cough and says it is 11/10 on severity scale.   No known sick contacts. Pfizer COVID vaccine x2.  Past medical history, Surgical history, Family history not pertinant except as noted below, Social history, Allergies, and medications have been entered into the medical record, reviewed, and corrections made.   Review of Systems:  All review of systems negative except what is  listed in the HPI   Objective:    General:  Speaking clearly in complete sentences. Mild shortness of breath noted.   Alert and oriented x3.   Normal judgment.  Absent acute distress. Cough is productive, frequent.   Impression and Recommendations:    1. Cough 2. Acute non-recurrent frontal sinusitis 3. Lower respiratory infection (e.g., bronchitis, pneumonia, pneumonitis, pulmonitis)  Patient with symptoms and duration consistent with a viral etiology, but given the progressing severity of her symptoms would like to send in a Z-pak for her to have on hand since the weekend is approaching and our office will be closed. Educated patient on antibiotic stewardship and bacterial vs viral infections. Encouraged her to try to wait a few more days before starting the antibiotic to see if she will start noticing some improvements after continued supportive/symptom management. If no improvement within the next few days, or if symptoms worsen go ahead and take the antibiotic. Also sending in a short burst of prednisone and tessalon pearls to help with her inflammation, breathing, and cough. Encouraged other supportive measures including rest, hydration, humidifier, warm compresses, OTC analgesics, Mucinex, etc. Educated on signs and symptoms requiring further evaluation.  - benzonatate (TESSALON) 100 MG capsule; Take 1 capsule (100 mg total) by mouth 2 (two) times daily as needed for cough.  Dispense: 20 capsule; Refill: 0 - predniSONE (DELTASONE) 20 MG tablet; Take 2 tablets (40 mg total) by mouth daily with breakfast for 5 days.  Dispense: 10 tablet; Refill: 0 - azithromycin (ZITHROMAX Z-PAK) 250 MG tablet; Take 2 tablets (500 mg) on  Day 1,  followed by 1 tablet (250 mg) once daily on Days 2 through 5.  Dispense: 6 tablet; Refill: 0  (Prednisone and Z-pak flagged as allergy due to corn allergy - patient states corn causes bloating, but no other allergic/anaphylaxis responses. She has taken both of  these medications without any problems. Educated on safety).  Follow-up if symptoms worsen or fail to improve.    I discussed the assessment and treatment plan with the patient. The patient was provided an opportunity to ask questions and all were answered. The patient agreed with the plan and demonstrated an understanding of the instructions.   The patient was advised to call back or seek an in-person evaluation if the symptoms worsen or if the condition fails to improve as anticipated.  I provided 20 minutes of non-face-to-face interaction with this [place] visit including intake, same-day documentation, and chart review.   Terrilyn Saver, NP

## 2021-02-07 ENCOUNTER — Ambulatory Visit: Payer: BC Managed Care – PPO | Admitting: Physician Assistant

## 2021-02-07 ENCOUNTER — Other Ambulatory Visit: Payer: Self-pay

## 2021-02-07 VITALS — BP 142/76 | HR 72 | Temp 98.4°F | Ht 68.0 in | Wt 148.0 lb

## 2021-02-07 DIAGNOSIS — U099 Post covid-19 condition, unspecified: Secondary | ICD-10-CM | POA: Diagnosis not present

## 2021-02-07 DIAGNOSIS — R5382 Chronic fatigue, unspecified: Secondary | ICD-10-CM

## 2021-02-07 DIAGNOSIS — G9332 Myalgic encephalomyelitis/chronic fatigue syndrome: Secondary | ICD-10-CM

## 2021-02-07 DIAGNOSIS — H9201 Otalgia, right ear: Secondary | ICD-10-CM

## 2021-02-07 MED ORDER — METHYLPREDNISOLONE 4 MG PO TBPK: ORAL_TABLET | ORAL | 0 refills | Status: DC

## 2021-02-07 MED ORDER — FLUTICASONE PROPIONATE 50 MCG/ACT NA SUSP: 2.0000 | Freq: Every day | NASAL | 2 refills | Status: DC

## 2021-02-07 NOTE — Progress Notes (Signed)
Subjective:    Patient ID: Lynn Lloyd, female    DOB: [birth year], 65 y.o.   MRN: [id number]  HPI Pt is a 65 yo female on day 15 of covid and having lots of fatigue and lingering drainage, ear popping and right ear pain. She denies any current SOB, headaches, fever, chills, n/v/d. She has been taking tylenol cold sinus severe, cough drops, time. She feels better but very tired and right ear seems to be hurting more and more. She feels a lot of pressure and like her hearing is muffled.   .. Active Ambulatory Problems    Diagnosis Date Noted   WARTS, MULTIPLE 09/09/2007   NEVUS, ATYPICAL 01/09/2008   NEOPLASM, SKIN, UNCERTAIN BEHAVIOR 69/62/9528   TINNITUS 07/13/2009   Otalgia 07/13/2009   POSTMENOPAUSAL STATUS 12/06/2007   POSTMENOPAUSAL SYNDROME 12/15/2009   SOLAR KERATOSIS 03/31/2009   SEBORRHEIC KERATOSIS 08/12/2008   PAIN IN JOINT, MULTIPLE SITES 09/28/2009   SPINAL STENOSIS, CERVICAL 09/09/2007   FINGER PAIN 10/22/2009   Other malaise and fatigue 12/06/2007   Abdominal pain, other specified site 10/03/2010   Post concussion syndrome 11/07/2013   Right knee pain 05/06/2015   Right shoulder pain 03/02/2016   Encounter for adjustment or management of vascular access device 09/13/2016   Bronchospasm 11/15/2017   Allergic rhinitis 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 11/15/2017   Environmental and seasonal allergies 11/15/2017   Chronic idiopathic constipation 01/18/2018   RLQ abdominal pain 01/18/2018   No energy 01/20/2018   Lumbar spinal stenosis 08/25/2020   Elevated LDL cholesterol level 08/27/2020   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     Objective:   Physical Exam Vitals reviewed.  Constitutional:      Appearance: Normal appearance.  HENT:     Head: Normocephalic.     Comments: No sinus tenderness to palpaiton.     Right Ear: Ear canal and external ear normal. There is no impacted cerumen.     Left Ear: Tympanic membrane, ear canal and external ear normal. There is no impacted cerumen.     Ears:     Comments: Right TM non tender to pulling on tragus or auricle.  TM did have some fluid from [place] to [place] with surrounding erythema.     Nose: Nose normal.     Mouth/Throat:     Mouth: Mucous membranes are moist.  Eyes:     General:        Right eye: No discharge.        Left eye: No discharge.     Conjunctiva/sclera: Conjunctivae normal.     Pupils: Pupils are equal, round, and reactive to light.  Cardiovascular:     Rate and Rhythm: Normal rate and regular rhythm.     Pulses: Normal pulses.     Heart sounds: Normal heart sounds.  Pulmonary:     Effort: Pulmonary effort is normal.  Breath sounds: Normal breath sounds.  Musculoskeletal:     Right lower leg: No edema.     Left lower leg: No edema.     Comments: No calf tenderness.   Neurological:     General: No focal deficit present.     Mental Status: She is alert and oriented to person, place, and time.  Psychiatric:        Mood and Affect: Mood normal.          Assessment & Plan:   Marland KitchenMarland KitchenRashawnda was seen today for ear pain.  Diagnoses and all orders for this visit:  Right ear pain -     methylPREDNISolone (MEDROL DOSEPAK) 4 MG TBPK tablet; Take as directed by package insert. -     fluticasone (FLONASE) 50 MCG/ACT nasal spray; Place 2 sprays into both nostrils daily.  Post-COVID chronic  fatigue  Reassured patient no signs of infection at this time. She does have some fluid accumulating behind TM in right ear. Start flonase and medrol dose pack. Rest and hydrate. Continue to drink plenty of fluids. Any worsening symptoms or SOB please call office.   Discussed fatigue may last for many weeks after infection. Continue with symptomatic care.

## 2021-02-08 ENCOUNTER — Encounter: Payer: Self-pay | Admitting: Physician Assistant

## 2021-02-08 DIAGNOSIS — U099 Post covid-19 condition, unspecified: Secondary | ICD-10-CM | POA: Insufficient documentation

## 2021-02-08 DIAGNOSIS — G9332 Myalgic encephalomyelitis/chronic fatigue syndrome: Secondary | ICD-10-CM | POA: Insufficient documentation

## 2021-04-26 ENCOUNTER — Encounter: Payer: Self-pay | Admitting: Physician Assistant

## 2021-04-26 ENCOUNTER — Ambulatory Visit: Payer: BC Managed Care – PPO | Admitting: Physician Assistant

## 2021-04-26 VITALS — BP 149/87 | HR 66 | Ht 68.0 in | Wt 151.0 lb

## 2021-04-26 DIAGNOSIS — M48061 Spinal stenosis, lumbar region without neurogenic claudication: Secondary | ICD-10-CM | POA: Diagnosis not present

## 2021-04-26 MED ORDER — PREDNISONE 50 MG PO TABS: ORAL_TABLET | ORAL | 0 refills | Status: DC

## 2021-04-26 MED ORDER — CYCLOBENZAPRINE HCL 5 MG PO TABS: 5.0000 mg | ORAL_TABLET | Freq: Three times a day (TID) | ORAL | 0 refills | Status: DC | PRN

## 2021-04-26 NOTE — Progress Notes (Signed)
Subjective:    Patient ID: Lynn Lloyd, female    DOB: 1956/02/09, 65 y.o.   MRN: [id number]  HPI Pt is a 65 yo female with lumbar spinal stenosis and chronic intermittent pain who presents to the clinic with worsening flare of pain. It is a little different than her previous pain and worse on the right. It does radiate down lateral leg and into foot. No bowel or bladder dysfunction, saddle anesthesia, no leg weakness. Last MRI showed:   moderate to severe spinal stenosis at the L4-L5 level multifactorial from disc retrusion and facet hypertrophy.  She has had 3 weeks of low back pain since she suddenly felt pain after rolling over in bed. Mobic does not help and upsets her stomach. She did use her husbands flexeril and help with pain some.   .. Active Ambulatory Problems    Diagnosis Date Noted   WARTS, MULTIPLE 09/09/2007   NEVUS, ATYPICAL 01/09/2008   NEOPLASM, SKIN, UNCERTAIN BEHAVIOR [id number]   TINNITUS 07/13/2009   Otalgia 07/13/2009   POSTMENOPAUSAL STATUS 12/06/2007   POSTMENOPAUSAL SYNDROME 12/15/2009   SOLAR KERATOSIS 03/31/2009   SEBORRHEIC KERATOSIS 08/12/2008   PAIN IN JOINT, MULTIPLE SITES 09/28/2009   SPINAL STENOSIS, CERVICAL 09/09/2007   FINGER PAIN 10/22/2009   Other malaise and fatigue 12/06/2007   Abdominal pain, other specified site 10/03/2010   Post concussion syndrome 11/07/2013   Right knee pain 05/06/2015   Right shoulder pain 03/02/2016   Encounter for adjustment or management of vascular access device 09/13/2016   Bronchospasm 11/15/2017   Allergic rhinitis 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 11/15/2017   Environmental and seasonal allergies 11/15/2017   Chronic idiopathic constipation 01/18/2018   RLQ abdominal pain 01/18/2018   No energy 01/20/2018   Lumbar spinal stenosis 08/25/2020   Elevated LDL cholesterol level 08/27/2020   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     Objective:   Physical Exam Vitals reviewed.  Constitutional:      Appearance: Normal appearance.  HENT:     Head: Normocephalic.  Cardiovascular:     Rate and Rhythm: Normal rate and regular rhythm.     Pulses: Normal pulses.     Heart sounds: Normal heart sounds.  Pulmonary:     Effort: Pulmonary effort is normal.     Breath sounds: Normal breath sounds.  Musculoskeletal:     Right lower leg: No edema.     Left lower leg: No edema.     Comments: No pain over lumbar spine to palpation.  Lumbar paraspinal tightness.  Negative straight leg test.  Poping in back with elevation of feet.  5/5 lower extremity strength.   Neurological:     General: No focal deficit present.     Mental Status: She is alert and oriented to person, place, and time.  Psychiatric:        Mood and Affect: Mood normal.  Assessment & Plan:  Marland KitchenMarland KitchenJanayla was seen today for back pain.  Diagnoses and all orders for this visit:  Spinal stenosis of lumbar region without neurogenic claudication -     predniSONE (DELTASONE) 50 MG tablet; Take one tablet for 5 days. -     cyclobenzaprine (FLEXERIL) 5 MG tablet; Take 1 tablet (5 mg total) by mouth 3 (three) times daily as needed for muscle spasms.  Flare of chronic pain. No red flags. Prednisone burst.  Flexeril as needed.  Continue ibuprofen after prednisone finished.  Icy hot patches.  Tens unit.  Consider massages.  Continue walking and exercises.   If continues or worsens consider follow up with Dr. Darene Lamer to order epidural injections.

## 2021-04-26 NOTE — Patient Instructions (Addendum)
Tens unit.  Icy hot patch.  Massage. [phone] Follow up with Dr. Darene Lamer is having more exacerbation.

## 2021-06-21 ENCOUNTER — Other Ambulatory Visit: Payer: Self-pay

## 2021-06-21 ENCOUNTER — Ambulatory Visit: Payer: BC Managed Care – PPO | Admitting: Sports Medicine

## 2021-06-21 DIAGNOSIS — M48061 Spinal stenosis, lumbar region without neurogenic claudication: Secondary | ICD-10-CM | POA: Diagnosis not present

## 2021-06-21 MED ORDER — GABAPENTIN 100 MG PO CAPS: ORAL_CAPSULE | ORAL | 11 refills | Status: DC

## 2021-06-21 NOTE — Assessment & Plan Note (Signed)
This is a very pleasant 65 year old female, known lumbar spinal stenosis. She did well earlier in the year with conservative treatment and physical therapy, more recently she has had a flare in pain, better with flexion, worse with extension. We will go ahead and add low-dose gabapentin in an up taper, home physical therapy, return to see me in 6 weeks, we will consider addition of gabapentin up taper versus formal physical therapy with aquatic therapy as well if insufficiently better, certainly lumbar epidurals can be an option in the future though she would like to avoid anything invasive.

## 2021-06-21 NOTE — Progress Notes (Signed)
    Procedures performed today:    None.  Independent interpretation of notes and tests performed by another provider:   None.  Brief History, Exam, Impression, and Recommendations:    Lumbar spinal stenosis This is a very pleasant 65 year old female, known lumbar spinal stenosis. She did well earlier in the year with conservative treatment and physical therapy, more recently she has had a flare in pain, better with flexion, worse with extension. We will go ahead and add low-dose gabapentin in an up taper, home physical therapy, return to see me in 6 weeks, we will consider addition of gabapentin up taper versus formal physical therapy with aquatic therapy as well if insufficiently better, certainly lumbar epidurals can be an option in the future though she would like to avoid anything invasive.  Chronic process with exacerbation and pharmacologic intervention  ___________________________________________ Gwen Her. Dianah Field, M.D., ABFM., CAQSM. Primary Care and [place] Instructor of [place] of [facility] of Medicine

## 2021-08-30 ENCOUNTER — Encounter: Payer: BC Managed Care – PPO | Admitting: Physician Assistant

## 2021-09-09 ENCOUNTER — Ambulatory Visit (INDEPENDENT_AMBULATORY_CARE_PROVIDER_SITE_OTHER): Payer: BC Managed Care – PPO | Admitting: Physician Assistant

## 2021-09-09 ENCOUNTER — Other Ambulatory Visit: Payer: Self-pay

## 2021-09-09 ENCOUNTER — Encounter: Payer: Self-pay | Admitting: Physician Assistant

## 2021-09-09 VITALS — BP 122/85 | HR 82 | Ht 68.0 in | Wt 149.0 lb

## 2021-09-09 DIAGNOSIS — L918 Other hypertrophic disorders of the skin: Secondary | ICD-10-CM | POA: Diagnosis not present

## 2021-09-09 DIAGNOSIS — Z0000 Encounter for general adult medical examination without abnormal findings: Secondary | ICD-10-CM

## 2021-09-09 DIAGNOSIS — Z131 Encounter for screening for diabetes mellitus: Secondary | ICD-10-CM

## 2021-09-09 DIAGNOSIS — G8929 Other chronic pain: Secondary | ICD-10-CM

## 2021-09-09 DIAGNOSIS — Z13220 Encounter for screening for lipoid disorders: Secondary | ICD-10-CM

## 2021-09-09 DIAGNOSIS — M545 Low back pain, unspecified: Secondary | ICD-10-CM

## 2021-09-09 DIAGNOSIS — Z1329 Encounter for screening for other suspected endocrine disorder: Secondary | ICD-10-CM

## 2021-09-09 DIAGNOSIS — E559 Vitamin D deficiency, unspecified: Secondary | ICD-10-CM

## 2021-09-09 MED ORDER — CYCLOBENZAPRINE HCL 10 MG PO TABS: 10.0000 mg | ORAL_TABLET | Freq: Three times a day (TID) | ORAL | 1 refills | Status: DC | PRN

## 2021-09-09 NOTE — Patient Instructions (Addendum)
Missy-  Health Maintenance After Age 67 After age 90, you are at a higher risk for certain long-term diseases and infections as well as injuries from falls. Falls are a major cause of broken bones and head injuries in people who are older than age 59. Getting regular preventive care can help to keep you healthy and well. Preventive care includes getting regular testing and making lifestyle changes as recommended by your health care provider. Talk with your health care provider about: Which screenings and tests you should have. A screening is a test that checks for a disease when you have no symptoms. A diet and exercise plan that is right for you. What should I know about screenings and tests to prevent falls? Screening and testing are the best ways to find a health problem early. Early diagnosis and treatment give you the best chance of managing medical conditions that are common after age 31. Certain conditions and lifestyle choices may make you more likely to have a fall. Your health care provider may recommend: Regular vision checks. Poor vision and conditions such as cataracts can make you more likely to have a fall. If you wear glasses, make sure to get your prescription updated if your vision changes. Medicine review. Work with your health care provider to regularly review all of the medicines you are taking, including over-the-counter medicines. Ask your health care provider about any side effects that may make you more likely to have a fall. Tell your health care provider if any medicines that you take make you feel dizzy or sleepy. Strength and balance checks. Your health care provider may recommend certain tests to check your strength and balance while standing, walking, or changing positions. Foot health exam. Foot pain and numbness, as well as not wearing proper footwear, can make you more likely to have a fall. Screenings, including: Osteoporosis screening. Osteoporosis is a condition that  causes the bones to get weaker and break more easily. Blood pressure screening. Blood pressure changes and medicines to control blood pressure can make you feel dizzy. Depression screening. You may be more likely to have a fall if you have a fear of falling, feel depressed, or feel unable to do activities that you used to do. Alcohol use screening. Using too much alcohol can affect your balance and may make you more likely to have a fall. Follow these instructions at home: Lifestyle Do not drink alcohol if: Your health care provider tells you not to drink. If you drink alcohol: Limit how much you have to: 0-1 drink a day for women. 0-2 drinks a day for men. Know how much alcohol is in your drink. In the [place] one drink equals one 12 oz bottle of beer (355 mL), one 5 oz glass of wine (148 mL), or one 1 oz glass of hard liquor (44 mL). Do not use any products that contain nicotine or tobacco. These products include cigarettes, chewing tobacco, and vaping devices, such as e-cigarettes. If you need help quitting, ask your health care provider. Activity  Follow a regular exercise program to stay fit. This will help you maintain your balance. Ask your health care provider what types of exercise are appropriate for you. If you need a cane or walker, use it as recommended by your health care provider. Wear supportive shoes that have nonskid soles. Safety  Remove any tripping hazards, such as rugs, cords, and clutter. Install safety equipment such as grab bars in bathrooms and safety rails on stairs. Keep rooms  and walkways well-lit. General instructions Talk with your health care provider about your risks for falling. Tell your health care provider if: You fall. Be sure to tell your health care provider about all falls, even ones that seem minor. You feel dizzy, tiredness (fatigue), or off-balance. Take over-the-counter and prescription medicines only as told by your health care provider. These  include supplements. Eat a healthy diet and maintain a healthy weight. A healthy diet includes low-fat dairy products, low-fat (lean) meats, and fiber from whole grains, beans, and lots of fruits and vegetables. Stay current with your vaccines. Schedule regular health, dental, and eye exams. Summary Having a healthy lifestyle and getting preventive care can help to protect your health and wellness after age 74. Screening and testing are the best way to find a health problem early and help you avoid having a fall. Early diagnosis and treatment give you the best chance for managing medical conditions that are more common for people who are older than age 55. Falls are a major cause of broken bones and head injuries in people who are older than age 45. Take precautions to prevent a fall at home. Work with your health care provider to learn what changes you can make to improve your health and wellness and to prevent falls. This information is not intended to replace advice given to you by your health care provider. Make sure you discuss any questions you have with your health care provider. Document Revised: 12/27/2020 Document Reviewed: 12/27/2020 Elsevier Patient Education  [place].

## 2021-09-09 NOTE — Progress Notes (Signed)
Subjective:     Lynn Lloyd is a 66 y.o. female and is here for a comprehensive physical exam. The patient reports has a skin tag under breast she needs removed. Needs refill on as needed flexeril.  Social History   Socioeconomic History   Marital status: Married    Spouse name: Not on file   Number of children: Not on file   Years of education: Not on file   Highest education level: Not on file  Occupational History   Occupation: Glass blower/designer    Employer: [employer]  Tobacco Use   Smoking status: Never   Smokeless tobacco: Never  Vaping Use   Vaping Use: Never used  Substance and Sexual Activity   Alcohol use: Yes    Alcohol/week: 2.0 standard drinks    Types: 2 Standard drinks or equivalent per week    Comment: Occ   Drug use: No   Sexual activity: Yes    Partners: Male    Birth control/protection: Post-menopausal  Other Topics Concern   Not on file  Social History Narrative   Not on file   Social Determinants of Health   Financial Resource Strain: Not on file  Food Insecurity: Not on file  Transportation Needs: Not on file  Physical Activity: Not on file  Stress: Not on file  Social Connections: Not on file  Intimate Partner Violence: Not on file   Health Maintenance  Topic Date Due   COVID-19 Vaccine (3 - Pfizer risk series) 09/25/2021 (Originally 12/25/2019)   INFLUENZA VACCINE  11/18/2021 (Originally 03/21/2021)   Zoster Vaccines- Shingrix (1 of 2) 12/08/2021 (Originally 12/19/1973)   Pneumonia Vaccine 55+ Years old (1 - PCV) 09/09/2022 (Originally 12/19/1960)   TETANUS/TDAP  10/24/2021   MAMMOGRAM  09/15/2022   COLONOSCOPY (Pts 45-63yrs Insurance coverage will need to be confirmed)  02/07/2024   DEXA SCAN  Completed   Hepatitis C Screening  Completed   HPV VACCINES  Aged Out    The following portions of the patient's history were reviewed and updated as appropriate: allergies, current medications, past family history, past medical history, past  social history, past surgical history, and problem list.  Review of Systems Pertinent items noted in HPI and remainder of comprehensive ROS otherwise negative.   Objective:    BP 122/85    Pulse 82    Ht 5\' 8"  (1.727 m)    Wt 149 lb (67.6 kg)    SpO2 99%    BMI 22.66 kg/m  General appearance: alert, cooperative, and appears stated age Head: Normocephalic, without obvious abnormality, atraumatic Eyes: conjunctivae/corneas clear. PERRL, EOM's intact. Fundi benign. Ears: normal TM's and external ear canals both ears Nose: Nares normal. Septum midline. Mucosa normal. No drainage or sinus tenderness. Throat: lips, mucosa, and tongue normal; teeth and gums normal Neck: no adenopathy, no carotid bruit, no JVD, supple, symmetrical, trachea midline, and thyroid not enlarged, symmetric, no tenderness/mass/nodules Back: symmetric, no curvature. ROM normal. No CVA tenderness. Lungs: clear to auscultation bilaterally Heart: regular rate and rhythm, S1, S2 normal, no murmur, click, rub or gallop Abdomen: soft, non-tender; bowel sounds normal; no masses,  no organomegaly Extremities: extremities normal, atraumatic, no cyanosis or edema Pulses: 2+ and symmetric Skin: Skin color, texture, turgor normal. No rashes or lesions inflamed skin tag under right breast Lymph nodes: Cervical, supraclavicular, and axillary nodes normal. Neurologic: Grossly normal   .. Depression screen [facility] 2/9 09/09/2021 08/25/2020 06/07/2020 10/09/2017  Decreased Interest 0 0 0 0  Down, Depressed,  Hopeless 1 1 0 0  PHQ - 2 Score 1 1 0 0  Altered sleeping 2 - - -  Tired, decreased energy 1 - - -  Change in appetite 1 - - -  Feeling bad or failure about yourself  1 - - -  Trouble concentrating 1 - - -  Moving slowly or fidgety/restless 0 - - -  Suicidal thoughts 0 - - -  PHQ-9 Score 7 - - -   .Marland Kitchen GAD 7 : Generalized Anxiety Score 09/09/2021  Nervous, Anxious, on Edge 1  Control/stop worrying 1  Worry too much - different  things 1  Trouble relaxing 1  Restless 1  Easily annoyed or irritable 0  Afraid - awful might happen 0  Total GAD 7 Score 5     Assessment:    Healthy female exam.     Plan:  Marland KitchenMarland KitchenRonneisha was seen today for annual exam.  Diagnoses and all orders for this visit:  Routine physical examination -     TSH -     Lipid Panel w/reflex Direct LDL -     COMPLETE METABOLIC PANEL WITH GFR -     CBC with Differential/Platelet  Screening for lipid disorders -     Lipid Panel w/reflex Direct LDL  Screening for diabetes mellitus -     COMPLETE METABOLIC PANEL WITH GFR  Vitamin D insufficiency -     Vitamin D (25 hydroxy)  Thyroid disorder screen -     TSH  Skin tag  Chronic bilateral low back pain without sciatica -     cyclobenzaprine (FLEXERIL) 10 MG tablet; Take 1 tablet (10 mg total) by mouth 3 (three) times daily as needed for muscle spasms.  .. Discussed 150 minutes of exercise a week.  Encouraged vitamin D 1000 units and Calcium 1300mg  or 4 servings of dairy a day.  Fasting labs ordered today PHQ/GAD increased some but she has had quite a few life events declines any intervention Mammogram scheduled for this month Bone density UTD Colonoscopy UTD Declined shingles/flu/covid/pneumonia vaccines.   Flexeril refilled for low back pain.   Cryotherapy Procedure Note  Pre-operative Diagnosis: skin tag  Post-operative Diagnosis: skin tag  Locations: under right breast  Indications: inflamed    Procedure Details  History of allergy to iodine: no. Pacemaker? no.  Patient informed of risks (permanent scarring, infection, light or dark discoloration, bleeding, infection, weakness, numbness and recurrence of the lesion) and benefits of the procedure and verbal informed consent obtained.  The areas are treated with liquid nitrogen therapy, frozen until ice ball extended 2 mm beyond lesion, allowed to thaw, and treated again. The patient tolerated procedure well.  The  patient was instructed on post-op care, warned that there may be blister formation, redness and pain. Recommend OTC analgesia as needed for pain.  Condition: Stable  Complications: none.  Plan: 1. Instructed to keep the area dry and covered for 24-48h and clean thereafter. 2. Warning signs of infection were reviewed.   3. Recommended that the patient use OTC acetaminophen as needed for pain.     See After Visit Summary for Counseling Recommendations

## 2021-09-10 LAB — CBC WITH DIFFERENTIAL/PLATELET
Absolute Monocytes: 649 cells/uL (ref 200–950)
Basophils Absolute: 90 cells/uL (ref 0–200)
Basophils Relative: 1.3 %
Eosinophils Absolute: 117 cells/uL (ref 15–500)
Eosinophils Relative: 1.7 %
HCT: 43.2 % (ref 35.0–45.0)
Hemoglobin: 14.6 g/dL (ref 11.7–15.5)
Lymphs Abs: 2036 cells/uL (ref 850–3900)
MCH: 31.9 pg (ref 27.0–33.0)
MCHC: 33.8 g/dL (ref 32.0–36.0)
MCV: 94.5 fL (ref 80.0–100.0)
MPV: 11.1 fL (ref 7.5–12.5)
Monocytes Relative: 9.4 %
Neutro Abs: 4009 cells/uL (ref 1500–7800)
Neutrophils Relative %: 58.1 %
Platelets: 280 10*3/uL (ref 140–400)
RBC: 4.57 10*6/uL (ref 3.80–5.10)
RDW: 12.2 % (ref 11.0–15.0)
Total Lymphocyte: 29.5 %
WBC: 6.9 10*3/uL (ref 3.8–10.8)

## 2021-09-10 LAB — COMPLETE METABOLIC PANEL WITH GFR
AG Ratio: 2.2 (calc) (ref 1.0–2.5)
ALT: 14 U/L (ref 6–29)
AST: 17 U/L (ref 10–35)
Albumin: 4.7 g/dL (ref 3.6–5.1)
Alkaline phosphatase (APISO): 62 U/L (ref 37–153)
BUN: 17 mg/dL (ref 7–25)
CO2: 32 mmol/L (ref 20–32)
Calcium: 9.5 mg/dL (ref 8.6–10.4)
Chloride: 105 mmol/L (ref 98–110)
Creat: 0.82 mg/dL (ref 0.50–1.05)
Globulin: 2.1 g/dL (calc) (ref 1.9–3.7)
Glucose, Bld: 95 mg/dL (ref 65–99)
Potassium: 4.7 mmol/L (ref 3.5–5.3)
Sodium: 140 mmol/L (ref 135–146)
Total Bilirubin: 0.6 mg/dL (ref 0.2–1.2)
Total Protein: 6.8 g/dL (ref 6.1–8.1)
eGFR: 79 mL/min/{1.73_m2} (ref >=60)

## 2021-09-10 LAB — LIPID PANEL W/REFLEX DIRECT LDL
Cholesterol: 222 mg/dL — ABNORMAL HIGH (ref <200)
HDL: 89 mg/dL (ref >=50)
LDL Cholesterol (Calc): 118 mg/dL (calc) — ABNORMAL HIGH
Non-HDL Cholesterol (Calc): 133 mg/dL (calc) — ABNORMAL HIGH (ref <130)
Total CHOL/HDL Ratio: 2.5 (calc) (ref <5.0)
Triglycerides: 61 mg/dL (ref <150)

## 2021-09-10 LAB — VITAMIN D 25 HYDROXY (VIT D DEFICIENCY, FRACTURES): Vit D, 25-Hydroxy: 42 ng/mL (ref 30–100)

## 2021-09-10 LAB — TSH: TSH: 3 mIU/L (ref 0.40–4.50)

## 2021-09-12 ENCOUNTER — Other Ambulatory Visit: Payer: Self-pay | Admitting: Physician Assistant

## 2021-09-12 DIAGNOSIS — Z1231 Encounter for screening mammogram for malignant neoplasm of breast: Secondary | ICD-10-CM

## 2021-09-12 NOTE — Progress Notes (Signed)
Lynn Lloyd,   Vitamin D much better than 2 years ago. Continue to take vitamin D daily.  WBC and hemoglobin look good.  Kidney, liver, glucose look great.  Thyroid is in normal range.  HDL, good cholesterol, looks amazing.  LDL, bad cholesterol, better than 1 year ago.  Overall 10 year CV risk is 5 percent. Continue with diet and exercise management.

## 2021-09-22 ENCOUNTER — Other Ambulatory Visit: Payer: Self-pay

## 2021-09-22 ENCOUNTER — Ambulatory Visit (INDEPENDENT_AMBULATORY_CARE_PROVIDER_SITE_OTHER): Payer: BC Managed Care – PPO

## 2021-09-22 DIAGNOSIS — Z1231 Encounter for screening mammogram for malignant neoplasm of breast: Secondary | ICD-10-CM

## 2021-09-23 NOTE — Progress Notes (Signed)
Normal mammogram. Follow up in 1 year.

## 2021-09-30 ENCOUNTER — Encounter: Payer: Self-pay | Admitting: Physician Assistant

## 2021-10-11 ENCOUNTER — Other Ambulatory Visit: Payer: Self-pay

## 2021-10-11 ENCOUNTER — Ambulatory Visit: Payer: BC Managed Care – PPO | Admitting: Family Medicine

## 2021-10-11 ENCOUNTER — Encounter: Payer: Self-pay | Admitting: Family Medicine

## 2021-10-11 VITALS — BP 133/95 | HR 77 | Resp 16 | Ht 68.0 in | Wt 151.0 lb

## 2021-10-11 DIAGNOSIS — J0100 Acute maxillary sinusitis, unspecified: Secondary | ICD-10-CM | POA: Diagnosis not present

## 2021-10-11 MED ORDER — DOXYCYCLINE HYCLATE 100 MG PO TABS: 100.0000 mg | ORAL_TABLET | Freq: Two times a day (BID) | ORAL | 0 refills | Status: DC

## 2021-10-11 NOTE — Progress Notes (Signed)
Acute Office Visit  Subjective:    Patient ID: Lynn Lloyd, female    DOB: 26-Apr-1956, 66 y.o.   MRN: [id number]  Chief Complaint  Patient presents with   Sore Throat    Postnasal drainage, nasal drainage, productive cough, facial pressure/pain for 10 days.     HPI Patient is in today for sinus pressure. Postnasal drainage, ST,  nasal drainage, productive cough, facial pressure/pain for 10 days.  She is having mostly right sided maxillary facial pain and she feels like she is getting a lot of yellow mucus out of that sinus.  And when she woke up this morning she felt like she was getting worse and now she is coughing up a little bit of yellow mucus.  Has been using some herbs from was used herb shop, saline solution and size all.  Past Medical History:  Diagnosis Date   Chronic kidney disease    Constipation    Endometriosis    Hemorrhoid     Past Surgical History:  Procedure Laterality Date   ADENOIDECTOMY     BUNIONECTOMY  12/11   CHOLECYSTECTOMY     LAPAROSCOPY     endometriosis   SALIVARY GLAND SURGERY     removal   TONSILLECTOMY AND ADENOIDECTOMY      Family History  Problem Relation Age of Onset   Thyroid disease Father    Parkinsonism Father    Cancer Mother        acl   Hypertension Mother    Thyroid disease Mother    Asthma Mother    COPD Mother    Allergic rhinitis Mother     Social History   Socioeconomic History   Marital status: Married    Spouse name: Not on file   Number of children: Not on file   Years of education: Not on file   Highest education level: Not on file  Occupational History   Occupation: Glass blower/designer    Employer: [employer]   Tobacco Use   Smoking status: Never   Smokeless tobacco: Never  Vaping Use   Vaping Use: Never used  Substance and Sexual Activity   Alcohol use: Yes    Alcohol/week: 2.0 standard drinks    Types: 2 Standard drinks or equivalent per week    Comment: Occ   Drug use: No   Sexual activity:  Yes    Partners: Male    Birth control/protection: Post-menopausal  Other Topics Concern   Not on file  Social History Narrative   Not on file   Social Determinants of Health   Financial Resource Strain: Not on file  Food Insecurity: Not on file  Transportation Needs: Not on file  Physical Activity: Not on file  Stress: Not on file  Social Connections: Not on file  Intimate Partner Violence: Not on file    Outpatient Medications Prior to Visit  Medication Sig Dispense Refill   Bacillus Coagulans-Inulin (PROBIOTIC-PREBIOTIC) 1-250 BILLION-MG CAPS Take 1 capsule by mouth daily.     Biotin 1000 MCG tablet      cholecalciferol (VITAMIN D3) 25 MCG (1000 UNIT) tablet Take 5,000 Units by mouth daily.     cyclobenzaprine (FLEXERIL) 10 MG tablet Take 1 tablet (10 mg total) by mouth 3 (three) times daily as needed for muscle spasms. 90 tablet 1   Magnesium 250 MG TABS Take by mouth.     vitamin C (ASCORBIC ACID) 500 MG tablet Take 500 mg by mouth daily.     Zinc  50 MG CAPS Take by mouth.     No facility-administered medications prior to visit.    Allergies  Allergen Reactions   Amoxicillin-Pot Clavulanate     "get really spaced out" pharmacist said was due to clavulanate   Cauliflower [Brassica Oleracea]    Codeine Nausea Only   Corn-Containing Products Other (See Comments)   Cow's Milk  [Lac Bovis] Cough and Other (See Comments)   Gabapentin     Vision changes   Gluten Meal Other (See Comments)    Bloating   Soy Allergy Other (See Comments)    Bloating   Latex Other (See Comments)    whelps    Review of Systems     Objective:    Physical Exam Constitutional:      Appearance: She is well-developed.  HENT:     Head: Normocephalic and atraumatic.     Right Ear: External ear normal.     Left Ear: External ear normal.     Nose: Nose normal.  Eyes:     Conjunctiva/sclera: Conjunctivae normal.     Pupils: Pupils are equal, round, and reactive to light.  Neck:      Thyroid: No thyromegaly.  Cardiovascular:     Rate and Rhythm: Normal rate and regular rhythm.     Heart sounds: Normal heart sounds.  Pulmonary:     Effort: Pulmonary effort is normal.     Breath sounds: Normal breath sounds. No wheezing.  Musculoskeletal:     Cervical back: Neck supple.  Lymphadenopathy:     Cervical: No cervical adenopathy.  Skin:    General: Skin is warm and dry.  Neurological:     Mental Status: She is alert and oriented to person, place, and time.    BP (!) 133/95    Pulse 77    Resp 16    Ht 5' 8"  (1.727 m)    Wt 151 lb (68.5 kg)    SpO2 97%    BMI 22.96 kg/m  Wt Readings from Last 3 Encounters:  10/11/21 151 lb (68.5 kg)  09/09/21 149 lb (67.6 kg)  04/26/21 151 lb (68.5 kg)    Health Maintenance Due  Topic Date Due   COVID-19 Vaccine (3 - Pfizer risk series) 12/25/2019    There are no preventive care reminders to display for this patient.   Lab Results  Component Value Date   TSH 3.00 09/09/2021   Lab Results  Component Value Date   WBC 6.9 09/09/2021   HGB 14.6 09/09/2021   HCT 43.2 09/09/2021   MCV 94.5 09/09/2021   PLT 280 09/09/2021   Lab Results  Component Value Date   NA 140 09/09/2021   K 4.7 09/09/2021   CO2 32 09/09/2021   GLUCOSE 95 09/09/2021   BUN 17 09/09/2021   CREATININE 0.82 09/09/2021   BILITOT 0.6 09/09/2021   ALKPHOS 64 09/11/2016   AST 17 09/09/2021   ALT 14 09/09/2021   PROT 6.8 09/09/2021   ALBUMIN 4.1 09/11/2016   CALCIUM 9.5 09/09/2021   EGFR 79 09/09/2021   Lab Results  Component Value Date   CHOL 222 (H) 09/09/2021   Lab Results  Component Value Date   HDL 89 09/09/2021   Lab Results  Component Value Date   LDLCALC 118 (H) 09/09/2021   Lab Results  Component Value Date   TRIG 61 09/09/2021   Lab Results  Component Value Date   CHOLHDL 2.5 09/09/2021   No results found for: HGBA1C  Assessment & Plan:   Problem List Items Addressed This Visit   None Visit Diagnoses     Acute  non-recurrent maxillary sinusitis    -  Primary   Relevant Medications   doxycycline (VIBRA-TABS) 100 MG tablet      Acute sinusitis-d go ahead and treat with doxycycline.  Call if not better in 1 week.  Work note provided.  Okay to continue symptomatic care.  Meds ordered this encounter  Medications   doxycycline (VIBRA-TABS) 100 MG tablet    Sig: Take 1 tablet (100 mg total) by mouth 2 (two) times daily.    Dispense:  14 tablet    Refill:  0     Beatrice Lecher, MD

## 2021-11-10 ENCOUNTER — Encounter: Payer: Self-pay | Admitting: Physician Assistant

## 2021-12-26 ENCOUNTER — Ambulatory Visit: Payer: BC Managed Care – PPO | Admitting: Physician Assistant

## 2021-12-26 VITALS — BP 144/70 | HR 67 | Temp 98.3°F | Ht 68.0 in | Wt 148.0 lb

## 2021-12-26 DIAGNOSIS — R300 Dysuria: Secondary | ICD-10-CM

## 2021-12-26 DIAGNOSIS — R112 Nausea with vomiting, unspecified: Secondary | ICD-10-CM

## 2021-12-26 DIAGNOSIS — R1032 Left lower quadrant pain: Secondary | ICD-10-CM | POA: Diagnosis not present

## 2021-12-26 DIAGNOSIS — H6981 Other specified disorders of Eustachian tube, right ear: Secondary | ICD-10-CM

## 2021-12-26 DIAGNOSIS — N3000 Acute cystitis without hematuria: Secondary | ICD-10-CM | POA: Diagnosis not present

## 2021-12-26 LAB — POCT URINALYSIS DIP (CLINITEK)
Bilirubin, UA: NEGATIVE
Blood, UA: NEGATIVE
Glucose, UA: NEGATIVE mg/dL
Ketones, POC UA: NEGATIVE mg/dL
Nitrite, UA: NEGATIVE
POC PROTEIN,UA: NEGATIVE
Spec Grav, UA: <=1.005 — AB (ref 1.010–1.025)
Urobilinogen, UA: 0.2 E.U./dL
pH, UA: 5.5 (ref 5.0–8.0)

## 2021-12-26 MED ORDER — CIPROFLOXACIN HCL 500 MG PO TABS
500.0000 mg | ORAL_TABLET | Freq: Two times a day (BID) | ORAL | 0 refills | Status: AC
Start: 2021-12-26 — End: 2022-01-05

## 2021-12-26 MED ORDER — FLUTICASONE PROPIONATE 50 MCG/ACT NA SUSP: 2.0000 | Freq: Every day | NASAL | 2 refills | Status: DC

## 2021-12-26 NOTE — Progress Notes (Signed)
? ?Acute Office Visit ? ?Subjective:  ? ?  ?Patient ID: Lynn Lloyd, female    DOB: Mar 17, 1956, 66 y.o.   MRN: [id number] ? ?Chief Complaint  ?Patient presents with  ? Dysuria  ? Ear Pain  ? ? ?HPI ?Patient is in today for left lower quadrant pain, dysuria, right ear pain. She has felt "off" for the past week or so. She has been more bloated and gassy. No fever or chills. For the last 2 day she has felt really bad. She is having more left lower quadrant pain. Her stools are looser and more narrow. She has nausea but no vomiting. She feels achy. She has little appetite.  ? ?Pt has intermittent problems with ETD of ears. Her right ear hurts the worst right now. She is not doing anything to make better. No ST, cough, sOB.  ? ?.. ?Active Ambulatory Problems  ?  Diagnosis Date Noted  ? WARTS, MULTIPLE 09/09/2007  ? NEVUS, ATYPICAL 01/09/2008  ? NEOPLASM, SKIN, UNCERTAIN BEHAVIOR 81/85/6314  ? TINNITUS 07/13/2009  ? Otalgia 07/13/2009  ? POSTMENOPAUSAL STATUS 12/06/2007  ? POSTMENOPAUSAL SYNDROME 12/15/2009  ? SOLAR KERATOSIS 03/31/2009  ? SEBORRHEIC KERATOSIS 08/12/2008  ? PAIN IN JOINT, MULTIPLE SITES 09/28/2009  ? SPINAL STENOSIS, CERVICAL 09/09/2007  ? FINGER PAIN 10/22/2009  ? Other malaise and fatigue 12/06/2007  ? Abdominal pain, other specified site 10/03/2010  ? Post concussion syndrome 11/07/2013  ? Right knee pain 05/06/2015  ? Right shoulder pain 03/02/2016  ? Encounter for adjustment or management of vascular access device 09/13/2016  ? Bronchospasm 11/15/2017  ? 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 Environmental and seasonal allergies 11/15/2017  ? Chronic idiopathic constipation 01/18/2018  ? RLQ abdominal pain 01/18/2018  ? No energy 01/20/2018  ? Lumbar spinal stenosis 08/25/2020  ? Elevated LDL cholesterol level 08/27/2020  ? 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 Skin tag 09/09/2021  ? Left lower quadrant abdominal pain 12/26/2021  ? ?Resolved Ambulatory Problems  ?  Diagnosis Date Noted   ? SINUSITIS- ACUTE-NOS 11/12/2007  ? ALLERGIC RHINITIS 11/12/2007  ? Sialolithiasis 07/24/2008  ? Acute cystitis 01/16/2008  ? CERVICAL RADICULOPATHY 11/07/2006  ? ABDOMINAL BLOATING 12/15/2009  ? Dysuria 03/21/2010  ? FREQUENCY, URINARY 04/20/2010  ? Nausea alone 10/03/2010  ? Recurrent maxillary sinusitis 09/22/2016  ? Chronic bilateral low back pain without sciatica 08/27/2020  ? ?Past Medical History:  ?Diagnosis Date  ? Chronic kidney disease   ? Constipation   ? Endometriosis   ? Hemorrhoid   ? ? ? ?ROS ?See HPI.  ? ?   ?Objective:  ?  ?BP (!) 144/70   Pulse 67   Temp 98.3 ?F (36.8 ?C) (Oral)   Ht '5\' 8"'$  (1.727 m)   Wt 148 lb (67.1 kg)   SpO2 98%   BMI 22.50 kg/m?  ?BP Readings from Last 3 Encounters:  ?12/26/21 (!) 144/70  ?10/11/21 (!) 133/95  ?09/09/21 122/85  ? ?Wt Readings from Last 3 Encounters:  ?12/26/21 148 lb (67.1 kg)  ?10/11/21 151 lb (68.5 kg)  ?09/09/21 149 lb (67.6 kg)  ? ?  ? ?Physical Exam ?Vitals reviewed.  ?Constitutional:   ?   Comments: Pale appearance  ?HENT:  ?   Head: Normocephalic.  ?   Right Ear: Ear canal and external ear normal. There is no impacted cerumen.  ?   Left Ear: Ear canal and external ear normal. There is no impacted cerumen.  ?   Ears:  ?  Comments: TMs erythematous with some evidence of fluid behind TM.  ?   Nose: Congestion present.  ?   Mouth/Throat:  ?   Mouth: Mucous membranes are moist.  ?   Pharynx: Posterior oropharyngeal erythema present.  ?Eyes:  ?   Conjunctiva/sclera: Conjunctivae normal.  ?Neck:  ?   Vascular: No carotid bruit.  ?Cardiovascular:  ?   Rate and Rhythm: Normal rate and regular rhythm.  ?Pulmonary:  ?   Effort: Pulmonary effort is normal.  ?   Breath sounds: Normal breath sounds.  ?Abdominal:  ?   General: Bowel sounds are normal. There is no distension.  ?   Palpations: Abdomen is soft. There is no mass.  ?   Tenderness: There is abdominal tenderness. There is guarding. There is no right CVA tenderness, left CVA tenderness or rebound.  ?    Hernia: No hernia is present.  ?   Comments: Left lower quadrant guarding and tenderness  ?Musculoskeletal:  ?   Cervical back: Normal range of motion.  ?   Right lower leg: No edema.  ?   Left lower leg: No edema.  ?Lymphadenopathy:  ?   Cervical: No cervical adenopathy.  ?Neurological:  ?   General: No focal deficit present.  ?   Mental Status: She is alert and oriented to person, place, and time.  ?Psychiatric:     ?   Mood and Affect: Mood normal.  ? ? ?Results for orders placed or performed in visit on 12/26/21  ?POCT URINALYSIS DIP (CLINITEK)  ?Result Value Ref Range  ? Color, UA yellow yellow  ? Clarity, UA clear clear  ? Glucose, UA negative negative mg/dL  ? Bilirubin, UA negative negative  ? Ketones, POC UA negative negative mg/dL  ? Spec Grav, UA <=1.005 (A) 1.010 - 1.025  ? Blood, UA negative negative  ? pH, UA 5.5 5.0 - 8.0  ? POC PROTEIN,UA negative negative, trace  ? Urobilinogen, UA 0.2 0.2 or 1.0 E.U./dL  ? Nitrite, UA Negative Negative  ? Leukocytes, UA Small (1+) (A) Negative  ? ? ? ?   ?Assessment & Plan:  ?..Geana was seen today for dysuria and ear pain. ? ?Diagnoses and all orders for this visit: ? ?Left lower quadrant abdominal pain ?-     CT ABDOMEN PELVIS W CONTRAST; Future ?-     ciprofloxacin (CIPRO) 500 MG tablet; Take 1 tablet (500 mg total) by mouth 2 (two) times daily for 10 days. ?-     COMPLETE METABOLIC PANEL WITH GFR ?-     CBC with Differential/Platelet ?-     Lipase ? ?Dysuria ?-     POCT URINALYSIS DIP (CLINITEK) ?-     Urine Culture ? ?Nausea and vomiting, unspecified vomiting type ?-     CT ABDOMEN PELVIS W CONTRAST; Future ?-     ciprofloxacin (CIPRO) 500 MG tablet; Take 1 tablet (500 mg total) by mouth 2 (two) times daily for 10 days. ?-     COMPLETE METABOLIC PANEL WITH GFR ?-     CBC with Differential/Platelet ?-     Lipase ? ?Acute cystitis without hematuria ?-     ciprofloxacin (CIPRO) 500 MG tablet; Take 1 tablet (500 mg total) by mouth 2 (two) times daily for 10  days. ? ?ETD (Eustachian tube dysfunction), right ?-     fluticasone (FLONASE) 50 MCG/ACT nasal spray; Place 2 sprays into both nostrils daily. ? ?UA positive for small leuks. Negative for blood, protein, nitrites.  ?  Will culture.  ?Concern for diverticulitis with focused left lower quadrant tenderness, stool changes, nausea, GI issues. ?Will get CT of abdomen.  ?CBC/CMP/lipase ordered today. ?Will empirically treat for UTI with cipro and add metronidazole if CT shows diverticulitis. ?HO given for diverticulitis consideration.  ?Flonase to start for ETD. Consider adding claritin or zyrtec with D daily.  ?Afrin only as needed sparingly.  ? ? ?Iran Planas, PA-C ? ? ?

## 2021-12-26 NOTE — Patient Instructions (Signed)
Diverticulitis  Diverticulitis is infection or inflammation of small pouches (diverticula) in the colon that form due to a condition called diverticulosis. Diverticula can trap stool (feces) and bacteria, causing infection and inflammation. Diverticulitis may cause severe stomach pain and diarrhea. It may lead to tissue damage in the colon that causes bleeding or blockage. The diverticula may also burst (rupture) and cause infected stool to enter other areas of the abdomen. What are the causes? This condition is caused by stool becoming trapped in the diverticula, which allows bacteria to grow in the diverticula. This leads to inflammation and infection. What increases the risk? You are more likely to develop this condition if you have diverticulosis. The risk increases if you: Are overweight or obese. Do not get enough exercise. Drink alcohol. Use tobacco products. Eat a diet that has a lot of red meat such as beef, pork, or lamb. Eat a diet that does not include enough fiber. High-fiber foods include fruits, vegetables, beans, nuts, and whole grains. Are over 40 years of age. What are the signs or symptoms? Symptoms of this condition may include: Pain and tenderness in the abdomen. The pain is normally located on the left side of the abdomen, but it may occur in other areas. Fever and chills. Nausea. Vomiting. Cramping. Bloating. Changes in bowel routines. Blood in your stool. How is this diagnosed? This condition is diagnosed based on: Your medical history. A physical exam. Tests to make sure there is nothing else causing your condition. These tests may include: Blood tests. Urine tests. CT scan of the abdomen. How is this treated? Most cases of this condition are mild and can be treated at home. Treatment may include: Taking over-the-counter pain medicines. Following a clear liquid diet. Taking antibiotic medicines by mouth. Resting. More severe cases may need to be treated  at a hospital. Treatment may include: Not eating or drinking. Taking prescription pain medicine. Receiving antibiotic medicines through an IV. Receiving fluids and nutrition through an IV. Surgery. When your condition is under control, your health care provider may recommend that you have a colonoscopy. This is an exam to look at the entire large intestine. During the exam, a lubricated, bendable tube is inserted into the anus and then passed into the rectum, colon, and other parts of the large intestine. A colonoscopy can show how severe your diverticula are and whether something else may be causing your symptoms. Follow these instructions at home: Medicines Take over-the-counter and prescription medicines only as told by your health care provider. These include fiber supplements, probiotics, and stool softeners. If you were prescribed an antibiotic medicine, take it as told by your health care provider. Do not stop taking the antibiotic even if you start to feel better. Ask your health care provider if the medicine prescribed to you requires you to avoid driving or using machinery. Eating and drinking  Follow a full liquid diet or another diet as directed by your health care provider. After your symptoms improve, your health care provider may tell you to change your diet. He or she may recommend that you eat a diet that contains at least 25 grams (25 g) of fiber daily. Fiber makes it easier to pass stool. Healthy sources of fiber include: Berries. One cup contains 4-8 grams of fiber. Beans or lentils. One-half cup contains 5-8 grams of fiber. Green vegetables. One cup contains 4 grams of fiber. Avoid eating red meat. General instructions Do not use any products that contain nicotine or tobacco, such as   cigarettes, e-cigarettes, and chewing tobacco. If you need help quitting, ask your health care provider. Exercise for at least 30 minutes, 3 times each week. You should exercise hard enough to  raise your heart rate and break a sweat. Keep all follow-up visits as told by your health care provider. This is important. You may need to have a colonoscopy. Contact a health care provider if: Your pain does not improve. Your bowel movements do not return to normal. Get help right away if: Your pain gets worse. Your symptoms do not get better with treatment. Your symptoms suddenly get worse. You have a fever. You vomit more than one time. You have stools that are bloody, black, or tarry. Summary Diverticulitis is infection or inflammation of small pouches (diverticula) in the colon that form due to a condition called diverticulosis. Diverticula can trap stool (feces) and bacteria, causing infection and inflammation. You are at higher risk for this condition if you have diverticulosis and you eat a diet that does not include enough fiber. Most cases of this condition are mild and can be treated at home. More severe cases may need to be treated at a hospital. When your condition is under control, your health care provider may recommend that you have an exam called a colonoscopy. This exam can show how severe your diverticula are and whether something else may be causing your symptoms. Keep all follow-up visits as told by your health care provider. This is important. This information is not intended to replace advice given to you by your health care provider. Make sure you discuss any questions you have with your health care provider. Document Revised: 05/19/2019 Document Reviewed: 05/19/2019 Elsevier Patient Education  2023 [employer]  

## 2021-12-27 ENCOUNTER — Encounter: Payer: Self-pay | Admitting: Physician Assistant

## 2021-12-27 ENCOUNTER — Ambulatory Visit (INDEPENDENT_AMBULATORY_CARE_PROVIDER_SITE_OTHER): Payer: BC Managed Care – PPO

## 2021-12-27 DIAGNOSIS — R110 Nausea: Secondary | ICD-10-CM | POA: Diagnosis not present

## 2021-12-27 DIAGNOSIS — R112 Nausea with vomiting, unspecified: Secondary | ICD-10-CM

## 2021-12-27 DIAGNOSIS — R1032 Left lower quadrant pain: Secondary | ICD-10-CM | POA: Diagnosis not present

## 2021-12-27 LAB — I-STAT CREATININE (MANUAL ENTRY): Creatinine, Ser: 0.9 (ref 0.50–1.10)

## 2021-12-27 MED ORDER — IOHEXOL 300 MG/ML  SOLN
100.0000 mL | Freq: Once | INTRAMUSCULAR | Status: AC | PRN
  Administered 2021-12-27: 100 mL via INTRAVENOUS

## 2021-12-27 NOTE — Progress Notes (Signed)
GREAT news.  ?No kidney stones and kidney looks good.  ?You do have diverticulosis but no infection. Diverticulosis means you are at high risk for diverticulitis but at this time it looks like your symptoms are only from UTI. Finish cipro. Let me know if having new symptoms or not improving.

## 2021-12-29 LAB — URINE CULTURE
MICRO NUMBER:: 13367979
SPECIMEN QUALITY:: ADEQUATE

## 2021-12-29 NOTE — Progress Notes (Signed)
Sensitive to cipro. How are you feeling?

## 2022-03-23 ENCOUNTER — Other Ambulatory Visit: Payer: Self-pay | Admitting: Physician Assistant

## 2022-03-23 DIAGNOSIS — H6981 Other specified disorders of Eustachian tube, right ear: Secondary | ICD-10-CM

## 2022-04-26 ENCOUNTER — Ambulatory Visit
Admission: RE | Admit: 2022-04-26 | Discharge: 2022-04-26 | Disposition: A | Discharge status: Home / Self Care | Payer: No Typology Code available for payment source | Source: Ambulatory Visit | Attending: Family Medicine | Admitting: Family Medicine

## 2022-04-26 ENCOUNTER — Ambulatory Visit (INDEPENDENT_AMBULATORY_CARE_PROVIDER_SITE_OTHER): Payer: No Typology Code available for payment source

## 2022-04-26 VITALS — BP 134/88 | HR 73 | Temp 99.1°F | Resp 20 | Ht 68.0 in | Wt 148.0 lb

## 2022-04-26 DIAGNOSIS — R109 Unspecified abdominal pain: Secondary | ICD-10-CM

## 2022-04-26 DIAGNOSIS — R140 Abdominal distension (gaseous): Secondary | ICD-10-CM | POA: Diagnosis not present

## 2022-04-26 DIAGNOSIS — R1032 Left lower quadrant pain: Secondary | ICD-10-CM | POA: Diagnosis not present

## 2022-04-26 LAB — POCT URINALYSIS DIP (MANUAL ENTRY)
Bilirubin, UA: NEGATIVE
Blood, UA: NEGATIVE
Glucose, UA: NEGATIVE mg/dL
Ketones, POC UA: NEGATIVE mg/dL
Leukocytes, UA: NEGATIVE
Nitrite, UA: NEGATIVE
Protein Ur, POC: NEGATIVE mg/dL
Spec Grav, UA: <=1.005 — AB (ref 1.010–1.025)
Urobilinogen, UA: 0.2 E.U./dL
pH, UA: 5.5 (ref 5.0–8.0)

## 2022-04-26 MED ORDER — METRONIDAZOLE 500 MG PO TABS: ORAL_TABLET | ORAL | 0 refills | Status: DC

## 2022-04-26 MED ORDER — CIPROFLOXACIN HCL 500 MG PO TABS: ORAL_TABLET | ORAL | 0 refills | Status: DC

## 2022-04-26 NOTE — Discharge Instructions (Signed)
Begin clear liquids for about 18 to 24 hours, then may begin a BRAT diet (Bananas, Rice, Applesauce, Toast) when abdominal pain improved. Then gradually advance to a regular diet as tolerated.  Avoid milk products until well. Continue taking a probiotic while taking antibiotics. If symptoms become significantly worse during the night or over the weekend, proceed to the local emergency room.

## 2022-04-26 NOTE — ED Provider Notes (Signed)
[facility] C[facility]    CSN: [id number] Arrival date & time: 04/26/22  1734      History   Chief Complaint Chief Complaint  Patient presents with   Abdominal Pain   Bloated    HPI Lynn Lloyd is a 66 y.o. female.   Patient complains of approximately 2 week history of middle and upper abdominal pain with bloating.  She denies nausea/vomiting, changes in bowel movements, and fevers, chills, and sweats.  She has had mild urinary urgency without dysuria, frequency, or hesitancy.  About three weeks ago she experienced a mild back sprain that has improved.   The history is provided by the patient.  Abdominal Pain Pain location:  Epigastric and periumbilical Pain quality: bloating and dull   Pain radiates to:  Does not radiate Pain severity:  Mild Onset quality:  Gradual Duration:  3 weeks Timing:  Intermittent Progression:  Waxing and waning Chronicity:  New Context: not awakening from sleep, not diet changes, not eating, not laxative use, not recent illness, not recent travel, not sick contacts, not suspicious food intake and not trauma   Relieved by:  None tried Worsened by:  Nothing Ineffective treatments:  None tried Associated symptoms: no anorexia, no chest pain, no chills, no constipation, no cough, no diarrhea, no dysuria, no fatigue, no fever, no flatus, no hematemesis, no hematochezia, no hematuria, no melena, no nausea, no shortness of breath, no vaginal bleeding, no vaginal discharge and no vomiting     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   Elevated LDL cholesterol level 08/27/2020   Lumbar spinal stenosis 08/25/2020   No energy 01/20/2018   Chronic idiopathic constipation 01/18/2018   RLQ abdominal pain  01/18/2018   Bronchospasm 11/15/2017   Allergic rhinitis 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 11/15/2017   Environmental and seasonal allergies 11/15/2017   Encounter for adjustment or management of vascular access device 09/13/2016   Right shoulder pain 03/02/2016   Right knee pain 05/06/2015   Post concussion syndrome 11/07/2013   Abdominal pain, other specified site 10/03/2010   POSTMENOPAUSAL SYNDROME 12/15/2009   FINGER PAIN 10/22/2009   PAIN IN JOINT, MULTIPLE SITES 09/28/2009   TINNITUS 07/13/2009   Otalgia 07/13/2009   SOLAR KERATOSIS 03/31/2009   SEBORRHEIC KERATOSIS 08/12/2008   NEVUS, ATYPICAL 01/09/2008   POSTMENOPAUSAL STATUS 12/06/2007   Other malaise and fatigue 12/06/2007   WARTS, MULTIPLE 09/09/2007   SPINAL STENOSIS, CERVICAL 09/09/2007   NEOPLASM, SKIN, UNCERTAIN BEHAVIOR 95/04/3266    Past Surgical History:  Procedure Laterality Date   ADENOIDECTOMY     BUNIONECTOMY  12/11   CHOLECYSTECTOMY     LAPAROSCOPY     endometriosis   SALIVARY GLAND SURGERY     removal   TONSILLECTOMY AND ADENOIDECTOMY      OB History     Gravida  3   Para  3   Term  3   Preterm      AB      Living  4      SAB      IAB      Ectopic      Multiple  1   Live Births  Home Medications    Prior to Admission medications   Medication Sig Start Date End Date Taking? Authorizing Provider  ciprofloxacin (CIPRO) 500 MG tablet Take one tab PO Q12hr 04/26/22  Yes Maleena Eddleman, Ishmael Holter, MD  cyclobenzaprine (FLEXERIL) 10 MG tablet Take 1 tablet (10 mg total) by mouth 3 (three) times daily as needed for muscle spasms. 09/09/21  Yes Breeback, Jade L, PA-C  ibuprofen (ADVIL) 400 MG tablet Take 400 mg by mouth every 6 (six) hours as needed.   Yes [provider]  metroNIDAZOLE (FLAGYL) 500 MG tablet Take one tab PO Q8hr 04/26/22  Yes Manus Weedman, Ishmael Holter, MD  Bacillus Coagulans-Inulin (PROBIOTIC-PREBIOTIC) 1-250 BILLION-MG CAPS Take 1 capsule by mouth daily.    [provider]  Biotin 1000 MCG tablet  08/21/17   [provider]  cholecalciferol (VITAMIN D3) 25 MCG (1000 UNIT) tablet Take 5,000 Units by mouth daily.    [provider]  fluticasone (FLONASE) 50 MCG/ACT nasal spray SPRAY 2 SPRAYS INTO EACH NOSTRIL EVERY DAY 03/23/22   Breeback, Jade L, PA-C  Magnesium 250 MG TABS Take by mouth.    [provider]  vitamin C (ASCORBIC ACID) 500 MG tablet Take 500 mg by mouth daily.    [provider]  Zinc 50 MG CAPS Take by mouth.    [provider]    Family History Family History  Problem Relation Age of Onset   Thyroid disease Father    Parkinsonism Father    Cancer Mother        acl   Hypertension Mother    Thyroid disease Mother    Asthma Mother    COPD Mother    Allergic rhinitis Mother     Social History Social History   Tobacco Use   Smoking status: Never   Smokeless tobacco: Never  Vaping Use   Vaping Use: Never used  Substance Use Topics   Alcohol use: Not Currently    Comment: Occ   Drug use: No     Allergies   Amoxicillin-pot clavulanate, Cauliflower [brassica oleracea], Codeine, Corn-containing products, Cow's milk  [milk (cow)], Gabapentin, Gluten meal, Soy allergy, and Latex   Review of Systems Review of Systems  Constitutional:  Negative for appetite change, chills, fatigue and fever.  Respiratory:  Negative for cough and shortness of breath.   Cardiovascular:  Negative for chest pain.  Gastrointestinal:  Positive for abdominal pain. Negative for anorexia, constipation, diarrhea, flatus, hematemesis, hematochezia, melena, nausea and vomiting.  Genitourinary:  Negative for dysuria, hematuria, vaginal bleeding and vaginal discharge.  All other systems reviewed and are negative.    Physical Exam Triage Vital Signs ED Triage Vitals  Enc Vitals Group     BP 04/26/22 1748 (!) 141/94     Pulse Rate 04/26/22 1748 73     Resp 04/26/22 1748 20     Temp 04/26/22 1748 99.1  F (37.3 C)     Temp Source 04/26/22 1748 Oral     SpO2 04/26/22 1748 99 %     Weight 04/26/22 1749 148 lb (67.1 kg)     Height 04/26/22 1749 '5\' 8"'$  (1.727 m)     Head Circumference --      Peak Flow --      Pain Score 04/26/22 1748 6     Pain Loc --      Pain Edu? --      Excl. in [place]? --    No data found.  Updated Vital Signs  BP 134/88 (BP Location: Right Arm)   Pulse 73   Temp 99.1 F (37.3 C) (Oral)   Resp 20   Ht '5\' 8"'$  (1.727 m)   Wt 67.1 kg   SpO2 99%   BMI 22.50 kg/m   Visual Acuity Right Eye Distance:   Left Eye Distance:   Bilateral Distance:    Right Eye Near:   Left Eye Near:    Bilateral Near:     Physical Exam Vitals and nursing note reviewed.  Constitutional:      General: She is not in acute distress.    Appearance: She is not ill-appearing.  HENT:     Head: Normocephalic.     Mouth/Throat:     Mouth: Mucous membranes are moist.     Pharynx: Oropharynx is clear.  Eyes:     Conjunctiva/sclera: Conjunctivae normal.     Pupils: Pupils are equal, round, and reactive to light.  Cardiovascular:     Rate and Rhythm: Normal rate and regular rhythm.     Heart sounds: Normal heart sounds.  Pulmonary:     Breath sounds: Normal breath sounds.  Abdominal:     General: Bowel sounds are increased. There is no distension.     Palpations: Abdomen is soft. There is no hepatomegaly, splenomegaly or mass.     Tenderness: There is abdominal tenderness in the periumbilical area. There is no right CVA tenderness, left CVA tenderness or rebound. Negative signs include McBurney's sign, psoas sign and obturator sign.     Hernia: There is no hernia in the umbilical area or ventral area.    Musculoskeletal:     Cervical back: Neck supple.     Right lower leg: No edema.     Left lower leg: No edema.  Lymphadenopathy:     Cervical: No cervical adenopathy.  Skin:    General: Skin is warm and dry.     Findings: No rash.  Neurological:     Mental Status: She is  alert and oriented to person, place, and time.      UC Treatments / Results  Labs (all labs ordered are listed, but only abnormal results are displayed) Labs Reviewed  POCT URINALYSIS DIP (MANUAL ENTRY) - Abnormal; Notable for the following components:      Result Value   Color, UA light yellow (*)    Spec Grav, UA <=1.005 (*)    All other components within normal limits    EKG   Radiology DG Abd 2 Views  Result Date: 04/26/2022 CLINICAL DATA:  Abdominal cramping and bloating for several weeks. EXAM: ABDOMEN - 2 VIEW COMPARISON:  None Available. FINDINGS: Normal bowel gas pattern. No significant increase in the colonic stool burden. Clips are upper quadrant consistent with cholecystectomy. No evidence of renal or ureteral stones. Soft tissues are otherwise unremarkable. Clear lung bases. No acute or significant skeletal abnormality. IMPRESSION: Negative. Electronically Signed   By: Lajean Manes M.D.   On: 04/26/2022 18:55    Procedures Procedures (including critical care time)  Medications Ordered in UC Medications - No data to display  Initial Impression / Assessment and Plan / UC Course  I have reviewed the triage vital signs and the nursing notes.  Pertinent labs & imaging results that were available during my care of the patient were reviewed by me and considered in my medical decision making (see chart for details).  Note low grade fever today.  Because of past history of diverticulosis seen on CT scan abdomen/pelvis  done 12/27/21, will treat as a sub-clinical diverticulitis.  Begin Cipro and Flagyl for 5 days.  Followup with Family Doctor if not improved in one week.   Final Clinical Impressions(s) / UC Diagnoses   Final diagnoses:  Left lower quadrant abdominal pain     Discharge Instructions      Begin clear liquids for about 18 to 24 hours, then may begin a BRAT diet (Bananas, Rice, Applesauce, Toast) when abdominal pain improved. Then gradually advance to a  regular diet as tolerated.  Avoid milk products until well. Continue taking a probiotic while taking antibiotics. If symptoms become significantly worse during the night or over the weekend, proceed to the local emergency room.      ED Prescriptions     Medication Sig Dispense Auth. Provider   ciprofloxacin (CIPRO) 500 MG tablet Take one tab PO Q12hr 10 tablet Kandra Nicolas, MD   metroNIDAZOLE (FLAGYL) 500 MG tablet Take one tab PO Q8hr 15 tablet Kandra Nicolas, MD         Kandra Nicolas, MD 04/28/22 [phone]

## 2022-04-26 NOTE — ED Triage Notes (Signed)
Pt presents to Urgent Care with c/o middle, upper abdominal pain x approx 3 weeks. Denies n/v. Afebrile. Also reports recent episode of back pain. Reports urinary frequency, denies dysuria.

## 2022-05-15 DIAGNOSIS — Z008 Encounter for other general examination: Secondary | ICD-10-CM | POA: Diagnosis not present

## 2022-05-15 DIAGNOSIS — N182 Chronic kidney disease, stage 2 (mild): Secondary | ICD-10-CM | POA: Diagnosis not present

## 2022-05-15 DIAGNOSIS — M545 Low back pain, unspecified: Secondary | ICD-10-CM | POA: Diagnosis not present

## 2022-05-15 DIAGNOSIS — Z6822 Body mass index (BMI) 22.0-22.9, adult: Secondary | ICD-10-CM | POA: Diagnosis not present

## 2022-05-26 ENCOUNTER — Ambulatory Visit
Admission: RE | Admit: 2022-05-26 | Discharge: 2022-05-26 | Disposition: A | Discharge status: Home / Self Care | Payer: No Typology Code available for payment source | Source: Ambulatory Visit | Attending: Family Medicine | Admitting: Family Medicine

## 2022-05-26 VITALS — BP 124/82 | HR 74 | Temp 98.0°F | Resp 18

## 2022-05-26 DIAGNOSIS — N3001 Acute cystitis with hematuria: Secondary | ICD-10-CM

## 2022-05-26 DIAGNOSIS — R300 Dysuria: Secondary | ICD-10-CM

## 2022-05-26 LAB — POCT URINALYSIS DIP (MANUAL ENTRY)
Bilirubin, UA: NEGATIVE
Glucose, UA: 100 mg/dL — AB
Ketones, POC UA: NEGATIVE mg/dL
Nitrite, UA: POSITIVE — AB
Protein Ur, POC: 100 mg/dL — AB
Spec Grav, UA: 1.01 (ref 1.010–1.025)
Urobilinogen, UA: 2 E.U./dL — AB
pH, UA: 5 (ref 5.0–8.0)

## 2022-05-26 MED ORDER — SULFAMETHOXAZOLE-TRIMETHOPRIM 800-160 MG PO TABS: 1.0000 | ORAL_TABLET | Freq: Two times a day (BID) | ORAL | 0 refills | Status: AC

## 2022-05-26 NOTE — ED Triage Notes (Signed)
Pt c/o dysuria since yesterday. Some lower back pain and chills this am. AZO prn.

## 2022-05-26 NOTE — ED Provider Notes (Signed)
[name]    CSN: [id number] Arrival date & time: 05/26/22  0919      History   Chief Complaint Chief Complaint  Patient presents with   Dysuria   Chills    HPI Lynn Lloyd is a 66 y.o. female.   HPI Pleasant 66 year old female presents with dysuria since yesterday.  Additionally reports some lower back pain with chills that began earlier this morning.  PMH significant for endometriosis, chronic idiopathic constipation, and CKD.  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   Elevated LDL cholesterol level 08/27/2020   Lumbar spinal stenosis 08/25/2020   No energy 01/20/2018   Chronic idiopathic constipation 01/18/2018   RLQ abdominal pain 01/18/2018   Bronchospasm 11/15/2017   Allergic rhinitis 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 11/15/2017   Environmental and seasonal allergies 11/15/2017   Encounter for adjustment or management of vascular access device 09/13/2016   Right shoulder pain 03/02/2016   Right knee pain 05/06/2015   Post concussion syndrome 11/07/2013   Abdominal pain, other specified site 10/03/2010   POSTMENOPAUSAL SYNDROME 12/15/2009   FINGER PAIN 10/22/2009   PAIN IN JOINT, MULTIPLE SITES 09/28/2009   TINNITUS 07/13/2009   Otalgia 07/13/2009   SOLAR KERATOSIS 03/31/2009   SEBORRHEIC KERATOSIS 08/12/2008   NEVUS, ATYPICAL 01/09/2008   POSTMENOPAUSAL STATUS 12/06/2007   Other malaise and fatigue 12/06/2007   WARTS, MULTIPLE 09/09/2007   SPINAL STENOSIS, CERVICAL 09/09/2007   NEOPLASM, SKIN, UNCERTAIN BEHAVIOR 51/70/0174    Past Surgical History:  Procedure Laterality Date   ADENOIDECTOMY     BUNIONECTOMY  12/11   CHOLECYSTECTOMY     LAPAROSCOPY     endometriosis   SALIVARY GLAND SURGERY     removal    TONSILLECTOMY AND ADENOIDECTOMY      OB History     Gravida  3   Para  3   Term  3   Preterm      AB      Living  4      SAB      IAB      Ectopic      Multiple  1   Live Births               Home Medications    Prior to Admission medications   Medication Sig Start Date End Date Taking? Authorizing Provider  sulfamethoxazole-trimethoprim (BACTRIM DS) 800-160 MG tablet Take 1 tablet by mouth 2 (two) times daily for 7 days. 05/26/22 06/02/22 Yes Eliezer Lofts, FNP  Bacillus Coagulans-Inulin (PROBIOTIC-PREBIOTIC) 1-250 BILLION-MG CAPS Take 1 capsule by mouth daily.    [provider]  Biotin 1000 MCG tablet  08/21/17   [provider]  cholecalciferol (VITAMIN D3) 25 MCG (1000 UNIT) tablet Take 5,000 Units by mouth daily.    [provider]  cyclobenzaprine (FLEXERIL) 10 MG tablet Take 1 tablet (10 mg total) by mouth 3 (three) times daily as needed for muscle spasms. 09/09/21   Breeback, Jade L, PA-C  fluticasone (FLONASE) 50 MCG/ACT nasal spray SPRAY 2 SPRAYS INTO EACH NOSTRIL EVERY DAY 03/23/22   Breeback, Jade L, PA-C  ibuprofen (ADVIL) 400 MG tablet Take 400 mg by mouth every 6 (six) hours as needed.  [provider]  Magnesium 250 MG TABS Take by mouth.    [provider]  metroNIDAZOLE (FLAGYL) 500 MG tablet Take one tab PO Q8hr 04/26/22   Kandra Nicolas, MD  vitamin C (ASCORBIC ACID) 500 MG tablet Take 500 mg by mouth daily.    [provider]  Zinc 50 MG CAPS Take by mouth.    [provider]    Family History Family History  Problem Relation Age of Onset   Thyroid disease Father    Parkinsonism Father    Cancer Mother        acl   Hypertension Mother    Thyroid disease Mother    Asthma Mother    COPD Mother    Allergic rhinitis Mother     Social History Social History   Tobacco Use   Smoking status: Never   Smokeless tobacco: Never  Vaping Use   Vaping Use: Never used  Substance  Use Topics   Alcohol use: Not Currently    Comment: Occ   Drug use: No     Allergies   Amoxicillin-pot clavulanate, Cauliflower [brassica oleracea], Codeine, Corn-containing products, Cow's milk  [milk (cow)], Gabapentin, Gluten meal, Soy allergy, and Latex   Review of Systems Review of Systems  Genitourinary:  Positive for dysuria.     Physical Exam Triage Vital Signs ED Triage Vitals  Enc Vitals Group     BP 05/26/22 0928 124/82     Pulse Rate 05/26/22 0928 74     Resp 05/26/22 0928 18     Temp 05/26/22 0928 98 F (36.7 C)     Temp Source 05/26/22 0928 Oral     SpO2 05/26/22 0928 99 %     Weight --      Height --      Head Circumference --      Peak Flow --      Pain Score 05/26/22 0929 1     Pain Loc --      Pain Edu? --      Excl. in [place]? --    No data found.  Updated Vital Signs BP 124/82 (BP Location: Right Arm)   Pulse 74   Temp 98 F (36.7 C) (Oral)   Resp 18   SpO2 99%     Physical Exam Vitals and nursing note reviewed.  Constitutional:      Appearance: Normal appearance. She is normal weight.  HENT:     Head: Normocephalic and atraumatic.     Mouth/Throat:     Mouth: Mucous membranes are moist.     Pharynx: Oropharynx is clear.  Eyes:     Extraocular Movements: Extraocular movements intact.     Conjunctiva/sclera: Conjunctivae normal.     Pupils: Pupils are equal, round, and reactive to light.  Cardiovascular:     Rate and Rhythm: Normal rate and regular rhythm.     Pulses: Normal pulses.     Heart sounds: Normal heart sounds.  Pulmonary:     Effort: Pulmonary effort is normal.     Breath sounds: Normal breath sounds. No wheezing, rhonchi or rales.  Abdominal:     Tenderness: There is no right CVA tenderness or left CVA tenderness.  Musculoskeletal:        General: Normal range of motion.     Cervical back: Normal range of motion and neck supple.  Skin:    General: Skin is warm and dry.  Neurological:     General: No focal  deficit  present.     Mental Status: She is alert and oriented to person, place, and time. Mental status is at baseline.      UC Treatments / Results  Labs (all labs ordered are listed, but only abnormal results are displayed) Labs Reviewed  POCT URINALYSIS DIP (MANUAL ENTRY) - Abnormal; Notable for the following components:      Result Value   Color, UA orange (*)    Glucose, UA =100 (*)    Blood, UA moderate (*)    Protein Ur, POC =100 (*)    Urobilinogen, UA 2.0 (*)    Nitrite, UA Positive (*)    Leukocytes, UA Large (3+) (*)    All other components within normal limits  URINE CULTURE    EKG   Radiology No results found.  Procedures Procedures (including critical care time)  Medications Ordered in UC Medications - No data to display  Initial Impression / Assessment and Plan / UC Course  I have reviewed the triage vital signs and the nursing notes.  Pertinent labs & imaging results that were available during my care of the patient were reviewed by me and considered in my medical decision making (see chart for details).     MDM: 1.  Acute cystitis with hematuria-Rx'd Bactrim. Advised patient to take medication as directed with food to completion.  Encouraged patient increase daily water intake to 64 ounces per day while taking this medication.  Advised if symptoms worsen and/or unresolved please follow-up with PCP or here for further evaluation.  Advised we will follow-up with urine culture results once received.  Patient discharged home, hemodynamically stable.  Final Clinical Impressions(s) / UC Diagnoses   Final diagnoses:  Dysuria  Acute cystitis with hematuria     Discharge Instructions      Advised patient to take medication as directed with food to completion.  Encouraged patient increase daily water intake to 64 ounces per day while taking this medication.  Advised if symptoms worsen and/or unresolved please follow-up with PCP or here for further evaluation.   Advised we will follow-up with urine culture results once received.     ED Prescriptions     Medication Sig Dispense Auth. Provider   sulfamethoxazole-trimethoprim (BACTRIM DS) 800-160 MG tablet Take 1 tablet by mouth 2 (two) times daily for 7 days. 14 tablet Eliezer Lofts, FNP      PDMP not reviewed this encounter.   Eliezer Lofts, [place] 05/26/22 1037

## 2022-05-26 NOTE — Discharge Instructions (Addendum)
Advised patient to take medication as directed with food to completion.  Encouraged patient increase daily water intake to 64 ounces per day while taking this medication.  Advised if symptoms worsen and/or unresolved please follow-up with PCP or here for further evaluation.  Advised we will follow-up with urine culture results once received.

## 2022-05-28 LAB — URINE CULTURE: Culture: 80000 — AB

## 2022-06-21 IMAGING — MG MM DIGITAL SCREENING BILAT W/ TOMO AND CAD
8 series · 9 of 24 positions shown · non-contrast
Comparison: Previous exam(s).

CLINICAL DATA: Screening.

EXAM:
DIGITAL SCREENING BILATERAL MAMMOGRAM WITH TOMOSYNTHESIS AND CAD
TECHNIQUE: Bilateral screening digital craniocaudal and mediolateral oblique
mammograms were obtained. Bilateral screening digital breast
tomosynthesis was performed. The images were evaluated with
computer-aided detection.

[L MLO synth-2D]
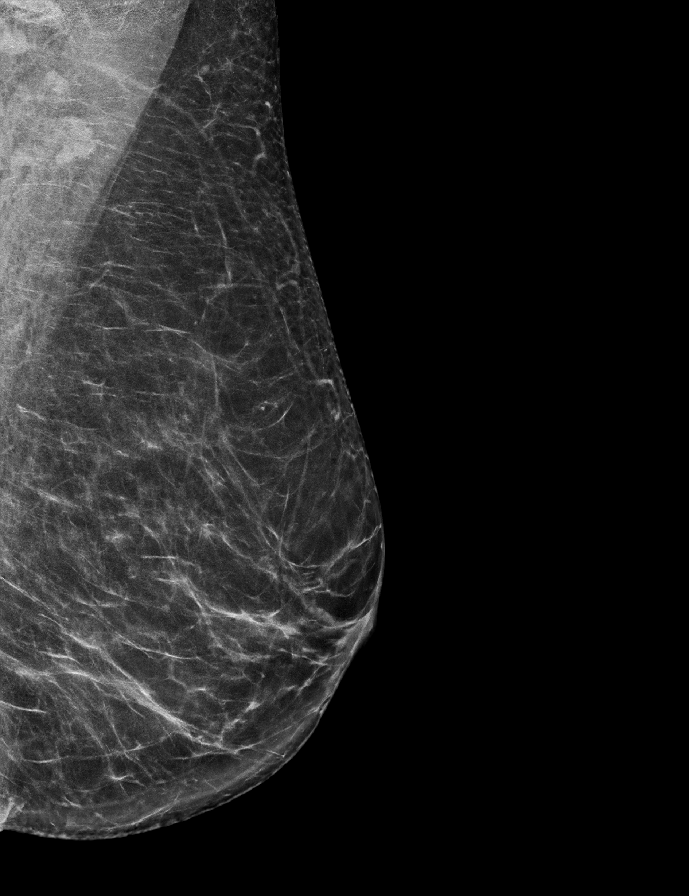

[R CC synth-2D]
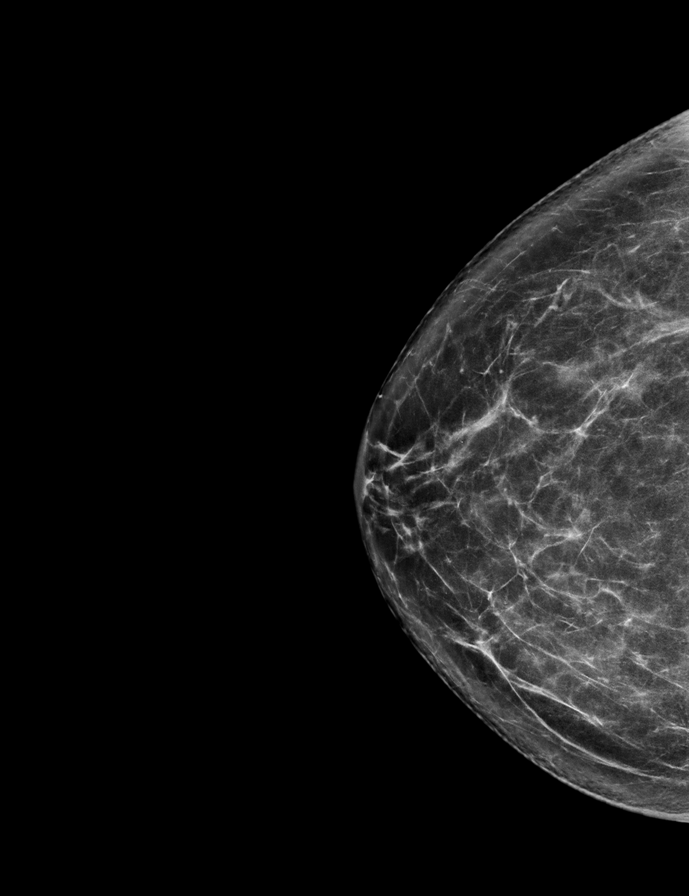

[R MLO synth-2D]
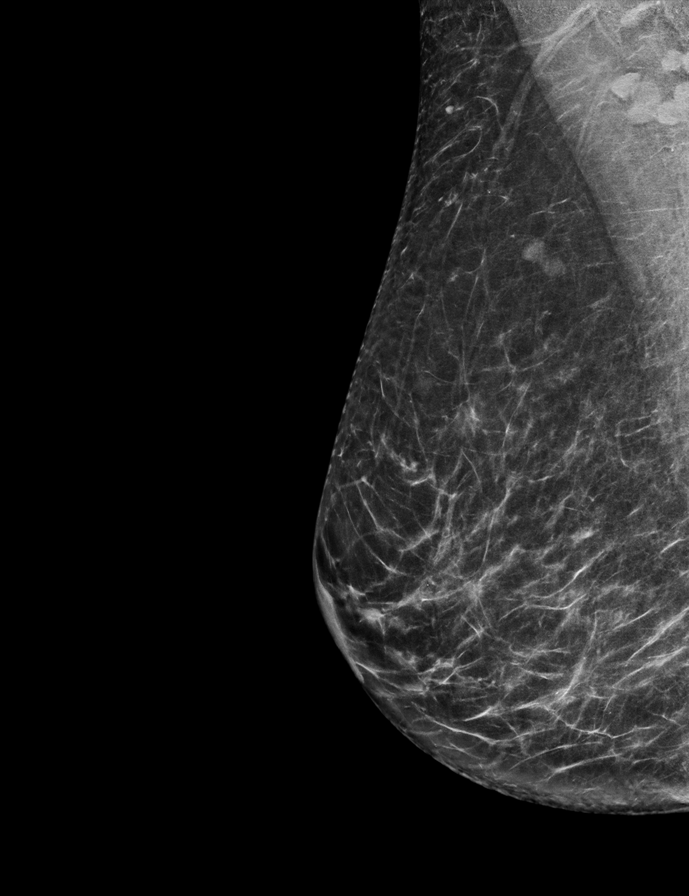

[L CC synth-2D]
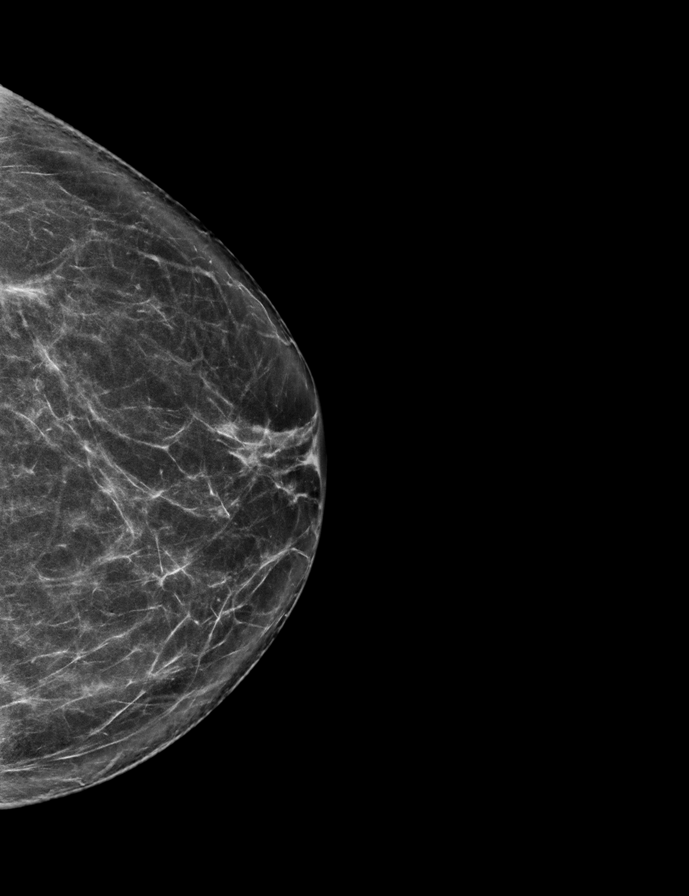

[R MLO tomo · 2 of 70 frames shown]
[frame 23/70]
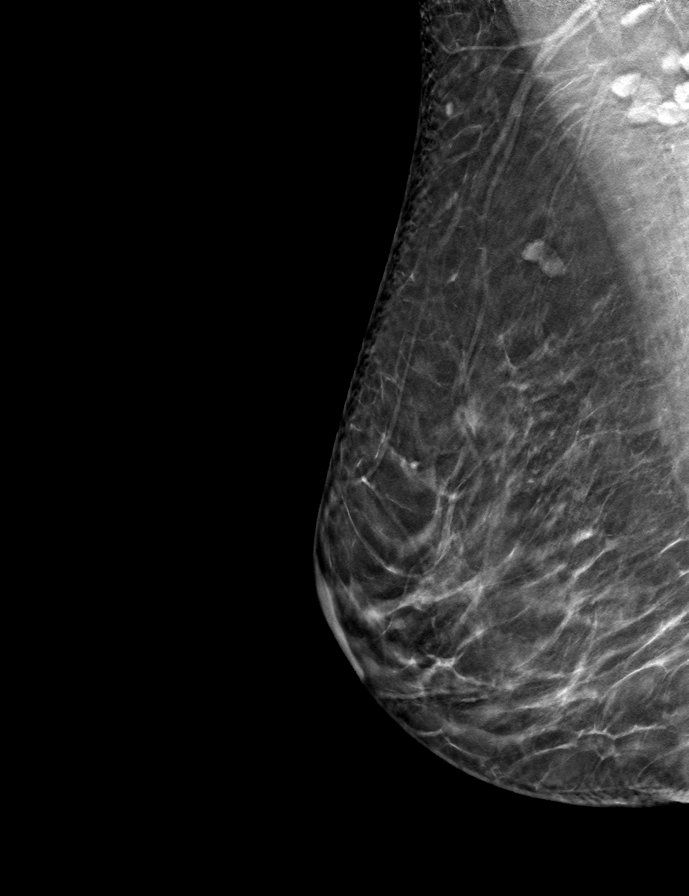
[frame 35/70]
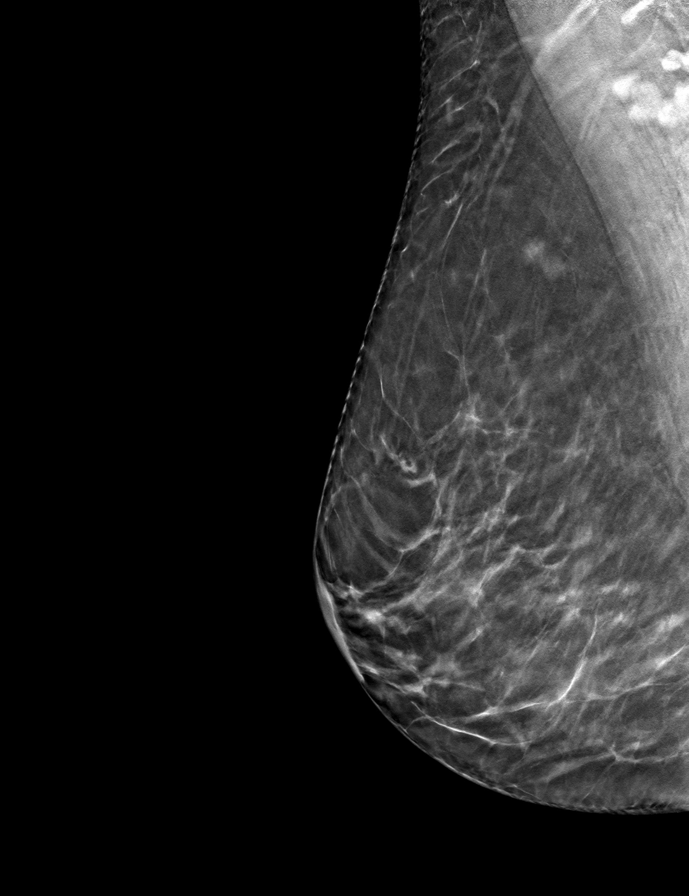

[L MLO tomo · tomo slice 37/72.0]
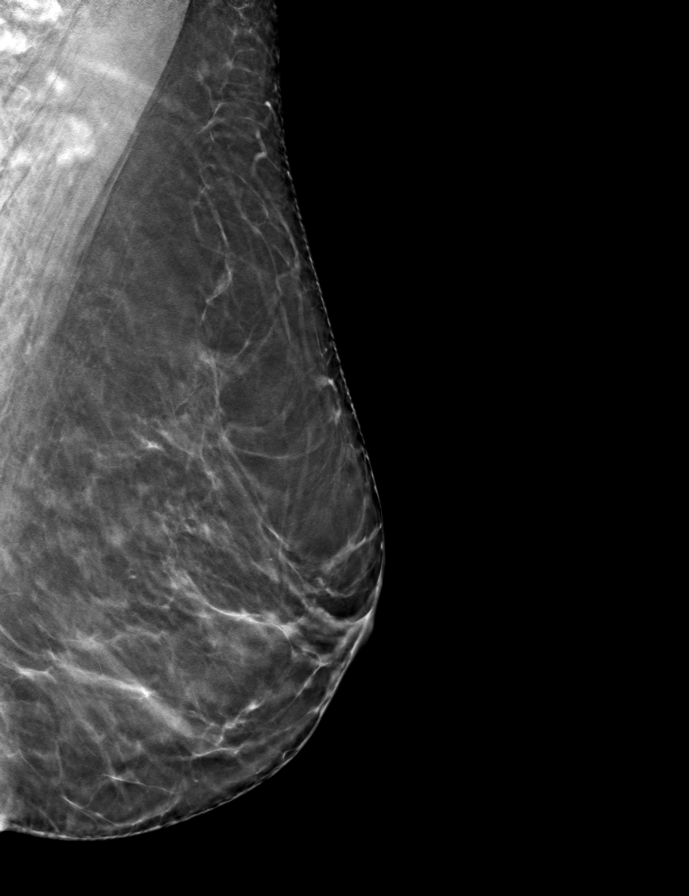

[R CC tomo · tomo slice 37/73.0]
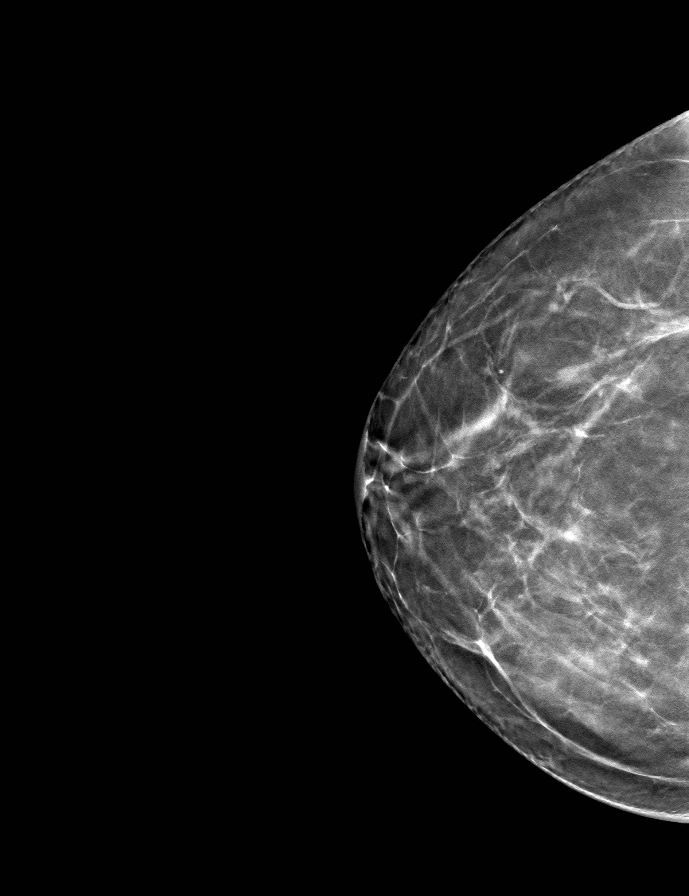

[L CC tomo · tomo slice 35/70.0]
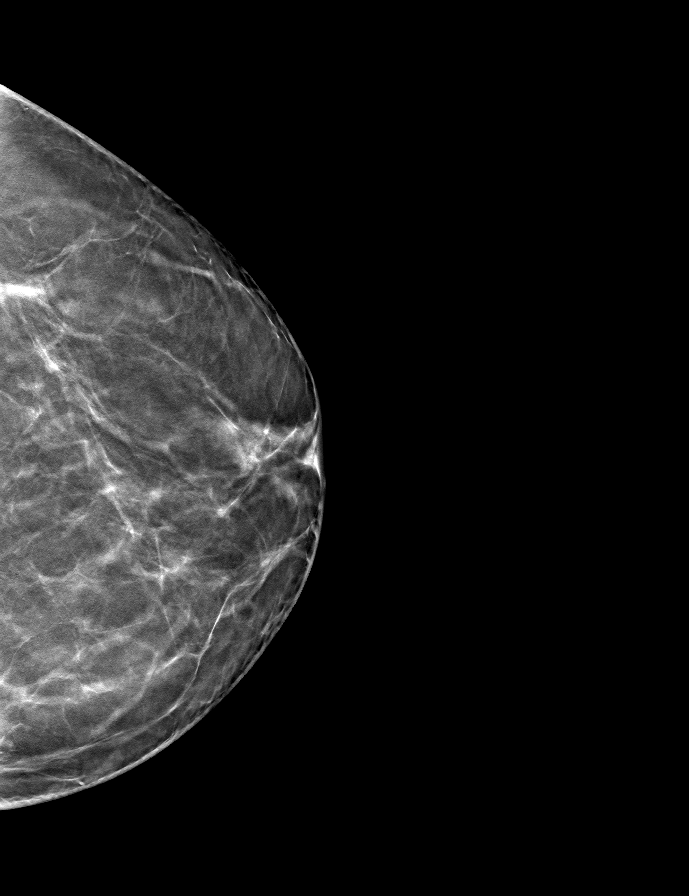

[9 of 24 positions shown; findings below may reference images not displayed]

ACR Breast Density Category b: There are scattered areas of
fibroglandular density.
FINDINGS: There are no findings suspicious for malignancy.
IMPRESSION: No mammographic evidence of malignancy. A result letter of this
screening mammogram will be mailed directly to the patient.

RECOMMENDATION:
Screening mammogram in one year. (Code:51-O-LD2)

BI-RADS CATEGORY  1: Negative.

## 2022-07-10 DIAGNOSIS — H5203 Hypermetropia, bilateral: Secondary | ICD-10-CM | POA: Diagnosis not present

## 2022-07-10 DIAGNOSIS — H524 Presbyopia: Secondary | ICD-10-CM | POA: Diagnosis not present

## 2022-07-18 ENCOUNTER — Telehealth: Payer: No Typology Code available for payment source | Admitting: Physician Assistant

## 2022-07-18 DIAGNOSIS — R3989 Other symptoms and signs involving the genitourinary system: Secondary | ICD-10-CM

## 2022-07-18 MED ORDER — CEPHALEXIN 500 MG PO CAPS: 500.0000 mg | ORAL_CAPSULE | Freq: Two times a day (BID) | ORAL | 0 refills | Status: AC

## 2022-07-18 NOTE — Patient Instructions (Signed)
  Lynn Lloyd, thank you for joining Leeanne Rio, PA-C for today's virtual visit.  While this provider is not your primary care provider (PCP), if your PCP is located in our provider database this encounter information will be shared with them immediately following your visit.   [facility] account gives you access to today's visit and all your visits, tests, and labs performed at [facility] " click here if you don't have a [facility] account or go to [email or web address]  Consent: (Patient) Lynn Lloyd provided verbal consent for this virtual visit at the beginning of the encounter.  Current Medications:  Current Outpatient Medications:    Bacillus Coagulans-Inulin (PROBIOTIC-PREBIOTIC) 1-250 BILLION-MG CAPS, Take 1 capsule by mouth daily., Disp: , Rfl:    Biotin 1000 MCG tablet, , Disp: , Rfl:    cholecalciferol (VITAMIN D3) 25 MCG (1000 UNIT) tablet, Take 5,000 Units by mouth daily., Disp: , Rfl:    cyclobenzaprine (FLEXERIL) 10 MG tablet, Take 1 tablet (10 mg total) by mouth 3 (three) times daily as needed for muscle spasms., Disp: 90 tablet, Rfl: 1   fluticasone (FLONASE) 50 MCG/ACT nasal spray, SPRAY 2 SPRAYS INTO EACH NOSTRIL EVERY DAY, Disp: 48 mL, Rfl: 1   ibuprofen (ADVIL) 400 MG tablet, Take 400 mg by mouth every 6 (six) hours as needed., Disp: , Rfl:    Magnesium 250 MG TABS, Take by mouth., Disp: , Rfl:    metroNIDAZOLE (FLAGYL) 500 MG tablet, Take one tab PO Q8hr, Disp: 15 tablet, Rfl: 0   vitamin C (ASCORBIC ACID) 500 MG tablet, Take 500 mg by mouth daily., Disp: , Rfl:    Zinc 50 MG CAPS, Take by mouth., Disp: , Rfl:    Medications ordered in this encounter:  No orders of the defined types were placed in this encounter.    *If you need refills on other medications prior to your next appointment, please contact your pharmacy*  Follow-Up: Call back or seek an in-person evaluation if the symptoms worsen or if the condition  fails to improve as anticipated.  [place] [phone]  Other Instructions Take antibiotic as directed. Increase fluids and rest. Symptoms should begin improving over next 24-48 hours and should continue until resolved. If not, or any new/worsening symptoms, please seek an in-person evaluation ASAP. DO NOT DELAY CARE.   If you have been instructed to have an in-person evaluation today at a local Urgent Care facility, please use the link below. It will take you to a list of all of our available [facility], including address, phone number and hours of operation. Please do not delay care.  [facility]  If you or a family member do not have a primary care provider, use the link below to schedule a visit and establish care. When you choose a [facility] primary care physician or advanced practice provider, you gain a long-term partner in health. Find a Primary Care Provider  Learn more about [place]'s in-office and virtual care options: [facility]

## 2022-07-18 NOTE — Progress Notes (Signed)
Virtual Visit Consent   Lynn Lloyd, you are scheduled for a virtual visit with a [facility] provider today. Just as with appointments in the office, your consent must be obtained to participate. Your consent will be active for this visit and any virtual visit you may have with one of our providers in the next 365 days. If you have a MyChart account, a copy of this consent can be sent to you electronically.  As this is a virtual visit, video technology does not allow for your provider to perform a traditional examination. This may limit your provider's ability to fully assess your condition. If your provider identifies any concerns that need to be evaluated in person or the need to arrange testing (such as labs, EKG, etc.), we will make arrangements to do so. Although advances in technology are sophisticated, we cannot ensure that it will always work on either your end or our end. If the connection with a video visit is poor, the visit may have to be switched to a telephone visit. With either a video or telephone visit, we are not always able to ensure that we have a secure connection.  By engaging in this virtual visit, you consent to the provision of healthcare and authorize for your insurance to be billed (if applicable) for the services provided during this visit. Depending on your insurance coverage, you may receive a charge related to this service.  I need to obtain your verbal consent now. Are you willing to proceed with your visit today? Lynn Lloyd has provided verbal consent on 07/18/2022 for a virtual visit (video or telephone). Leeanne Rio, [place]  Date: 07/18/2022 8:25 AM  Virtual Visit via Video Note   I, Leeanne Rio, connected with  Lynn Lloyd  ([id number], 10-Aug-1956) on 07/18/22 at  8:30 AM EST by a video-enabled telemedicine application and verified that I am speaking with the correct person using two identifiers.  Location: Patient: Virtual Visit  Location Patient: Home Provider: Virtual Visit Location Provider: Home Office   I discussed the limitations of evaluation and management by telemedicine and the availability of in person appointments. The patient expressed understanding and agreed to proceed.    History of Present Illness: Lynn Lloyd is a 66 y.o. who identifies as a female who was assigned female at birth, and is being seen today for possible UTI. Notes this would be her 3rd UTI of the year. 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 Since yesterday noting urinary urgency, frequency, suprapubic pressure and mild nausea without vomiting. Denies back or flank pain, emesis, fever or chills. Has to go out of town today to help with her daughter undergoing chemotherapy. Urology was unable to see her today.  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   Elevated LDL cholesterol level 08/27/2020   Lumbar spinal stenosis 08/25/2020   No energy 01/20/2018   Chronic idiopathic constipation 01/18/2018   RLQ abdominal pain 01/18/2018   Bronchospasm 11/15/2017   Allergic rhinitis 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 11/15/2017   Environmental and seasonal allergies 11/15/2017   Encounter for adjustment or management of vascular access device 09/13/2016   Right shoulder pain 03/02/2016   Right knee pain 05/06/2015   Post concussion syndrome 11/07/2013   Abdominal pain, other specified site  10/03/2010   POSTMENOPAUSAL SYNDROME 12/15/2009   FINGER PAIN 10/22/2009   PAIN IN JOINT, MULTIPLE SITES 09/28/2009   TINNITUS 07/13/2009   Otalgia 07/13/2009   SOLAR KERATOSIS 03/31/2009   SEBORRHEIC KERATOSIS 08/12/2008   NEVUS, ATYPICAL 01/09/2008   POSTMENOPAUSAL STATUS 12/06/2007   Other malaise and fatigue 12/06/2007   WARTS, MULTIPLE 09/09/2007   SPINAL STENOSIS,  CERVICAL 09/09/2007   NEOPLASM, SKIN, UNCERTAIN BEHAVIOR 33/00/7622    Allergies:  Allergies  Allergen Reactions   Amoxicillin-Pot Clavulanate     "get really spaced out" pharmacist said was due to clavulanate   Cauliflower [Brassica Oleracea]    Codeine Nausea Only   Corn-Containing Products Other (See Comments)   Cow's Milk  [Milk (Cow)] Cough and Other (See Comments)   Gabapentin     Vision changes   Gluten Meal Other (See Comments)    Bloating   Soy Allergy Other (See Comments)    Bloating   Latex Other (See Comments)    whelps   Medications:  Current Outpatient Medications:    cephALEXin (KEFLEX) 500 MG capsule, Take 1 capsule (500 mg total) by mouth 2 (two) times daily for 7 days., Disp: 14 capsule, Rfl: 0   Bacillus Coagulans-Inulin (PROBIOTIC-PREBIOTIC) 1-250 BILLION-MG CAPS, Take 1 capsule by mouth daily., Disp: , Rfl:    Biotin 1000 MCG tablet, , Disp: , Rfl:    cholecalciferol (VITAMIN D3) 25 MCG (1000 UNIT) tablet, Take 5,000 Units by mouth daily., Disp: , Rfl:    cyclobenzaprine (FLEXERIL) 10 MG tablet, Take 1 tablet (10 mg total) by mouth 3 (three) times daily as needed for muscle spasms., Disp: 90 tablet, Rfl: 1   fluticasone (FLONASE) 50 MCG/ACT nasal spray, SPRAY 2 SPRAYS INTO EACH NOSTRIL EVERY DAY, Disp: 48 mL, Rfl: 1   ibuprofen (ADVIL) 400 MG tablet, Take 400 mg by mouth every 6 (six) hours as needed., Disp: , Rfl:    Magnesium 250 MG TABS, Take by mouth., Disp: , Rfl:    vitamin C (ASCORBIC ACID) 500 MG tablet, Take 500 mg by mouth daily., Disp: , Rfl:    Zinc 50 MG CAPS, Take by mouth., Disp: , Rfl:   Observations/Objective: Patient is well-developed, well-nourished in no acute distress.  Resting comfortably at home.  Head is normocephalic, atraumatic.  No labored breathing. Speech is clear and coherent with logical content.  Patient is alert and oriented at baseline.   Assessment and Plan: 1. Suspected UTI - cephALEXin (KEFLEX) 500 MG capsule; Take  1 capsule (500 mg total) by mouth 2 (two) times daily for 7 days.  Dispense: 14 capsule; Refill: 0  Will start treatment with Keflex 500 mg BID x 7 days based on last urine culture. Supportive measures and OTC medications reviewed. Will need in person eval if symptoms not resolving. Also encouraged her to schedule follow-up with Urology regarding her chronic caruncle.   Follow Up Instructions I discussed the assessment and treatment plan with the patient. The patient was provided an opportunity to ask questions and all were answered. The patient agreed with the plan and demonstrated an understanding of the instructions.  A copy of instructions were sent to the patient via MyChart unless otherwise noted below.   The patient was advised to call back or seek an in-person evaluation if the symptoms worsen or if the condition fails to improve as anticipated.  Time:  I spent 10 minutes with the patient via telehealth technology discussing the above problems/concerns.    Leeanne Rio, PA-C

## 2022-08-04 DIAGNOSIS — R350 Frequency of micturition: Secondary | ICD-10-CM | POA: Diagnosis not present

## 2022-08-04 DIAGNOSIS — N362 Urethral caruncle: Secondary | ICD-10-CM | POA: Diagnosis not present

## 2022-08-04 DIAGNOSIS — N390 Urinary tract infection, site not specified: Secondary | ICD-10-CM | POA: Diagnosis not present

## 2022-09-11 ENCOUNTER — Ambulatory Visit (INDEPENDENT_AMBULATORY_CARE_PROVIDER_SITE_OTHER): Payer: No Typology Code available for payment source | Admitting: Physician Assistant

## 2022-09-11 ENCOUNTER — Encounter: Payer: Self-pay | Admitting: Physician Assistant

## 2022-09-11 ENCOUNTER — Ambulatory Visit: Payer: No Typology Code available for payment source

## 2022-09-11 VITALS — BP 120/88 | HR 70 | Ht 68.0 in | Wt 145.0 lb

## 2022-09-11 DIAGNOSIS — M542 Cervicalgia: Secondary | ICD-10-CM

## 2022-09-11 DIAGNOSIS — Z1329 Encounter for screening for other suspected endocrine disorder: Secondary | ICD-10-CM

## 2022-09-11 DIAGNOSIS — M545 Low back pain, unspecified: Secondary | ICD-10-CM | POA: Diagnosis not present

## 2022-09-11 DIAGNOSIS — M792 Neuralgia and neuritis, unspecified: Secondary | ICD-10-CM

## 2022-09-11 DIAGNOSIS — M546 Pain in thoracic spine: Secondary | ICD-10-CM

## 2022-09-11 DIAGNOSIS — Z0000 Encounter for general adult medical examination without abnormal findings: Secondary | ICD-10-CM

## 2022-09-11 DIAGNOSIS — G8929 Other chronic pain: Secondary | ICD-10-CM | POA: Diagnosis not present

## 2022-09-11 DIAGNOSIS — Z13220 Encounter for screening for lipoid disorders: Secondary | ICD-10-CM

## 2022-09-11 DIAGNOSIS — E559 Vitamin D deficiency, unspecified: Secondary | ICD-10-CM

## 2022-09-11 DIAGNOSIS — E7800 Pure hypercholesterolemia, unspecified: Secondary | ICD-10-CM | POA: Diagnosis not present

## 2022-09-11 DIAGNOSIS — N390 Urinary tract infection, site not specified: Secondary | ICD-10-CM

## 2022-09-11 DIAGNOSIS — M5442 Lumbago with sciatica, left side: Secondary | ICD-10-CM

## 2022-09-11 DIAGNOSIS — S22080A Wedge compression fracture of T11-T12 vertebra, initial encounter for closed fracture: Secondary | ICD-10-CM | POA: Diagnosis not present

## 2022-09-11 DIAGNOSIS — Z131 Encounter for screening for diabetes mellitus: Secondary | ICD-10-CM | POA: Diagnosis not present

## 2022-09-11 MED ORDER — MELOXICAM 15 MG PO TABS: 15.0000 mg | ORAL_TABLET | Freq: Every day | ORAL | 1 refills | Status: DC

## 2022-09-11 NOTE — Progress Notes (Signed)
Complete physical exam  Patient: Lynn Lloyd   DOB: 10-28-1955   67 y.o. Female  MRN: [id number]  Subjective:    Chief Complaint  Patient presents with   Annual Exam    Lynn Lloyd is a 67 y.o. female who presents today for a complete physical exam. She reports consuming a general diet.  Pt walks about 3 miles every day.  She generally feels well. She reports sleeping well. She does have additional problems to discuss today.   Pt has had low back pain and neck pain since August after lifting a chicken coop. She felt immediately pain in low back that radiated down left leg into calf. She had some neck pain as well with pain and numbness into her left pinky. She has not had time to address it. Taken some ibuprofen and tylenol. Using flexeril. No weakness or strength changes. She is going to chiropractor which has helped a lot.      Most recent fall risk assessment:    09/11/2022    9:30 AM  Fall Risk   Falls in the past year? 0  Number falls in past yr: 0  Injury with Fall? 0  Risk for fall due to : No Fall Risks  Follow up Falls evaluation completed     Most recent depression screenings:    09/11/2022    9:30 AM 09/09/2021    8:47 AM  PHQ 2/9 Scores  PHQ - 2 Score 0 1  PHQ- 9 Score  7    Vision:Within last year and Dental: No current dental problems and Receives regular dental care  Patient Active Problem List   Diagnosis Date Noted   Chronic left-sided low back pain with left-sided sciatica 09/11/2022   Vitamin D insufficiency 09/11/2022   Neck pain 09/11/2022   Radicular pain in left arm 09/11/2022   Left lower quadrant abdominal pain 12/26/2021   Skin tag 09/09/2021   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   Elevated LDL cholesterol level 08/27/2020   Lumbar spinal stenosis 08/25/2020   No energy 01/20/2018   Chronic idiopathic constipation 01/18/2018   RLQ abdominal pain 01/18/2018   Bronchospasm 11/15/2017   Allergic rhinitis 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 11/15/2017    Environmental and seasonal allergies 11/15/2017   Encounter for adjustment or management of vascular access device 09/13/2016   Right shoulder pain 03/02/2016   Right knee pain 05/06/2015   Post concussion syndrome 11/07/2013   Abdominal pain, other specified site 10/03/2010   POSTMENOPAUSAL SYNDROME 12/15/2009   FINGER PAIN 10/22/2009   PAIN IN JOINT, MULTIPLE SITES 09/28/2009   TINNITUS 07/13/2009   Otalgia 07/13/2009   SOLAR KERATOSIS 03/31/2009   SEBORRHEIC KERATOSIS 08/12/2008   NEVUS, ATYPICAL 01/09/2008   POSTMENOPAUSAL STATUS 12/06/2007   Other malaise and fatigue 12/06/2007   WARTS, MULTIPLE 09/09/2007   SPINAL STENOSIS, CERVICAL 09/09/2007   NEOPLASM, SKIN, UNCERTAIN BEHAVIOR 55/73/2202   Past Medical History:  Diagnosis Date   Chronic kidney disease    Constipation    Endometriosis    Hemorrhoid    Family History  Problem Relation Age of Onset   Thyroid disease Father    Parkinsonism Father    Cancer Mother        acl   Hypertension Mother    Thyroid disease Mother    Asthma Mother    COPD Mother    Allergic rhinitis Mother    Allergies  Allergen Reactions   Amoxicillin-Pot Clavulanate     "get really spaced out"  pharmacist said was due to clavulanate   Cauliflower [Brassica Oleracea]    Codeine Nausea Only   Corn-Containing Products Other (See Comments)   Cow's Milk  [Milk (Cow)] Cough and Other (See Comments)   Gabapentin     Vision changes   Gluten Meal Other (See Comments)    Bloating   Soy Allergy Other (See Comments)    Bloating   Latex Other (See Comments)    whelps      Patient Care Team: Lavada Mesi as PCP - General (Family Medicine)   Outpatient Medications Prior to Visit  Medication Sig   Bacillus Coagulans-Inulin (PROBIOTIC-PREBIOTIC) 1-250 BILLION-MG CAPS Take 1 capsule by mouth daily.   cephALEXin (KEFLEX) 250 MG capsule Take 250 mg by mouth daily.   conjugated estrogens (PREMARIN) vaginal cream Place 1 applicator  vaginally once a week.   cyclobenzaprine (FLEXERIL) 10 MG tablet Take 1 tablet (10 mg total) by mouth 3 (three) times daily as needed for muscle spasms.   fluticasone (FLONASE) 50 MCG/ACT nasal spray SPRAY 2 SPRAYS INTO EACH NOSTRIL EVERY DAY   [DISCONTINUED] Biotin 1000 MCG tablet    [DISCONTINUED] cholecalciferol (VITAMIN D3) 25 MCG (1000 UNIT) tablet Take 5,000 Units by mouth daily.   [DISCONTINUED] ibuprofen (ADVIL) 400 MG tablet Take 400 mg by mouth every 6 (six) hours as needed.   [DISCONTINUED] Magnesium 250 MG TABS Take by mouth.   [DISCONTINUED] vitamin C (ASCORBIC ACID) 500 MG tablet Take 500 mg by mouth daily.   [DISCONTINUED] Zinc 50 MG CAPS Take by mouth.   No facility-administered medications prior to visit.    Review of Systems  All other systems reviewed and are negative.         Objective:     BP 120/88   Pulse 70   Ht '5\' 8"'$  (1.727 m)   Wt 145 lb (65.8 kg)   SpO2 100%   BMI 22.05 kg/m  BP Readings from Last 3 Encounters:  09/11/22 120/88  05/26/22 124/82  04/26/22 134/88   Wt Readings from Last 3 Encounters:  09/11/22 145 lb (65.8 kg)  04/26/22 148 lb (67.1 kg)  12/26/21 148 lb (67.1 kg)      Physical Exam Vitals reviewed.     BP 120/88   Pulse 70   Ht '5\' 8"'$  (1.727 m)   Wt 145 lb (65.8 kg)   SpO2 100%   BMI 22.05 kg/m   General Appearance:    Alert, cooperative, no distress, appears stated age  Head:    Normocephalic, without obvious abnormality, atraumatic  Eyes:    PERRL, conjunctiva/corneas clear, EOM's intact, fundi    benign, both eyes  Ears:    Normal TM's and external ear canals, both ears  Nose:   Nares normal, septum midline, mucosa normal, no drainage    or sinus tenderness  Throat:   Lips, mucosa, and tongue normal; teeth and gums normal  Neck:   Supple, symmetrical, trachea midline, no adenopathy;    thyroid:  no enlargement/tenderness/nodules; no carotid   bruit or JVD  Back:     Symmetric, no curvature, ROM normal, no CVA  tenderness  Lungs:     Clear to auscultation bilaterally, respirations unlabored  Chest Wall:    No tenderness or deformity   Heart:    Regular rate and rhythm, S1 and S2 normal, no murmur, rub   or gallop     Abdomen:     Soft, non-tender, bowel sounds active all four quadrants,  no masses, no organomegaly        Extremities:   Extremities normal, atraumatic, no cyanosis or edema, normal strength 5/5 of LE, negative SLR, no spinal tenderness to palpation, tight paraspinal muscles in lumbar region Left hand grip 5/5 Some tenderness over lateral epicondyle  Pulses:   2+ and symmetric all extremities  Skin:   Skin color, texture, turgor normal, no rashes or lesions  Lymph nodes:   Cervical, supraclavicular, and axillary nodes normal  Neurologic:   CNII-XII intact, normal strength, sensation and reflexes    throughout      Assessment & Plan:    Routine Health Maintenance and Physical Exam  Immunization History  Administered Date(s) Administered   PFIZER(Purple Top)SARS-COV-2 Vaccination 11/06/2019, 11/27/2019   Td 03/21/2004   Tdap 10/25/2011    Health Maintenance  Topic Date Due   Medicare Annual Wellness (AWV)  Never done   COVID-19 Vaccine (3 - Pfizer risk series) 09/27/2022 (Originally 12/25/2019)   INFLUENZA VACCINE  11/19/2022 (Originally 03/21/2022)   Zoster Vaccines- Shingrix (1 of 2) 12/11/2022 (Originally 12/19/1973)   Pneumonia Vaccine 22+ Years old (1 - PCV) 09/12/2023 (Originally 12/20/2019)   MAMMOGRAM  09/23/2023   COLONOSCOPY (Pts 45-3yr Insurance coverage will need to be confirmed)  02/07/2024   DEXA SCAN  Completed   Hepatitis C Screening  Completed   HPV VACCINES  Aged Out   DTaP/Tdap/Td  Discontinued    Discussed health benefits of physical activity, and encouraged her to engage in regular exercise appropriate for her age and condition.  .Marland KitchenTillman Abidewas seen today for annual exam.  Diagnoses and all orders for this visit:  Routine physical examination -      TSH -     Lipid Panel w/reflex Direct LDL -     COMPLETE METABOLIC PANEL WITH GFR -     CBC with Differential/Platelet -     Vitamin D (25 hydroxy)  Vitamin D insufficiency -     Vitamin D (25 hydroxy)  Screening for diabetes mellitus -     COMPLETE METABOLIC PANEL WITH GFR  Screening for lipid disorders -     Lipid Panel w/reflex Direct LDL  Thyroid disorder screen -     TSH  Chronic left-sided low back pain with left-sided sciatica -     DG Lumbar Spine Complete; Future -     meloxicam (MOBIC) 15 MG tablet; Take 1 tablet (15 mg total) by mouth daily.  Neck pain -     DG Cervical Spine Complete; Future -     DG Thoracic Spine W/Swimmers; Future -     DG Lumbar Spine Complete; Future -     meloxicam (MOBIC) 15 MG tablet; Take 1 tablet (15 mg total) by mouth daily.  Radicular pain in left arm -     meloxicam (MOBIC) 15 MG tablet; Take 1 tablet (15 mg total) by mouth daily.  Recurrent UTI    Discussed 150 minutes of exercise a week.  Encouraged vitamin D 1000 units and Calcium '1300mg'$  or 4 servings of dairy a day.  PHQ/GAD look great Fasting labs ordered Vitals look great No need for pap Mammogram scheduled Bone density UTD Colonoscopy UTD Pt declined vaccines except Tdap but will get at pharmacy Follow up in 1 year for next CPE  Discussed low back pain/neck pain/pinky numbness No red flag symptoms today Likely in August she had a herniated disc and pinched nerve ? Some DDD Will get xrays Pain since August waxing and waning Mobic  daily as needed Encouraged yoga/pilates Consider wearing tennis elbow strap Follow up in 1-2 months for treatment planning or consider referral to sports medicine        Iran Planas, PA-C

## 2022-09-11 NOTE — Patient Instructions (Addendum)
Will get Tdap at pharmacy Xrays of spine ordered  Health Maintenance, Female Adopting a healthy lifestyle and getting preventive care are important in promoting health and wellness. Ask your health care provider about: The right schedule for you to have regular tests and exams. Things you can do on your own to prevent diseases and keep yourself healthy. What should I know about diet, weight, and exercise? Eat a healthy diet  Eat a diet that includes plenty of vegetables, fruits, low-fat dairy products, and lean protein. Do not eat a lot of foods that are high in solid fats, added sugars, or sodium. Maintain a healthy weight Body mass index (BMI) is used to identify weight problems. It estimates body fat based on height and weight. Your health care provider can help determine your BMI and help you achieve or maintain a healthy weight. Get regular exercise Get regular exercise. This is one of the most important things you can do for your health. Most adults should: Exercise for at least 150 minutes each week. The exercise should increase your heart rate and make you sweat (moderate-intensity exercise). Do strengthening exercises at least twice a week. This is in addition to the moderate-intensity exercise. Spend less time sitting. Even light physical activity can be beneficial. Watch cholesterol and blood lipids Have your blood tested for lipids and cholesterol at 68 years of age, then have this test every 5 years. Have your cholesterol levels checked more often if: Your lipid or cholesterol levels are high. You are older than 68 years of age. You are at high risk for heart disease. What should I know about cancer screening? Depending on your health history and family history, you may need to have cancer screening at various ages. This may include screening for: Breast cancer. Cervical cancer. Colorectal cancer. Skin cancer. Lung cancer. What should I know about heart disease, diabetes,  and high blood pressure? Blood pressure and heart disease High blood pressure causes heart disease and increases the risk of stroke. This is more likely to develop in people who have high blood pressure readings or are overweight. Have your blood pressure checked: Every 3-5 years if you are 44-33 years of age. Every year if you are 78 years old or older. Diabetes Have regular diabetes screenings. This checks your fasting blood sugar level. Have the screening done: Once every three years after age 69 if you are at a normal weight and have a low risk for diabetes. More often and at a younger age if you are overweight or have a high risk for diabetes. What should I know about preventing infection? Hepatitis B If you have a higher risk for hepatitis B, you should be screened for this virus. Talk with your health care provider to find out if you are at risk for hepatitis B infection. Hepatitis C Testing is recommended for: Everyone born from [birth year] through 1965. Anyone with known risk factors for hepatitis C. Sexually transmitted infections (STIs) Get screened for STIs, including gonorrhea and chlamydia, if: You are sexually active and are younger than 68 years of age. You are older than 68 years of age and your health care provider tells you that you are at risk for this type of infection. Your sexual activity has changed since you were last screened, and you are at increased risk for chlamydia or gonorrhea. Ask your health care provider if you are at risk. Ask your health care provider about whether you are at high risk for HIV. Your health care  provider may recommend a prescription medicine to help prevent HIV infection. If you choose to take medicine to prevent HIV, you should first get tested for HIV. You should then be tested every 3 months for as long as you are taking the medicine. Pregnancy If you are about to stop having your period (premenopausal) and you may become pregnant, seek  counseling before you get pregnant. Take 400 to 800 micrograms (mcg) of folic acid every day if you become pregnant. Ask for birth control (contraception) if you want to prevent pregnancy. Osteoporosis and menopause Osteoporosis is a disease in which the bones lose minerals and strength with aging. This can result in bone fractures. If you are 60 years old or older, or if you are at risk for osteoporosis and fractures, ask your health care provider if you should: Be screened for bone loss. Take a calcium or vitamin D supplement to lower your risk of fractures. Be given hormone replacement therapy (HRT) to treat symptoms of menopause. Follow these instructions at home: Alcohol use Do not drink alcohol if: Your health care provider tells you not to drink. You are pregnant, may be pregnant, or are planning to become pregnant. If you drink alcohol: Limit how much you have to: 0-1 drink a day. Know how much alcohol is in your drink. In the [place] one drink equals one 12 oz bottle of beer (355 mL), one 5 oz glass of wine (148 mL), or one 1 oz glass of hard liquor (44 mL). Lifestyle Do not use any products that contain nicotine or tobacco. These products include cigarettes, chewing tobacco, and vaping devices, such as e-cigarettes. If you need help quitting, ask your health care provider. Do not use street drugs. Do not share needles. Ask your health care provider for help if you need support or information about quitting drugs. General instructions Schedule regular health, dental, and eye exams. Stay current with your vaccines. Tell your health care provider if: You often feel depressed. You have ever been abused or do not feel safe at home. Summary Adopting a healthy lifestyle and getting preventive care are important in promoting health and wellness. Follow your health care provider's instructions about healthy diet, exercising, and getting tested or screened for diseases. Follow your  health care provider's instructions on monitoring your cholesterol and blood pressure. This information is not intended to replace advice given to you by your health care provider. Make sure you discuss any questions you have with your health care provider. Document Revised: 12/27/2020 Document Reviewed: 12/27/2020 Elsevier Patient Education  Lynn Lloyd.

## 2022-09-12 LAB — COMPLETE METABOLIC PANEL WITH GFR
AG Ratio: 1.8 (calc) (ref 1.0–2.5)
ALT: 16 U/L (ref 6–29)
AST: 19 U/L (ref 10–35)
Albumin: 4.7 g/dL (ref 3.6–5.1)
Alkaline phosphatase (APISO): 64 U/L (ref 37–153)
BUN: 11 mg/dL (ref 7–25)
CO2: 28 mmol/L (ref 20–32)
Calcium: 9.6 mg/dL (ref 8.6–10.4)
Chloride: 103 mmol/L (ref 98–110)
Creat: 0.76 mg/dL (ref 0.50–1.05)
Globulin: 2.6 g/dL (calc) (ref 1.9–3.7)
Glucose, Bld: 89 mg/dL (ref 65–99)
Potassium: 4.4 mmol/L (ref 3.5–5.3)
Sodium: 139 mmol/L (ref 135–146)
Total Bilirubin: 0.6 mg/dL (ref 0.2–1.2)
Total Protein: 7.3 g/dL (ref 6.1–8.1)
eGFR: 86 mL/min/{1.73_m2} (ref >=60)

## 2022-09-12 LAB — CBC WITH DIFFERENTIAL/PLATELET
Absolute Monocytes: 566 cells/uL (ref 200–950)
Basophils Absolute: 72 cells/uL (ref 0–200)
Basophils Relative: 1.1 %
Eosinophils Absolute: 91 cells/uL (ref 15–500)
Eosinophils Relative: 1.4 %
HCT: 43.6 % (ref 35.0–45.0)
Hemoglobin: 15 g/dL (ref 11.7–15.5)
Lymphs Abs: 1853 cells/uL (ref 850–3900)
MCH: 31.6 pg (ref 27.0–33.0)
MCHC: 34.4 g/dL (ref 32.0–36.0)
MCV: 91.8 fL (ref 80.0–100.0)
MPV: 10.8 fL (ref 7.5–12.5)
Monocytes Relative: 8.7 %
Neutro Abs: 3920 cells/uL (ref 1500–7800)
Neutrophils Relative %: 60.3 %
Platelets: 246 10*3/uL (ref 140–400)
RBC: 4.75 10*6/uL (ref 3.80–5.10)
RDW: 12 % (ref 11.0–15.0)
Total Lymphocyte: 28.5 %
WBC: 6.5 10*3/uL (ref 3.8–10.8)

## 2022-09-12 LAB — LIPID PANEL W/REFLEX DIRECT LDL
Cholesterol: 255 mg/dL — ABNORMAL HIGH (ref <200)
HDL: 91 mg/dL (ref >=50)
LDL Cholesterol (Calc): 148 mg/dL (calc) — ABNORMAL HIGH
Non-HDL Cholesterol (Calc): 164 mg/dL (calc) — ABNORMAL HIGH (ref <130)
Total CHOL/HDL Ratio: 2.8 (calc) (ref <5.0)
Triglycerides: 61 mg/dL (ref <150)

## 2022-09-12 LAB — TSH: TSH: 3.21 mIU/L (ref 0.40–4.50)

## 2022-09-12 LAB — VITAMIN D 25 HYDROXY (VIT D DEFICIENCY, FRACTURES): Vit D, 25-Hydroxy: 34 ng/mL (ref 30–100)

## 2022-09-12 NOTE — Progress Notes (Signed)
In general the guidelines of mediterranean diet is a great heart healthy diet to follow. Lots of lean meats and plants and vegetables.

## 2022-09-12 NOTE — Progress Notes (Signed)
Lynn Lloyd,   Thyroid looks good.  Kidney, liver, glucose looks good.  Vitamin D normal but very low normal 1000 to 2000 units daily with dairy.  Normal hemoglobin.  HDL, good cholesterol, looks amazing!  LDL, bad cholesterol, not to optimal goal.   Your 10 year CV risk is 5.7 percent. Under 7.5 percent we can still continue lifestyle management or you could consider statin to get you to optimal LDL goal. Thoughts?   Marland KitchenMarland KitchenThe 10-year ASCVD risk score (Arnett DK, et al., 2019) is: 5.7%   Values used to calculate the score:     Age: 68 years     Sex: Female     Is Non-Hispanic African American: No     Diabetic: No     Tobacco smoker: No     Systolic Blood Pressure: 438 mmHg     Is BP treated: No     HDL Cholesterol: 91 mg/dL     Total Cholesterol: 255 mg/dL

## 2022-09-13 NOTE — Progress Notes (Signed)
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 Compression can be very painful. Physical therapy can help. I would get in with Dr. Darene Lamer sports med to make a treatment plan for this and see what your options are.

## 2022-10-10 ENCOUNTER — Other Ambulatory Visit: Payer: Self-pay | Admitting: Physician Assistant

## 2022-10-10 ENCOUNTER — Other Ambulatory Visit: Payer: Self-pay

## 2022-10-10 DIAGNOSIS — Z1231 Encounter for screening mammogram for malignant neoplasm of breast: Secondary | ICD-10-CM

## 2022-10-15 ENCOUNTER — Telehealth: Payer: No Typology Code available for payment source | Admitting: Nurse Practitioner

## 2022-10-15 DIAGNOSIS — J0100 Acute maxillary sinusitis, unspecified: Secondary | ICD-10-CM

## 2022-10-15 MED ORDER — BENZONATATE 100 MG PO CAPS: 100.0000 mg | ORAL_CAPSULE | Freq: Three times a day (TID) | ORAL | 0 refills | Status: DC | PRN

## 2022-10-15 MED ORDER — DOXYCYCLINE HYCLATE 100 MG PO TABS: 100.0000 mg | ORAL_TABLET | Freq: Two times a day (BID) | ORAL | 0 refills | Status: AC

## 2022-10-15 NOTE — Patient Instructions (Signed)
Lynn Lloyd, thank you for joining Tish Men, NP for today's virtual visit.  While this provider is not your primary care provider (PCP), if your PCP is located in our provider database this encounter information will be shared with them immediately following your visit.   [facility] account gives you access to today's visit and all your visits, tests, and labs performed at [facility] " click here if you don't have a [facility] account or go to [email or web address]  Consent: (Patient) Lynn Lloyd provided verbal consent for this virtual visit at the beginning of the encounter.  Current Medications:  Current Outpatient Medications:    benzonatate (TESSALON) 100 MG capsule, Take 1 capsule (100 mg total) by mouth 3 (three) times daily as needed for cough., Disp: 30 capsule, Rfl: 0   doxycycline (VIBRA-TABS) 100 MG tablet, Take 1 tablet (100 mg total) by mouth 2 (two) times daily for 10 days., Disp: 20 tablet, Rfl: 0   Bacillus Coagulans-Inulin (PROBIOTIC-PREBIOTIC) 1-250 BILLION-MG CAPS, Take 1 capsule by mouth daily., Disp: , Rfl:    cephALEXin (KEFLEX) 250 MG capsule, Take 250 mg by mouth daily., Disp: , Rfl:    conjugated estrogens (PREMARIN) vaginal cream, Place 1 applicator vaginally once a week., Disp: , Rfl:    cyclobenzaprine (FLEXERIL) 10 MG tablet, Take 1 tablet (10 mg total) by mouth 3 (three) times daily as needed for muscle spasms., Disp: 90 tablet, Rfl: 1   fluticasone (FLONASE) 50 MCG/ACT nasal spray, SPRAY 2 SPRAYS INTO EACH NOSTRIL EVERY DAY, Disp: 48 mL, Rfl: 1   meloxicam (MOBIC) 15 MG tablet, Take 1 tablet (15 mg total) by mouth daily., Disp: 30 tablet, Rfl: 1   Medications ordered in this encounter:  Meds ordered this encounter  Medications   doxycycline (VIBRA-TABS) 100 MG tablet    Sig: Take 1 tablet (100 mg total) by mouth 2 (two) times daily for 10 days.    Dispense:  20 tablet    Refill:  0    Patient  reports she has GI side effects only. She reports she can take medication.   benzonatate (TESSALON) 100 MG capsule    Sig: Take 1 capsule (100 mg total) by mouth 3 (three) times daily as needed for cough.    Dispense:  30 capsule    Refill:  0     *If you need refills on other medications prior to your next appointment, please contact your pharmacy*  Follow-Up: Call back or seek an in-person evaluation if the symptoms worsen or if the condition fails to improve as anticipated.  [facility] [phone]  Other Instructions Take medication as directed. Continue Flonase. Increase fluids and get plenty of rest. May take over-the-counter Tylenol as needed for pain, fever, or general discomfort. Recommend normal saline nasal spray to help with nasal congestion throughout the day. For your cough, it may be helpful to use a humidifier at bedtime during sleep. If your symptoms fail to improve with this treatment, please follow-up with your PCP for further evaluation.     If you have been instructed to have an in-person evaluation today at a local Urgent Care facility, please use the link below. It will take you to a list of all of our available [facility], including address, phone number and hours of operation. Please do not delay care.  [facility]  If you or a family member do not have a primary care provider,  use the link below to schedule a visit and establish care. When you choose a [facility] primary care physician or advanced practice provider, you gain a long-term partner in health. Find a Primary Care Provider  Learn more about [facility] in-office and virtual care options: [facility]

## 2022-10-15 NOTE — Progress Notes (Signed)
Virtual Visit Consent   Lynn Lloyd, you are scheduled for a virtual visit with a [facility] provider today. Just as with appointments in the office, your consent must be obtained to participate. Your consent will be active for this visit and any virtual visit you may have with one of our providers in the next 365 days. If you have a MyChart account, a copy of this consent can be sent to you electronically.  As this is a virtual visit, video technology does not allow for your provider to perform a traditional examination. This may limit your provider's ability to fully assess your condition. If your provider identifies any concerns that need to be evaluated in person or the need to arrange testing (such as labs, EKG, etc.), we will make arrangements to do so. Although advances in technology are sophisticated, we cannot ensure that it will always work on either your end or our end. If the connection with a video visit is poor, the visit may have to be switched to a telephone visit. With either a video or telephone visit, we are not always able to ensure that we have a secure connection.  By engaging in this virtual visit, you consent to the provision of healthcare and authorize for your insurance to be billed (if applicable) for the services provided during this visit. Depending on your insurance coverage, you may receive a charge related to this service.  I need to obtain your verbal consent now. Are you willing to proceed with your visit today? Yes. Lynn Lloyd has provided verbal consent on 10/15/2022 for a virtual visit (video or telephone). Tish Men, NP  Date: 10/15/2022 11:46 AM  Virtual Visit via Video Note   I, Lynn Lloyd, connected with  Lynn Lloyd  ([id number], [id number]) on 10/15/22 at 11:45 AM EST by a video-enabled telemedicine application and verified that I am speaking with the correct person using two identifiers.  Location: Patient: Virtual  Visit Location Patient: Home Provider: Virtual Visit Location Provider: Home   I discussed the limitations of evaluation and management by telemedicine and the availability of in person appointments. The patient expressed understanding and agreed to proceed.    History of Present Illness: Lynn Lloyd is a 67 y.o. who identifies as a female who was assigned female at birth, and is being seen today for upper respiratory symptoms. She complains of nasal drainage, nasal congestion, bilateral ear fullness/pressure, cough and headache. Symptoms started for more than one week. Over the past several days, she has developed facial pain and pressure, states, "my teeth hurt". She thinks she may have had fever one week ago. She denies wheezing, SOB or difficulty breathing. She reports she was taking Keflex for urinary tract infection prevention, but has stopped over the past week. She reports she does not think this is flu or COVID, reports history sinusitis every year. She has taken Flonase and Tessalon perles for her symptoms.   HPI: HPI  Problems:  Patient Active Problem List   Diagnosis Date Noted   Chronic left-sided low back pain with left-sided sciatica 09/11/2022   Vitamin D insufficiency 09/11/2022   Neck pain 09/11/2022   Radicular pain in left arm 09/11/2022   Left lower quadrant abdominal pain 12/26/2021   Skin tag 09/09/2021   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   Elevated LDL cholesterol level 08/27/2020   Lumbar spinal stenosis 08/25/2020   No energy 01/20/2018   Chronic idiopathic constipation 01/18/2018   RLQ abdominal  pain 01/18/2018   Bronchospasm 11/15/2017   Allergic rhinitis 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 11/15/2017   Environmental and seasonal allergies 11/15/2017   Encounter for adjustment or management of vascular access device 09/13/2016   Right shoulder pain 03/02/2016   Right knee pain 05/06/2015   Post concussion syndrome 11/07/2013   Abdominal pain, other specified site  10/03/2010   POSTMENOPAUSAL SYNDROME 12/15/2009   FINGER PAIN 10/22/2009   PAIN IN JOINT, MULTIPLE SITES 09/28/2009   TINNITUS 07/13/2009   Otalgia 07/13/2009   SOLAR KERATOSIS 03/31/2009   SEBORRHEIC KERATOSIS 08/12/2008   NEVUS, ATYPICAL 01/09/2008   POSTMENOPAUSAL STATUS 12/06/2007   Other malaise and fatigue 12/06/2007   WARTS, MULTIPLE 09/09/2007   SPINAL STENOSIS, CERVICAL 09/09/2007   NEOPLASM, SKIN, UNCERTAIN BEHAVIOR [id number]    Allergies:  Allergies  Allergen Reactions   Amoxicillin-Pot Clavulanate     "get really spaced out" pharmacist said was due to clavulanate   Cauliflower [Brassica Oleracea]    Codeine Nausea Only   Corn-Containing Products Other (See Comments)   Cow's Milk  [Milk (Cow)] Cough and Other (See Comments)   Gabapentin     Vision changes   Gluten Meal Other (See Comments)    Bloating   Soy Allergy Other (See Comments)    Bloating   Latex Other (See Comments)    whelps   Medications:  Current Outpatient Medications:    Bacillus Coagulans-Inulin (PROBIOTIC-PREBIOTIC) 1-250 BILLION-MG CAPS, Take 1 capsule by mouth daily., Disp: , Rfl:    cephALEXin (KEFLEX) 250 MG capsule, Take 250 mg by mouth daily., Disp: , Rfl:    conjugated estrogens (PREMARIN) vaginal cream, Place 1 applicator vaginally once a week., Disp: , Rfl:    cyclobenzaprine (FLEXERIL) 10 MG tablet, Take 1 tablet (10 mg total) by mouth 3 (three) times daily as needed for muscle spasms., Disp: 90 tablet, Rfl: 1   fluticasone (FLONASE) 50 MCG/ACT nasal spray, SPRAY 2 SPRAYS INTO EACH NOSTRIL EVERY DAY, Disp: 48 mL, Rfl: 1   meloxicam (MOBIC) 15 MG tablet, Take 1 tablet (15 mg total) by mouth daily., Disp: 30 tablet, Rfl: 1  Observations/Objective: Patient is well-developed, well-nourished in no acute distress.  Resting comfortably  at home.  Head is normocephalic, atraumatic.  No labored breathing.  Speech is clear and coherent with logical content.  Patient is alert and oriented at  baseline.    Assessment and Plan: 1. Acute maxillary sinusitis, recurrence not specified - doxycycline (VIBRA-TABS) 100 MG tablet; Take 1 tablet (100 mg total) by mouth 2 (two) times daily for 10 days.  Dispense: 20 tablet; Refill: 0 - benzonatate (TESSALON) 100 MG capsule; Take 1 capsule (100 mg total) by mouth 3 (three) times daily as needed for cough.  Dispense: 30 capsule; Refill: 0  Suspect maxillary sinusitis. Will treat with Doxycycline '100mg'$  BID and Tessalon Perles '100mg'$  TID PRN. Supportive care recommendations were discussed with the patient and when follow-up with her PCP is indicated. Patient reports that she is able to take Doxycycline without anaphylaxis, SOB, she has GI side effects only. Patient is in agreement with this plan of care and verbalizes understanding, all questions were answered.   Follow Up Instructions: I discussed the assessment and treatment plan with the patient. The patient was provided an opportunity to ask questions and all were answered. The patient agreed with the plan and demonstrated an understanding of the instructions.  A copy of instructions were sent to the patient via MyChart unless otherwise noted below.   The patient was  advised to call back or seek an in-person evaluation if the symptoms worsen or if the condition fails to improve as anticipated.  Time:  I spent 10 minutes with the patient via telehealth technology discussing the above problems/concerns.    Tish Men, NP

## 2022-10-18 ENCOUNTER — Ambulatory Visit (INDEPENDENT_AMBULATORY_CARE_PROVIDER_SITE_OTHER): Payer: No Typology Code available for payment source

## 2022-10-18 DIAGNOSIS — Z1231 Encounter for screening mammogram for malignant neoplasm of breast: Secondary | ICD-10-CM

## 2022-10-20 NOTE — Progress Notes (Signed)
Normal mammogram. Follow up in 1 year.

## 2022-11-23 DIAGNOSIS — R102 Pelvic and perineal pain: Secondary | ICD-10-CM | POA: Diagnosis not present

## 2022-11-29 DIAGNOSIS — L820 Inflamed seborrheic keratosis: Secondary | ICD-10-CM | POA: Diagnosis not present

## 2022-11-29 DIAGNOSIS — L821 Other seborrheic keratosis: Secondary | ICD-10-CM | POA: Diagnosis not present

## 2022-11-29 DIAGNOSIS — L578 Other skin changes due to chronic exposure to nonionizing radiation: Secondary | ICD-10-CM | POA: Diagnosis not present

## 2022-11-29 DIAGNOSIS — L570 Actinic keratosis: Secondary | ICD-10-CM | POA: Diagnosis not present

## 2023-01-12 DIAGNOSIS — Z008 Encounter for other general examination: Secondary | ICD-10-CM | POA: Diagnosis not present

## 2023-01-12 DIAGNOSIS — Z6822 Body mass index (BMI) 22.0-22.9, adult: Secondary | ICD-10-CM | POA: Diagnosis not present

## 2023-01-12 DIAGNOSIS — E785 Hyperlipidemia, unspecified: Secondary | ICD-10-CM | POA: Diagnosis not present

## 2023-01-12 DIAGNOSIS — N182 Chronic kidney disease, stage 2 (mild): Secondary | ICD-10-CM | POA: Diagnosis not present

## 2023-03-21 DIAGNOSIS — L820 Inflamed seborrheic keratosis: Secondary | ICD-10-CM | POA: Diagnosis not present

## 2023-03-21 DIAGNOSIS — L821 Other seborrheic keratosis: Secondary | ICD-10-CM | POA: Diagnosis not present

## 2023-05-14 ENCOUNTER — Ambulatory Visit
Admission: RE | Admit: 2023-05-14 | Discharge: 2023-05-14 | Disposition: A | Discharge status: Home / Self Care | Payer: No Typology Code available for payment source | Source: Ambulatory Visit | Attending: Urgent Care | Admitting: Urgent Care

## 2023-05-14 ENCOUNTER — Other Ambulatory Visit: Payer: Self-pay

## 2023-05-14 VITALS — BP 145/89 | HR 68 | Temp 97.9°F | Resp 16

## 2023-05-14 DIAGNOSIS — H6993 Unspecified Eustachian tube disorder, bilateral: Secondary | ICD-10-CM | POA: Diagnosis not present

## 2023-05-14 DIAGNOSIS — N3000 Acute cystitis without hematuria: Secondary | ICD-10-CM | POA: Diagnosis not present

## 2023-05-14 LAB — POCT URINALYSIS DIP (MANUAL ENTRY)
Bilirubin, UA: NEGATIVE
Glucose, UA: NEGATIVE mg/dL
Ketones, POC UA: NEGATIVE mg/dL
Nitrite, UA: NEGATIVE
Protein Ur, POC: 30 mg/dL — AB
Spec Grav, UA: 1.025 (ref 1.010–1.025)
Urobilinogen, UA: 0.2 E.U./dL
pH, UA: 6.5 (ref 5.0–8.0)

## 2023-05-14 MED ORDER — PREDNISONE 50 MG PO TABS
50.0000 mg | ORAL_TABLET | Freq: Every day | ORAL | 0 refills | Status: DC
Start: 2023-05-14 — End: 2023-09-18

## 2023-05-14 MED ORDER — NITROFURANTOIN MONOHYD MACRO 100 MG PO CAPS
100.0000 mg | ORAL_CAPSULE | Freq: Two times a day (BID) | ORAL | 0 refills | Status: AC
Start: 2023-05-14 — End: 2023-05-19

## 2023-05-14 MED ORDER — URIBEL 118 MG PO CAPS: 1.0000 | ORAL_CAPSULE | Freq: Four times a day (QID) | ORAL | 0 refills | Status: DC

## 2023-05-14 NOTE — ED Provider Notes (Signed)
[facility]    CSN: [id number] Arrival date & time: 05/14/23  1842      History   Chief Complaint Chief Complaint  Patient presents with   Urinary Frequency    HPI MINDE GASH is a 67 y.o. female.   Pleasant 67 year old female presents today due to concerns of UTI.  Does have a history of recurrent UTIs for which she follows with urology.  She was prescribed estrogen cream and 250 mg Keflex to take on a daily basis to help with prevention.  States life got busy, and she forgot to use the estrogen cream.  She also reports that she stopped taking the Keflex back in April as she felt that this was actually causing worsening urinary symptoms.  Her current symptoms started on Wednesday of last week, has been taking Azo over-the-counter with significant relief to the discomfort.  She denies flank pain or hematuria.  She does report frequency, hesitancy, and dysuria. Does have a follow up with urology scheduled. Additionally, pt reports pressure and popping in her B ears since early August. Has tried numerous home remedies and nasal sprays without improvement.    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   Elevated LDL cholesterol level 08/27/2020   Lumbar spinal stenosis 08/25/2020   No energy 01/20/2018   Chronic idiopathic constipation 01/18/2018   RLQ abdominal pain 01/18/2018   Bronchospasm 11/15/2017   Allergic rhinitis 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 11/15/2017   Environmental and seasonal allergies 11/15/2017   Encounter for adjustment or  management of vascular access device 09/13/2016   Right shoulder pain 03/02/2016   Right knee pain 05/06/2015   Post concussion syndrome 11/07/2013   Abdominal pain, other specified site 10/03/2010   POSTMENOPAUSAL SYNDROME 12/15/2009   FINGER PAIN 10/22/2009   PAIN IN JOINT, MULTIPLE SITES 09/28/2009   TINNITUS 07/13/2009   Otalgia 07/13/2009   SOLAR KERATOSIS 03/31/2009   SEBORRHEIC KERATOSIS 08/12/2008   NEVUS, ATYPICAL 01/09/2008   POSTMENOPAUSAL STATUS 12/06/2007   Other malaise and fatigue 12/06/2007   WARTS, MULTIPLE 09/09/2007   SPINAL STENOSIS, CERVICAL 09/09/2007   NEOPLASM, SKIN, UNCERTAIN BEHAVIOR 12/11/2006    Past Surgical History:  Procedure Laterality Date   ADENOIDECTOMY     BUNIONECTOMY  12/11   CHOLECYSTECTOMY     LAPAROSCOPY     endometriosis   SALIVARY GLAND SURGERY     removal   TONSILLECTOMY AND ADENOIDECTOMY      OB History     Gravida  3   Para  3   Term  3   Preterm      AB      Living  4      SAB      IAB      Ectopic      Multiple  1   Live Births               Home Medications    Prior to Admission medications   Medication Sig Start Date End Date Taking?  Authorizing Provider  Meth-Hyo-M Bl-Na Phos-Ph Sal (URIBEL) 118 MG CAPS Take 1 capsule (118 mg total) by mouth in the morning, at noon, in the evening, and at bedtime. 05/14/23  Yes Matheo Rathbone L, PA  nitrofurantoin, macrocrystal-monohydrate, (MACROBID) 100 MG capsule Take 1 capsule (100 mg total) by mouth 2 (two) times daily for 5 days. 05/14/23 05/19/23 Yes Charrie Mcconnon L, PA  predniSONE (DELTASONE) 50 MG tablet Take 1 tablet (50 mg total) by mouth daily with breakfast. 05/14/23  Yes Abass Misener L, PA  Bacillus Coagulans-Inulin (PROBIOTIC-PREBIOTIC) 1-250 BILLION-MG CAPS Take 1 capsule by mouth daily.    [provider]  conjugated estrogens (PREMARIN) vaginal cream Place 1 applicator vaginally once a week.    [provider]  fluticasone  (FLONASE) 50 MCG/ACT nasal spray SPRAY 2 SPRAYS INTO EACH NOSTRIL EVERY DAY 03/23/22   Jomarie Longs, PA-C    Family History Family History  Problem Relation Age of Onset   Thyroid disease Father    Parkinsonism Father    Cancer Mother        acl   Hypertension Mother    Thyroid disease Mother    Asthma Mother    COPD Mother    Allergic rhinitis Mother     Social History Social History   Tobacco Use   Smoking status: Never   Smokeless tobacco: Never  Vaping Use   Vaping status: Never Used  Substance Use Topics   Alcohol use: Not Currently    Comment: Occ   Drug use: No     Allergies   Amoxicillin-pot clavulanate, Cauliflower [brassica oleracea], Codeine, Corn-containing products, Cow's milk  [milk (cow)], Gabapentin, Gluten meal, Soy allergy, and Latex   Review of Systems Review of Systems  Genitourinary:  Positive for frequency.  As per HPI   Physical Exam Triage Vital Signs ED Triage Vitals  Encounter Vitals Group     BP 05/14/23 1847 (!) 145/89     Systolic BP Percentile --      Diastolic BP Percentile --      Pulse Rate 05/14/23 1847 68     Resp 05/14/23 1847 16     Temp 05/14/23 1847 97.9 F (36.6 C)     Temp Source 05/14/23 1847 Oral     SpO2 05/14/23 1847 99 %     Weight --      Height --      Head Circumference --      Peak Flow --      Pain Score 05/14/23 1849 2     Pain Loc --      Pain Education --      Exclude from Growth Chart --    No data found.  Updated Vital Signs BP (!) 145/89 (BP Location: Left Arm)   Pulse 68   Temp 97.9 F (36.6 C) (Oral)   Resp 16   SpO2 99%   Visual Acuity Right Eye Distance:   Left Eye Distance:   Bilateral Distance:    Right Eye Near:   Left Eye Near:    Bilateral Near:     Physical Exam Vitals and nursing note reviewed.  Constitutional:      General: She is not in acute distress.    Appearance: Normal appearance. She is normal weight. She is not ill-appearing, toxic-appearing or  diaphoretic.  HENT:     Head: Normocephalic and atraumatic.     Right Ear: Tympanic membrane, ear canal and external ear normal. There is no impacted  cerumen.     Left Ear: Tympanic membrane, ear canal and external ear normal. There is no impacted cerumen.     Nose: Nose normal. No congestion or rhinorrhea.     Mouth/Throat:     Mouth: Mucous membranes are moist.     Pharynx: Oropharynx is clear. No oropharyngeal exudate or posterior oropharyngeal erythema.  Eyes:     General: No scleral icterus.       Right eye: No discharge.        Left eye: No discharge.     Extraocular Movements: Extraocular movements intact.     Conjunctiva/sclera: Conjunctivae normal.     Pupils: Pupils are equal, round, and reactive to light.  Cardiovascular:     Rate and Rhythm: Normal rate and regular rhythm.  Pulmonary:     Effort: Pulmonary effort is normal.  Abdominal:     General: Abdomen is flat. Bowel sounds are normal. There is no distension.     Palpations: Abdomen is soft. There is no mass.     Tenderness: There is no abdominal tenderness. There is no right CVA tenderness, left CVA tenderness, guarding or rebound.     Hernia: No hernia is present.  Musculoskeletal:     Cervical back: Normal range of motion and neck supple. No rigidity or tenderness.     Right lower leg: No edema.     Left lower leg: No edema.  Lymphadenopathy:     Cervical: No cervical adenopathy.  Skin:    General: Skin is warm.     Findings: No bruising, erythema or rash.  Neurological:     General: No focal deficit present.     Mental Status: She is alert. Mental status is at baseline.  Psychiatric:        Mood and Affect: Mood normal.      UC Treatments / Results  Labs (all labs ordered are listed, but only abnormal results are displayed) Labs Reviewed  POCT URINALYSIS DIP (MANUAL ENTRY) - Abnormal; Notable for the following components:      Result Value   Blood, UA small (*)    Protein Ur, POC =30 (*)     Leukocytes, UA Large (3+) (*)    All other components within normal limits  URINE CULTURE    EKG   Radiology No results found.  Procedures Procedures (including critical care time)  Medications Ordered in UC Medications - No data to display  Initial Impression / Assessment and Plan / UC Course  I have reviewed the triage vital signs and the nursing notes.  Pertinent labs & imaging results that were available during my care of the patient were reviewed by me and considered in my medical decision making (see chart for details).     Acute cystitis - sx consistent with simple cystitis. Will send urine culture for confirmation. Start macrobid BID x 5 days. Pt reports that medications made with cornstarch are okay, she has a sensitivity to corn that causes bloating, but admits this medication is fine. Eustachian tube dysfunction -continue using your Flonase.  Will add short course of prednisone.   Final Clinical Impressions(s) / UC Diagnoses   Final diagnoses:  Acute cystitis without hematuria  Eustachian tube dysfunction, bilateral     Discharge Instructions      Please start taking the Macrobid twice daily until gone, for a total of 5 days. Use the Uribel capsule 1 up to 4 times daily as needed.  This will help with discomfort and frequency.  This will change your urine blue while taking. We will only call you with the results of your urine culture should you require a change in treatment.  Please keep your follow-up with urology  Please also take the prednisone, 1 tablet every morning with breakfast.  This should help with the discomfort and pressure in your ears. You may continue using your nasal spray.     ED Prescriptions     Medication Sig Dispense Auth. Provider   nitrofurantoin, macrocrystal-monohydrate, (MACROBID) 100 MG capsule Take 1 capsule (100 mg total) by mouth 2 (two) times daily for 5 days. 10 capsule Torsha Lemus L, PA   Meth-Hyo-M Bl-Na Phos-Ph Sal  (URIBEL) 118 MG CAPS Take 1 capsule (118 mg total) by mouth in the morning, at noon, in the evening, and at bedtime. 60 capsule Jedd Schulenburg L, PA   predniSONE (DELTASONE) 50 MG tablet Take 1 tablet (50 mg total) by mouth daily with breakfast. 5 tablet Eddison Searls L, PA      PDMP not reviewed this encounter.   Maretta Bees, [place] 05/14/23 2024

## 2023-05-14 NOTE — Discharge Instructions (Addendum)
Please start taking the Macrobid twice daily until gone, for a total of 5 days. Use the Uribel capsule 1 up to 4 times daily as needed.  This will help with discomfort and frequency. This will change your urine blue while taking. We will only call you with the results of your urine culture should you require a change in treatment.  Please keep your follow-up with urology  Please also take the prednisone, 1 tablet every morning with breakfast.  This should help with the discomfort and pressure in your ears. You may continue using your nasal spray.

## 2023-05-14 NOTE — ED Triage Notes (Addendum)
Painful urination since Wednesday of last week, has used azo. Took some antibiotic she had left over from a few months ago. Is followed by urology.  Also c/o stuffy ears.

## 2023-05-16 LAB — URINE CULTURE: Culture: <10000 — AB

## 2023-05-17 ENCOUNTER — Telehealth: Payer: Self-pay | Admitting: Physician Assistant

## 2023-05-17 NOTE — Telephone Encounter (Signed)
Okay to use acute slot either today or tomorrow.

## 2023-05-17 NOTE — Telephone Encounter (Signed)
Patient called seen in Urgent Care on September 23rd for UTI she was given MACROBID 100mg  and Prednisone 50mg  for Sinus she is still having pressure and urgency she got a message back from urgent care saying she doesn't have a UTI she is not sure what to do at this point please advise Her pharmacy is [facility] is on [address] [place] [address] Phone [phone]

## 2023-05-18 ENCOUNTER — Ambulatory Visit: Payer: No Typology Code available for payment source | Admitting: Medical-Surgical

## 2023-05-18 DIAGNOSIS — R350 Frequency of micturition: Secondary | ICD-10-CM | POA: Diagnosis not present

## 2023-05-18 DIAGNOSIS — R102 Pelvic and perineal pain: Secondary | ICD-10-CM | POA: Diagnosis not present

## 2023-05-18 DIAGNOSIS — N390 Urinary tract infection, site not specified: Secondary | ICD-10-CM | POA: Diagnosis not present

## 2023-06-18 DIAGNOSIS — N390 Urinary tract infection, site not specified: Secondary | ICD-10-CM | POA: Diagnosis not present

## 2023-06-22 ENCOUNTER — Other Ambulatory Visit: Payer: Self-pay | Admitting: Medical Genetics

## 2023-06-22 DIAGNOSIS — Z006 Encounter for examination for normal comparison and control in clinical research program: Secondary | ICD-10-CM

## 2023-07-24 ENCOUNTER — Other Ambulatory Visit (HOSPITAL_COMMUNITY)
Admission: RE | Admit: 2023-07-24 | Discharge: 2023-07-24 | Disposition: A | Discharge status: Home / Self Care | Payer: No Typology Code available for payment source | Source: Ambulatory Visit | Attending: Oncology | Admitting: Oncology

## 2023-07-24 DIAGNOSIS — Z006 Encounter for examination for normal comparison and control in clinical research program: Secondary | ICD-10-CM | POA: Insufficient documentation

## 2023-07-27 DIAGNOSIS — R102 Pelvic and perineal pain: Secondary | ICD-10-CM | POA: Diagnosis not present

## 2023-07-27 DIAGNOSIS — R1033 Periumbilical pain: Secondary | ICD-10-CM | POA: Diagnosis not present

## 2023-07-27 DIAGNOSIS — R350 Frequency of micturition: Secondary | ICD-10-CM | POA: Diagnosis not present

## 2023-07-31 LAB — GENECONNECT MOLECULAR SCREEN: Genetic Analysis Overall Interpretation: NEGATIVE

## 2023-08-06 DIAGNOSIS — H524 Presbyopia: Secondary | ICD-10-CM | POA: Diagnosis not present

## 2023-08-06 DIAGNOSIS — H43392 Other vitreous opacities, left eye: Secondary | ICD-10-CM | POA: Diagnosis not present

## 2023-08-06 DIAGNOSIS — H5203 Hypermetropia, bilateral: Secondary | ICD-10-CM | POA: Diagnosis not present

## 2023-08-06 DIAGNOSIS — H52223 Regular astigmatism, bilateral: Secondary | ICD-10-CM | POA: Diagnosis not present

## 2023-09-18 ENCOUNTER — Ambulatory Visit (INDEPENDENT_AMBULATORY_CARE_PROVIDER_SITE_OTHER): Payer: No Typology Code available for payment source | Admitting: Physician Assistant

## 2023-09-18 ENCOUNTER — Ambulatory Visit (HOSPITAL_BASED_OUTPATIENT_CLINIC_OR_DEPARTMENT_OTHER): Payer: No Typology Code available for payment source

## 2023-09-18 ENCOUNTER — Encounter: Payer: Self-pay | Admitting: Physician Assistant

## 2023-09-18 VITALS — BP 126/72 | HR 62 | Ht 68.0 in | Wt 135.8 lb

## 2023-09-18 DIAGNOSIS — Z131 Encounter for screening for diabetes mellitus: Secondary | ICD-10-CM | POA: Diagnosis not present

## 2023-09-18 DIAGNOSIS — Z13220 Encounter for screening for lipoid disorders: Secondary | ICD-10-CM

## 2023-09-18 DIAGNOSIS — E559 Vitamin D deficiency, unspecified: Secondary | ICD-10-CM

## 2023-09-18 DIAGNOSIS — J40 Bronchitis, not specified as acute or chronic: Secondary | ICD-10-CM

## 2023-09-18 DIAGNOSIS — Z8041 Family history of malignant neoplasm of ovary: Secondary | ICD-10-CM

## 2023-09-18 DIAGNOSIS — R102 Pelvic and perineal pain: Secondary | ICD-10-CM

## 2023-09-18 DIAGNOSIS — J329 Chronic sinusitis, unspecified: Secondary | ICD-10-CM | POA: Diagnosis not present

## 2023-09-18 DIAGNOSIS — Z0000 Encounter for general adult medical examination without abnormal findings: Secondary | ICD-10-CM | POA: Diagnosis not present

## 2023-09-18 DIAGNOSIS — N390 Urinary tract infection, site not specified: Secondary | ICD-10-CM

## 2023-09-18 DIAGNOSIS — R350 Frequency of micturition: Secondary | ICD-10-CM | POA: Diagnosis not present

## 2023-09-18 LAB — POCT URINALYSIS DIP (CLINITEK)
Bilirubin, UA: NEGATIVE
Blood, UA: NEGATIVE
Glucose, UA: NEGATIVE mg/dL
Ketones, POC UA: NEGATIVE mg/dL
Nitrite, UA: NEGATIVE
POC PROTEIN,UA: NEGATIVE
Spec Grav, UA: >=1.03 — AB (ref 1.010–1.025)
Urobilinogen, UA: 0.2 U/dL
pH, UA: 6 (ref 5.0–8.0)

## 2023-09-18 LAB — POCT UA - MICROALBUMIN
Creatinine, POC: 200 mg/dL
Microalbumin Ur, POC: 80 mg/L

## 2023-09-18 MED ORDER — METHYLPREDNISOLONE 4 MG PO TBPK: ORAL_TABLET | ORAL | 0 refills | Status: DC

## 2023-09-18 MED ORDER — AZITHROMYCIN 250 MG PO TABS: ORAL_TABLET | ORAL | 0 refills | Status: AC

## 2023-09-18 NOTE — Patient Instructions (Addendum)
Will refer to GYN Will get pelvic ultrasound Will get labs today  Acute Bronchitis, Adult  Acute bronchitis is sudden inflammation of the main airways (bronchi) that come off the windpipe (trachea) in the lungs. The swelling causes the airways to get smaller and make more mucus than normal. This can make it hard to breathe and can cause coughing or noisy breathing (wheezing). Acute bronchitis may last several weeks. The cough may last longer. Allergies, asthma, and exposure to smoke may make the condition worse. What are the causes? This condition can be caused by germs and by substances that irritate the lungs, including: Cold and flu viruses. The most common cause of this condition is the virus that causes the common cold. Bacteria. This is less common. Breathing in substances that irritate the lungs, including: Smoke from cigarettes and other forms of tobacco. Dust and pollen. Fumes from household cleaning products, gases, or burned fuel. Indoor or outdoor air pollution. What increases the risk? The following factors may make you more likely to develop this condition: A weak body's defense system, also called the immune system. A condition that affects your lungs and breathing, such as asthma. What are the signs or symptoms? Common symptoms of this condition include: Coughing. This may bring up clear, yellow, or green mucus from your lungs (sputum). Wheezing. Runny or stuffy nose. Having too much mucus in your lungs (chest congestion). Shortness of breath. Aches and pains, including sore throat or chest. How is this diagnosed? This condition is usually diagnosed based on: Your symptoms and medical history. A physical exam. You may also have other tests, including tests to rule out other conditions, such as pneumonia. These tests include: A test of lung function. Test of a mucus sample to look for the presence of bacteria. Tests to check the oxygen level in your blood. Blood  tests. Chest X-ray. How is this treated? Most cases of acute bronchitis clear up over time without treatment. Your health care provider may recommend: Drinking more fluids to help thin your mucus so it is easier to cough up. Taking inhaled medicine (inhaler) to improve air flow in and out of your lungs. Using a vaporizer or a humidifier. These are machines that add water to the air to help you breathe better. Taking a medicine that thins mucus and clears congestion (expectorant). Taking a medicine that prevents or stops coughing (cough suppressant). It is not common to take an antibiotic medicine for this condition. Follow these instructions at home:  Take over-the-counter and prescription medicines only as told by your health care provider. Use an inhaler, vaporizer, or humidifier as told by your health care provider. Take two teaspoons (10 mL) of honey at bedtime to lessen coughing at night. Drink enough fluid to keep your urine pale yellow. Do not use any products that contain nicotine or tobacco. These products include cigarettes, chewing tobacco, and vaping devices, such as e-cigarettes. If you need help quitting, ask your health care provider. Get plenty of rest. Return to your normal activities as told by your health care provider. Ask your health care provider what activities are safe for you. Keep all follow-up visits. This is important. How is this prevented? To lower your risk of getting this condition again: Wash your hands often with soap and water for at least 20 seconds. If soap and water are not available, use hand sanitizer. Avoid contact with people who have cold symptoms. Try not to touch your mouth, nose, or eyes with your hands. Avoid  breathing in smoke or chemical fumes. Breathing smoke or chemical fumes will make your condition worse. Get the flu shot every year. Contact a health care provider if: Your symptoms do not improve after 2 weeks. You have trouble  coughing up the mucus. Your cough keeps you awake at night. You have a fever. Get help right away if you: Cough up blood. Feel pain in your chest. Have severe shortness of breath. Faint or keep feeling like you are going to faint. Have a severe headache. Have a fever or chills that get worse. These symptoms may represent a serious problem that is an emergency. Do not wait to see if the symptoms will go away. Get medical help right away. Call your local emergency services (911 in the [place] Do not drive yourself to the hospital. Summary Acute bronchitis is inflammation of the main airways (bronchi) that come off the windpipe (trachea) in the lungs. The swelling causes the airways to get smaller and make more mucus than normal. Drinking more fluids can help thin your mucus so it is easier to cough up. Take over-the-counter and prescription medicines only as told by your health care provider. Do not use any products that contain nicotine or tobacco. These products include cigarettes, chewing tobacco, and vaping devices, such as e-cigarettes. If you need help quitting, ask your health care provider. Contact a health care provider if your symptoms do not improve after 2 weeks. This information is not intended to replace advice given to you by your health care provider. Make sure you discuss any questions you have with your health care provider. Document Revised: 11/17/2021 Document Reviewed: 12/08/2020 Elsevier Patient Education  2024 [employer]  Health Maintenance After Age 73 After age 61, you are at a higher risk for certain long-term diseases and infections as well as injuries from falls. Falls are a major cause of broken bones and head injuries in people who are older than age 89. Getting regular preventive care can help to keep you healthy and well. Preventive care includes getting regular testing and making lifestyle changes as recommended by your health care provider. Talk with your  health care provider about: Which screenings and tests you should have. A screening is a test that checks for a disease when you have no symptoms. A diet and exercise plan that is right for you. What should I know about screenings and tests to prevent falls? Screening and testing are the best ways to find a health problem early. Early diagnosis and treatment give you the best chance of managing medical conditions that are common after age 57. Certain conditions and lifestyle choices may make you more likely to have a fall. Your health care provider may recommend: Regular vision checks. Poor vision and conditions such as cataracts can make you more likely to have a fall. If you wear glasses, make sure to get your prescription updated if your vision changes. Medicine review. Work with your health care provider to regularly review all of the medicines you are taking, including over-the-counter medicines. Ask your health care provider about any side effects that may make you more likely to have a fall. Tell your health care provider if any medicines that you take make you feel dizzy or sleepy. Strength and balance checks. Your health care provider may recommend certain tests to check your strength and balance while standing, walking, or changing positions. Foot health exam. Foot pain and numbness, as well as not wearing proper footwear, can make you more likely  to have a fall. Screenings, including: Osteoporosis screening. Osteoporosis is a condition that causes the bones to get weaker and break more easily. Blood pressure screening. Blood pressure changes and medicines to control blood pressure can make you feel dizzy. Depression screening. You may be more likely to have a fall if you have a fear of falling, feel depressed, or feel unable to do activities that you used to do. Alcohol use screening. Using too much alcohol can affect your balance and may make you more likely to have a fall. Follow these  instructions at home: Lifestyle Do not drink alcohol if: Your health care provider tells you not to drink. If you drink alcohol: Limit how much you have to: 0-1 drink a day for women. 0-2 drinks a day for men. Know how much alcohol is in your drink. In the [place] one drink equals one 12 oz bottle of beer (355 mL), one 5 oz glass of wine (148 mL), or one 1 oz glass of hard liquor (44 mL). Do not use any products that contain nicotine or tobacco. These products include cigarettes, chewing tobacco, and vaping devices, such as e-cigarettes. If you need help quitting, ask your health care provider. Activity  Follow a regular exercise program to stay fit. This will help you maintain your balance. Ask your health care provider what types of exercise are appropriate for you. If you need a cane or walker, use it as recommended by your health care provider. Wear supportive shoes that have nonskid soles. Safety  Remove any tripping hazards, such as rugs, cords, and clutter. Install safety equipment such as grab bars in bathrooms and safety rails on stairs. Keep rooms and walkways well-lit. General instructions Talk with your health care provider about your risks for falling. Tell your health care provider if: You fall. Be sure to tell your health care provider about all falls, even ones that seem minor. You feel dizzy, tiredness (fatigue), or off-balance. Take over-the-counter and prescription medicines only as told by your health care provider. These include supplements. Eat a healthy diet and maintain a healthy weight. A healthy diet includes low-fat dairy products, low-fat (lean) meats, and fiber from whole grains, beans, and lots of fruits and vegetables. Stay current with your vaccines. Schedule regular health, dental, and eye exams. Summary Having a healthy lifestyle and getting preventive care can help to protect your health and wellness after age 66. Screening and testing are the best way  to find a health problem early and help you avoid having a fall. Early diagnosis and treatment give you the best chance for managing medical conditions that are more common for people who are older than age 83. Falls are a major cause of broken bones and head injuries in people who are older than age 77. Take precautions to prevent a fall at home. Work with your health care provider to learn what changes you can make to improve your health and wellness and to prevent falls. This information is not intended to replace advice given to you by your health care provider. Make sure you discuss any questions you have with your health care provider. Document Revised: 12/27/2020 Document Reviewed: 12/27/2020 Elsevier Patient Education  2024 [employer].

## 2023-09-19 ENCOUNTER — Encounter: Payer: Self-pay | Admitting: Physician Assistant

## 2023-09-19 DIAGNOSIS — R102 Pelvic and perineal pain: Secondary | ICD-10-CM | POA: Insufficient documentation

## 2023-09-19 DIAGNOSIS — Z8041 Family history of malignant neoplasm of ovary: Secondary | ICD-10-CM | POA: Insufficient documentation

## 2023-09-19 DIAGNOSIS — N390 Urinary tract infection, site not specified: Secondary | ICD-10-CM | POA: Insufficient documentation

## 2023-09-19 LAB — LIPID PANEL
Chol/HDL Ratio: 2.8 {ratio} (ref 0.0–4.4)
Cholesterol, Total: 253 mg/dL — ABNORMAL HIGH (ref 100–199)
HDL: 89 mg/dL (ref >39)
LDL Chol Calc (NIH): 152 mg/dL — ABNORMAL HIGH (ref 0–99)
Triglycerides: 74 mg/dL (ref 0–149)
VLDL Cholesterol Cal: 12 mg/dL (ref 5–40)

## 2023-09-19 LAB — CBC WITH DIFFERENTIAL/PLATELET
Basophils Absolute: 0.1 10*3/uL (ref 0.0–0.2)
Basos: 1 %
EOS (ABSOLUTE): 0.1 10*3/uL (ref 0.0–0.4)
Eos: 1 %
Hematocrit: 41.6 % (ref 34.0–46.6)
Hemoglobin: 14 g/dL (ref 11.1–15.9)
Immature Grans (Abs): 0 10*3/uL (ref 0.0–0.1)
Immature Granulocytes: 0 %
Lymphocytes Absolute: 2 10*3/uL (ref 0.7–3.1)
Lymphs: 24 %
MCH: 32.3 pg (ref 26.6–33.0)
MCHC: 33.7 g/dL (ref 31.5–35.7)
MCV: 96 fL (ref 79–97)
Monocytes Absolute: 0.8 10*3/uL (ref 0.1–0.9)
Monocytes: 9 %
Neutrophils Absolute: 5.2 10*3/uL (ref 1.4–7.0)
Neutrophils: 65 %
Platelets: 295 10*3/uL (ref 150–450)
RBC: 4.34 x10E6/uL (ref 3.77–5.28)
RDW: 12.3 % (ref 11.7–15.4)
WBC: 8.1 10*3/uL (ref 3.4–10.8)

## 2023-09-19 LAB — CMP14+EGFR
ALT: 21 [IU]/L (ref 0–32)
AST: 19 [IU]/L (ref 0–40)
Albumin: 4.3 g/dL (ref 3.9–4.9)
Alkaline Phosphatase: 66 [IU]/L (ref 44–121)
BUN/Creatinine Ratio: 25 (ref 12–28)
BUN: 18 mg/dL (ref 8–27)
Bilirubin Total: 0.4 mg/dL (ref 0.0–1.2)
CO2: 23 mmol/L (ref 20–29)
Calcium: 9.4 mg/dL (ref 8.7–10.3)
Chloride: 105 mmol/L (ref 96–106)
Creatinine, Ser: 0.71 mg/dL (ref 0.57–1.00)
Globulin, Total: 2.3 g/dL (ref 1.5–4.5)
Glucose: 89 mg/dL (ref 70–99)
Potassium: 4.9 mmol/L (ref 3.5–5.2)
Sodium: 141 mmol/L (ref 134–144)
Total Protein: 6.6 g/dL (ref 6.0–8.5)
eGFR: 93 mL/min/{1.73_m2} (ref >59)

## 2023-09-19 LAB — CA 125: Cancer Antigen (CA) 125: 9.4 U/mL (ref 0.0–38.1)

## 2023-09-19 LAB — TSH: TSH: 2.25 u[IU]/mL (ref 0.450–4.500)

## 2023-09-19 LAB — VITAMIN D 25 HYDROXY (VIT D DEFICIENCY, FRACTURES): Vit D, 25-Hydroxy: 59.5 ng/mL (ref 30.0–100.0)

## 2023-09-19 NOTE — Progress Notes (Signed)
Complete physical exam  Patient: Lynn Lloyd   DOB: 1956/05/27   68 y.o. Female  MRN: [id number]  Subjective:    Chief Complaint  Patient presents with   Annual Exam    Fasting labs,referral, mood     Lynn Lloyd is a 68 y.o. female who presents today for a complete physical exam. She reports consuming a general diet.  Pt is very active and walks daily. She is currently using weight watchers. She has lost 12lbs in last 6 months.   She generally feels well. She reports sleeping well. She does have additional problems to discuss today.   Pt is concerned with ongoing urinary frequent, frequent UTIs, and pelvic pressure. Seen by urology and suggested daily abx for prevention. She does not want to do this. Last CT was spring 2023. She has family hx of ovarian cancer and worried about cancer. She has failed pelvic floor PT. Would like to go to GYN.   She has had URI symptoms for 3 weeks. She has taken OTC tylenol cold and sinus severe. She is not getting better. She feels like her chest is "rattling" from time to time. No fever, chills, body aches, nausea, vomiting. She is not having any trouble breathing. She does have productive cough and sinus pressure.    Most recent fall risk assessment:    09/11/2022    9:30 AM  Fall Risk   Falls in the past year? 0  Number falls in past yr: 0  Injury with Fall? 0  Risk for fall due to : No Fall Risks  Follow up Falls evaluation completed     Most recent depression screenings:    09/11/2022    9:30 AM 09/09/2021    8:47 AM  PHQ 2/9 Scores  PHQ - 2 Score 0 1  PHQ- 9 Score  7    Vision:Within last year and Dental: No current dental problems and Receives regular dental care  Patient Active Problem List   Diagnosis Date Noted   Pelvic pressure in female 09/19/2023   Family history of ovarian cancer 09/19/2023   Frequent UTI 09/19/2023   Chronic left-sided low back pain with left-sided sciatica 09/11/2022   Vitamin D insufficiency  09/11/2022   Neck pain 09/11/2022   Radicular pain in left arm 09/11/2022   Left lower quadrant abdominal pain 12/26/2021   Skin tag 09/09/2021   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   Elevated LDL cholesterol level 08/27/2020   Lumbar spinal stenosis 08/25/2020   No energy 01/20/2018   Chronic idiopathic constipation 01/18/2018   RLQ abdominal pain 01/18/2018   Bronchospasm 11/15/2017   Allergic rhinitis 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 11/15/2017   Environmental and seasonal allergies 11/15/2017   Encounter for adjustment or management of vascular access device 09/13/2016   Right shoulder pain 03/02/2016   Right knee pain 05/06/2015   Post concussion syndrome 11/07/2013   Abdominal pain, other specified site 10/03/2010   FREQUENCY, URINARY 04/20/2010   Menopausal and postmenopausal disorder 12/15/2009   FINGER PAIN 10/22/2009   PAIN IN JOINT, MULTIPLE SITES 09/28/2009   Tinnitus 07/13/2009   Otalgia 07/13/2009   SOLAR KERATOSIS 03/31/2009   SEBORRHEIC KERATOSIS 08/12/2008   Benign neoplasm of skin 01/09/2008   POSTMENOPAUSAL STATUS 12/06/2007   Other malaise and fatigue 12/06/2007   Viral warts 09/09/2007   SPINAL STENOSIS, CERVICAL 09/09/2007   NEOPLASM, SKIN, UNCERTAIN BEHAVIOR 12/11/2006   Past Medical History:  Diagnosis Date   Chronic kidney disease    Constipation  Endometriosis    Hemorrhoid    Past Surgical History:  Procedure Laterality Date   ADENOIDECTOMY     BUNIONECTOMY  12/11   CHOLECYSTECTOMY     LAPAROSCOPY     endometriosis   SALIVARY GLAND SURGERY     removal   TONSILLECTOMY AND ADENOIDECTOMY     Family History  Problem Relation Age of Onset   Thyroid disease Father    Parkinsonism Father    Cancer Mother        acl   Hypertension Mother    Thyroid disease Mother    Asthma Mother    COPD Mother    Allergic rhinitis Mother    Allergies  Allergen Reactions   Amoxicillin-Pot Clavulanate     "get really spaced out" pharmacist said was due to  clavulanate   Cauliflower [Brassica Oleracea]    Codeine Nausea Only   Corn-Containing Products Other (See Comments)   Cow's Milk  [Milk (Cow)] Cough and Other (See Comments)   Gabapentin     Vision changes   Gluten Meal Other (See Comments)    Bloating   Soy Allergy (Do Not Select) Other (See Comments)    Bloating   Latex Other (See Comments)    whelps      Patient Care Team: Nolene Ebbs as PCP - General (Family Medicine)   Outpatient Medications Prior to Visit  Medication Sig   Bacillus Coagulans-Inulin (PROBIOTIC-PREBIOTIC) 1-250 BILLION-MG CAPS Take 1 capsule by mouth daily.   conjugated estrogens (PREMARIN) vaginal cream Place 1 applicator vaginally once a week.   fluticasone (FLONASE) 50 MCG/ACT nasal spray SPRAY 2 SPRAYS INTO EACH NOSTRIL EVERY DAY   Meth-Hyo-M Bl-Na Phos-Ph Sal (URIBEL) 118 MG CAPS Take 1 capsule (118 mg total) by mouth in the morning, at noon, in the evening, and at bedtime.   [DISCONTINUED] predniSONE (DELTASONE) 50 MG tablet Take 1 tablet (50 mg total) by mouth daily with breakfast.   No facility-administered medications prior to visit.    Review of Systems  All other systems reviewed and are negative.         Objective:     BP 126/72   Pulse 62   Ht 5\' 8"  (1.727 m)   Wt 135 lb 12.8 oz (61.6 kg)   SpO2 99%   BMI 20.65 kg/m  BP Readings from Last 3 Encounters:  09/18/23 126/72  05/14/23 (!) 145/89  09/11/22 120/88   Wt Readings from Last 3 Encounters:  09/18/23 135 lb 12.8 oz (61.6 kg)  09/11/22 145 lb (65.8 kg)  04/26/22 148 lb (67.1 kg)      Physical Exam  BP 126/72   Pulse 62   Ht 5\' 8"  (1.727 m)   Wt 135 lb 12.8 oz (61.6 kg)   SpO2 99%   BMI 20.65 kg/m   General Appearance:    Alert, cooperative, no distress, appears stated age  Head:    Normocephalic, without obvious abnormality, atraumatic  Eyes:    PERRL, conjunctiva/corneas clear, EOM's intact, fundi    benign, both eyes  Ears:    Normal TM's and  external ear canals, both ears  Nose:   Nares normal, septum midline, mucosa erythematous. Turbinates red and swollen with some maxillary sinus tenderness.   Throat:   Lips, mucosa, and tongue normal; teeth and gums normal. PND present.  Neck:   Supple, symmetrical, trachea midline, no adenopathy;    thyroid:  no enlargement/tenderness/nodules; no carotid   bruit or JVD  Back:  Symmetric, no curvature, ROM normal, no CVA tenderness  Lungs:     Clear to auscultation bilaterally, respirations unlabored  Chest Wall:    No tenderness or deformity   Heart:    Regular rate and rhythm, S1 and S2 normal, no murmur, rub   or gallop     Abdomen:     Soft, non-tender, bowel sounds active all four quadrants,    no masses, no organomegaly        Extremities:   Extremities normal, atraumatic, no cyanosis or edema  Pulses:   2+ and symmetric all extremities  Skin:   Skin color, texture, turgor normal, no rashes or lesions  Lymph nodes:   Cervical, supraclavicular, and axillary nodes normal  Neurologic:   CNII-XII intact, normal strength, sensation and reflexes    throughout   Results for orders placed or performed in visit on 09/18/23  POCT URINALYSIS DIP (CLINITEK)  Result Value Ref Range   Color, UA yellow yellow   Clarity, UA clear clear   Glucose, UA negative negative mg/dL   Bilirubin, UA negative negative   Ketones, POC UA negative negative mg/dL   Spec Grav, UA >=4.098 (A) 1.010 - 1.025   Blood, UA negative negative   pH, UA 6.0 5.0 - 8.0   POC PROTEIN,UA negative negative, trace   Urobilinogen, UA 0.2 0.2 or 1.0 E.U./dL   Nitrite, UA Negative Negative   Leukocytes, UA Small (1+) (A) Negative  POCT UA - Microalbumin  Result Value Ref Range   Microalbumin Ur, POC 80 mg/L   Creatinine, POC 200 mg/dL   Albumin/Creatinine Ratio, Urine, POC 30-300        Assessment & Plan:    Routine Health Maintenance and Physical Exam  Immunization History  Administered Date(s) Administered    PFIZER(Purple Top)SARS-COV-2 Vaccination 11/06/2019, 11/27/2019   Td 03/21/2004   Tdap 10/25/2011   Zoster Recombinant(Shingrix) 11/28/2022, 03/05/2023    Health Maintenance  Topic Date Due   Medicare Annual Wellness (AWV)  Never done   Pneumonia Vaccine 70+ Years old (1 of 1 - PCV) Never done   COVID-19 Vaccine (3 - Pfizer risk series) 10/04/2023 (Originally 12/25/2019)   INFLUENZA VACCINE  11/19/2023 (Originally 03/22/2023)   Colonoscopy  02/07/2024   MAMMOGRAM  10/18/2024   DEXA SCAN  Completed   Hepatitis C Screening  Completed   Zoster Vaccines- Shingrix  Completed   HPV VACCINES  Aged Out   DTaP/Tdap/Td  Discontinued    Discussed health benefits of physical activity, and encouraged her to engage in regular exercise appropriate for her age and condition.  Marland KitchenDorann Ou was seen today for annual exam.  Diagnoses and all orders for this visit:  Routine physical examination -     CBC w/Diff/Platelet -     CMP14+EGFR -     Lipid panel -     TSH -     Vitamin D (25 hydroxy) -     US Pelvic Complete With Transvaginal; Future -     CA 125  Frequent UTI -     US Pelvic Complete With Transvaginal; Future -     CA 125 -     Ambulatory referral to Obstetrics / Gynecology -     POCT URINALYSIS DIP (CLINITEK) -     POCT UA - Microalbumin -     Urine Culture  Urinary frequency -     US Pelvic Complete With Transvaginal; Future -     CA 125 -  Ambulatory referral to Obstetrics / Gynecology  Pelvic pressure in female -     US Pelvic Complete With Transvaginal; Future -     CA 125 -     Ambulatory referral to Obstetrics / Gynecology  Family history of ovarian cancer -     US Pelvic Complete With Transvaginal; Future -     CA 125 -     Ambulatory referral to Obstetrics / Gynecology  Vitamin D insufficiency -     Vitamin D (25 hydroxy)  Screening for diabetes mellitus -     CMP14+EGFR  Screening for lipid disorders -     Lipid panel  Sinobronchitis -      azithromycin (ZITHROMAX) 250 MG tablet; Take 2 tablets on day 1, then 1 tablet daily on days 2 through 5 -     methylPREDNISolone (MEDROL DOSEPAK) 4 MG TBPK tablet; Take as directed by package insert.   .. Discussed 150 minutes of exercise a week.  Encouraged vitamin D 1000 units and Calcium 1300mg  or 4 servings of dairy a day.  Fasting labs ordered Mammogram UTD and scheduled for this year Pap not indicated Bone density UTD Colonoscopy UTD Shingles done Discussed pneumonia vaccine but ill today  Sinobronchitis treated with zpak and medrol dose pack Follow up as needed if symptoms persist or worsen  Concern with ongoing urinary symptoms and pelvic pressure U/S ordered of pelvis Concerned for uterine prolapse Referral made to GYN Failed pelvic floor PT Seen by urologist UA done today with no blood and small leukocytes Pt does have family hx of ovarian cancer  Return in about 1 year (around 09/17/2024).     Tandy Gaw, PA-C

## 2023-09-19 NOTE — Progress Notes (Signed)
Curstin,   WBC, hemoglobin look good.  Kidney, liver, glucose look great.  Thyroid normal range.  Vitamin D looks great.  CA125 in normal range.   HDL, good cholesterol, to goal.  LDL, bad cholesterol, elevated and not to goal.   10 year risk is right at the line to consider statin drug for CV prevention. Thoughts? CV risk rising every year.    Marland Kitchen.The 10-year ASCVD risk score (Arnett DK, et al., 2019) is: 7.1%   Values used to calculate the score:     Age: 68 years     Sex: Female     Is Non-Hispanic African American: No     Diabetic: No     Tobacco smoker: No     Systolic Blood Pressure: 126 mmHg     Is BP treated: No     HDL Cholesterol: 89 mg/dL     Total Cholesterol: 253 mg/dL

## 2023-09-20 LAB — URINE CULTURE

## 2023-09-21 NOTE — Progress Notes (Signed)
No concerning bacteria found in urine culture.

## 2023-09-27 ENCOUNTER — Ambulatory Visit: Payer: No Typology Code available for payment source

## 2023-09-27 ENCOUNTER — Other Ambulatory Visit: Payer: Self-pay | Admitting: Physician Assistant

## 2023-09-27 DIAGNOSIS — R102 Pelvic and perineal pain: Secondary | ICD-10-CM | POA: Diagnosis not present

## 2023-09-27 DIAGNOSIS — R350 Frequency of micturition: Secondary | ICD-10-CM

## 2023-09-27 DIAGNOSIS — Z0000 Encounter for general adult medical examination without abnormal findings: Secondary | ICD-10-CM

## 2023-09-27 DIAGNOSIS — H43393 Other vitreous opacities, bilateral: Secondary | ICD-10-CM | POA: Diagnosis not present

## 2023-09-27 DIAGNOSIS — Z8041 Family history of malignant neoplasm of ovary: Secondary | ICD-10-CM

## 2023-09-27 DIAGNOSIS — Z1231 Encounter for screening mammogram for malignant neoplasm of breast: Secondary | ICD-10-CM

## 2023-09-27 DIAGNOSIS — N390 Urinary tract infection, site not specified: Secondary | ICD-10-CM

## 2023-09-27 DIAGNOSIS — N858 Other specified noninflammatory disorders of uterus: Secondary | ICD-10-CM | POA: Diagnosis not present

## 2023-09-28 ENCOUNTER — Encounter: Payer: Self-pay | Admitting: Physician Assistant

## 2023-09-28 NOTE — Progress Notes (Signed)
 Small lesion in the endometrium but looks to be polyp. No acute findings on u/s.

## 2023-10-16 ENCOUNTER — Ambulatory Visit (INDEPENDENT_AMBULATORY_CARE_PROVIDER_SITE_OTHER): Payer: No Typology Code available for payment source

## 2023-10-16 VITALS — Ht 67.0 in | Wt 133.0 lb

## 2023-10-16 DIAGNOSIS — Z0000 Encounter for general adult medical examination without abnormal findings: Secondary | ICD-10-CM | POA: Diagnosis not present

## 2023-10-16 DIAGNOSIS — Z1211 Encounter for screening for malignant neoplasm of colon: Secondary | ICD-10-CM

## 2023-10-16 DIAGNOSIS — Z13820 Encounter for screening for osteoporosis: Secondary | ICD-10-CM

## 2023-10-16 DIAGNOSIS — Z780 Asymptomatic menopausal state: Secondary | ICD-10-CM

## 2023-10-16 NOTE — Patient Instructions (Signed)
  Lynn Lloyd , Thank you for taking time to come for your Medicare Wellness Visit. I appreciate your ongoing commitment to your health goals. Please review the following plan we discussed and let me know if I can assist you in the future.   These are the goals we discussed:  Goals      Activity and Exercise Increased     She would like to do more Yoga.         This is a list of the screening recommended for you and due dates:  Health Maintenance  Topic Date Due   COVID-19 Vaccine (3 - Pfizer risk series) 12/25/2019   Colon Cancer Screening  02/07/2024   Flu Shot  11/19/2023*   Pneumonia Vaccine (1 of 1 - PCV) 09/18/2024*   Medicare Annual Wellness Visit  10/15/2024   Mammogram  10/18/2024   DEXA scan (bone density measurement)  Completed   Hepatitis C Screening  Completed   Zoster (Shingles) Vaccine  Completed   HPV Vaccine  Aged Out   DTaP/Tdap/Td vaccine  Discontinued  *Topic was postponed. The date shown is not the original due date.

## 2023-10-16 NOTE — Progress Notes (Signed)
 Subjective:   Lynn Lloyd is a 68 y.o. female who presents for Medicare Annual (Subsequent) preventive examination.  Visit Complete: Virtual I connected with  Cristina Gong on 10/16/23 by a audio enabled telemedicine application and verified that I am speaking with the correct person using two identifiers.  Patient Location: Home  Provider Location: Office/Clinic  I discussed the limitations of evaluation and management by telemedicine. The patient expressed understanding and agreed to proceed.  Vital Signs: Because this visit was a virtual/telehealth visit, some criteria may be missing or patient reported. Any vitals not documented were not able to be obtained and vitals that have been documented are patient reported.  Patient Medicare AWV questionnaire was completed by the patient on n/a; I have confirmed that all information answered by patient is correct and no changes since this date.  Cardiac Risk Factors include: advanced age (>37men, >46 women)     Objective:    Today's Vitals   10/16/23 1027  Weight: 133 lb (60.3 kg)  Height: 5\' 7"  (1.702 m)   Body mass index is 20.83 kg/m.     10/16/2023   10:38 AM  Advanced Directives  Does Patient Have a Medical Advance Directive? Yes  Type of Advance Directive Living will  Does patient want to make changes to medical advance directive? No - Patient declined    Current Medications (verified) Outpatient Encounter Medications as of 10/16/2023  Medication Sig   Bacillus Coagulans-Inulin (PROBIOTIC-PREBIOTIC) 1-250 BILLION-MG CAPS Take 1 capsule by mouth daily.   cholecalciferol (VITAMIN D3) 25 MCG (1000 UNIT) tablet Take 1,000 Units by mouth daily.   conjugated estrogens (PREMARIN) vaginal cream Place 1 applicator vaginally once a week.   fluticasone (FLONASE) 50 MCG/ACT nasal spray SPRAY 2 SPRAYS INTO EACH NOSTRIL EVERY DAY   vitamin C (ASCORBIC ACID) 250 MG tablet Take 250 mg by mouth daily.   [DISCONTINUED] Meth-Hyo-M  Bl-Na Phos-Ph Sal (URIBEL) 118 MG CAPS Take 1 capsule (118 mg total) by mouth in the morning, at noon, in the evening, and at bedtime.   [DISCONTINUED] methylPREDNISolone (MEDROL DOSEPAK) 4 MG TBPK tablet Take as directed by package insert.   No facility-administered encounter medications on file as of 10/16/2023.    Allergies (verified) Amoxicillin-pot clavulanate, Cauliflower [brassica oleracea], Codeine, Corn-containing products, Cow's milk  [milk (cow)], Gabapentin, Gluten meal, Soy allergy (obsolete), and Latex   History: Past Medical History:  Diagnosis Date   Chronic kidney disease    Constipation    Endometriosis    Hemorrhoid    Past Surgical History:  Procedure Laterality Date   ADENOIDECTOMY     BUNIONECTOMY  12/11   CHOLECYSTECTOMY     LAPAROSCOPY     endometriosis   SALIVARY GLAND SURGERY     removal   TONSILLECTOMY AND ADENOIDECTOMY     Family History  Problem Relation Age of Onset   Thyroid disease Father    Parkinsonism Father    Cancer Mother        acl   Hypertension Mother    Thyroid disease Mother    Asthma Mother    COPD Mother    Allergic rhinitis Mother    Social History   Socioeconomic History   Marital status: Married    Spouse name: Not on file   Number of children: Not on file   Years of education: Not on file   Highest education level: Not on file  Occupational History   Occupation: Print production planner    Employer: [employer]    Tobacco Use   Smoking status: Never   Smokeless tobacco: Never  Vaping Use   Vaping status: Never Used  Substance and Sexual Activity   Alcohol use: Not Currently    Comment: Occ   Drug use: No   Sexual activity: Yes    Partners: Male    Birth control/protection: Post-menopausal  Other Topics Concern   Not on file  Social History Narrative   Ajna lives in her home with her husband. She has 4 children. 1 of her kids live a half mile from her. She walks every day 3 miles. She like to do puzzles.     Social Drivers of Corporate investment banker Strain: Low Risk  (10/16/2023)   Overall Financial Resource Strain (CARDIA)    Difficulty of Paying Living Expenses: Not hard at all  Food Insecurity: No Food Insecurity (10/16/2023)   Hunger Vital Sign    Worried About Running Out of Food in the Last Year: Never true    Ran Out of Food in the Last Year: Never true  Transportation Needs: No Transportation Needs (10/16/2023)   PRAPARE - Administrator, Civil Service (Medical): No    Lack of Transportation (Non-Medical): No  Physical Activity: Sufficiently Active (10/16/2023)   Exercise Vital Sign    Days of Exercise per Week: 7 days    Minutes of Exercise per Session: 60 min  Stress: No Stress Concern Present (10/16/2023)   [employer] of Occupational Health - Occupational Stress Questionnaire    Feeling of Stress : Not at all  Social Connections: Moderately Integrated (10/16/2023)   Social Connection and Isolation Panel [NHANES]    Frequency of Communication with Friends and Family: More than three times a week    Frequency of Social Gatherings with Friends and Family: More than three times a week    Attends Religious Services: Never    Database administrator or Organizations: Yes    Attends Engineer, structural: 1 to 4 times per year    Marital Status: Married    Tobacco Counseling Counseling given: Not Answered   Clinical Intake:  Pre-visit preparation completed: Yes  Pain : No/denies pain     BMI - recorded: 20.83 Nutritional Status: BMI of 19-24  Normal Nutritional Risks: None Diabetes: No  How often do you need to have someone help you when you read instructions, pamphlets, or other written materials from your doctor or pharmacy?: 1 - Never What is the last grade level you completed in school?: 12  Interpreter Needed?: No      Activities of Daily Living    10/16/2023   10:29 AM  In your present state of health, do you have any  difficulty performing the following activities:  Hearing? 0  Vision? 0  Difficulty concentrating or making decisions? 0  Walking or climbing stairs? 0  Dressing or bathing? 0  Doing errands, shopping? 0  Preparing Food and eating ? N  Using the Toilet? N  In the past six months, have you accidently leaked urine? N  Do you have problems with loss of bowel control? N  Managing your Medications? N  Managing your Finances? N  Housekeeping or managing your Housekeeping? N    Patient Care Team: Nolene Ebbs as PCP - General (Family Medicine)  Indicate any recent Medical Services you may have received from other than [facility] providers in the past year (date may be approximate).     Assessment:  This is a routine wellness examination for [facility].  Hearing/Vision screen No results found.   Goals Addressed             This Visit's Progress    Activity and Exercise Increased       She would like to do more Yoga.       Depression Screen    10/16/2023   10:37 AM 09/11/2022    9:30 AM 09/09/2021    8:47 AM 08/25/2020    8:04 AM 06/07/2020    3:30 PM 10/09/2017   11:59 AM  PHQ 2/9 Scores  PHQ - 2 Score 0 0 1 1 0 0  PHQ- 9 Score   7       Fall Risk    10/16/2023   10:38 AM 09/11/2022    9:30 AM 08/25/2020    8:04 AM 06/07/2020    4:10 PM  Fall Risk   Falls in the past year? 0 0 0 0  Number falls in past yr: 0 0 0   Injury with Fall? 0 0 0   Risk for fall due to : No Fall Risks No Fall Risks No Fall Risks No Fall Risks  Follow up Falls evaluation completed Falls evaluation completed Falls evaluation completed     MEDICARE RISK AT HOME: Medicare Risk at Home Any stairs in or around the home?: Yes If so, are there any without handrails?: Yes Home free of loose throw rugs in walkways, pet beds, electrical cords, etc?: Yes Adequate lighting in your home to reduce risk of falls?: Yes Life alert?: No Use of a cane, walker or w/c?: No Grab bars in the bathroom?:  No Shower chair or bench in shower?: No Elevated toilet seat or a handicapped toilet?: No  TIMED UP AND GO:  Was the test performed?  No    Cognitive Function:        10/16/2023   10:40 AM  6CIT Screen  What Year? 0 points  What time? 0 points  Count back from 20 0 points  Months in reverse 0 points  Repeat phrase 0 points    Immunizations Immunization History  Administered Date(s) Administered   PFIZER(Purple Top)SARS-COV-2 Vaccination 11/06/2019, 11/27/2019   Td 03/21/2004   Tdap 10/25/2011   Zoster Recombinant(Shingrix) 11/28/2022, 03/05/2023    TDAP status: Up to date  Flu Vaccine status: Due, Education has been provided regarding the importance of this vaccine. Advised may receive this vaccine at local pharmacy or Health Dept. Aware to provide a copy of the vaccination record if obtained from local pharmacy or Health Dept. Verbalized acceptance and understanding.  Pneumococcal vaccine status: Due, Education has been provided regarding the importance of this vaccine. Advised may receive this vaccine at local pharmacy or Health Dept. Aware to provide a copy of the vaccination record if obtained from local pharmacy or Health Dept. Verbalized acceptance and understanding.  Covid-19 vaccine status: Completed vaccines  Qualifies for Shingles Vaccine? Yes   Zostavax completed No   Shingrix Completed?: Yes  Screening Tests Health Maintenance  Topic Date Due   COVID-19 Vaccine (3 - Pfizer risk series) 12/25/2019   Colonoscopy  02/07/2024   INFLUENZA VACCINE  11/19/2023 (Originally 03/22/2023)   Pneumonia Vaccine 13+ Years old (1 of 1 - PCV) 09/18/2024 (Originally 12/20/2019)   Medicare Annual Wellness (AWV)  10/15/2024   MAMMOGRAM  10/18/2024   DEXA SCAN  Completed   Hepatitis C Screening  Completed   Zoster Vaccines- Shingrix  Completed  HPV VACCINES  Aged Out   DTaP/Tdap/Td  Discontinued    Health Maintenance  Health Maintenance Due  Topic Date Due   COVID-19  Vaccine (3 - Pfizer risk series) 12/25/2019   Colonoscopy  02/07/2024    Colorectal cancer screening: Type of screening: Colonoscopy. Completed 02/06/2014. Repeat every 10 years  Mammogram status: Completed 10/18/2022. Repeat every year  Bone Density status: Completed 02/04/2020. Results reflect: Bone density results: NORMAL. Repeat every 2 years.  Lung Cancer Screening: (Low Dose CT Chest recommended if Age 38-80 years, 20 pack-year currently smoking OR have quit w/in 15years.) does not qualify.   Lung Cancer Screening Referral: n/a  Additional Screening:  Hepatitis C Screening: does qualify; Completed 08/25/2020  Vision Screening: Recommended annual ophthalmology exams for early detection of glaucoma and other disorders of the eye. Is the patient up to date with their annual eye exam?  Yes  Who is the provider or what is the name of the office in which the patient attends annual eye exams? [email or web address] If pt is not established with a provider, would they like to be referred to a provider to establish care?  N/a .   Dental Screening: Recommended annual dental exams for proper oral hygiene    Community Resource Referral / Chronic Care Management: CRR required this visit?  Yes   CCM required this visit?  No     Plan:     I have personally reviewed and noted the following in the patient's chart:   Medical and social history Use of alcohol, tobacco or illicit drugs  Current medications and supplements including opioid prescriptions. Patient is not currently taking opioid prescriptions. Functional ability and status Nutritional status Physical activity Advanced directives List of other physicians Hospitalizations, surgeries, and ER visits in previous 12 months Vitals Screenings to include cognitive, depression, and falls Referrals and appointments  In addition, I have reviewed and discussed with patient certain preventive protocols, quality metrics, and best practice  recommendations. A written personalized care plan for preventive services as well as general preventive health recommendations were provided to patient.     Esmond Harps, [place]   10/16/2023   After Visit Summary: (MyChart) Due to this being a telephonic visit, the after visit summary with patients personalized plan was offered to patient via MyChart   Nurse Notes:   MICHI HERRMANN is a 68 y.o. female  patient of Tandy Gaw, PA-C who had a Medicare Annual Wellness Visit today via telephone. Panhia is Retired and lives with their spouse. She has 4 children. She reports that she is socially active and does interact with friends/family regularly.   Referral for a colonoscopy.   Ordered bone density.   Mammogram scheduled for 12/05/2023

## 2023-10-24 ENCOUNTER — Ambulatory Visit: Payer: No Typology Code available for payment source

## 2023-10-24 DIAGNOSIS — Z780 Asymptomatic menopausal state: Secondary | ICD-10-CM

## 2023-10-24 DIAGNOSIS — Z1231 Encounter for screening mammogram for malignant neoplasm of breast: Secondary | ICD-10-CM | POA: Diagnosis not present

## 2023-10-24 DIAGNOSIS — Z13820 Encounter for screening for osteoporosis: Secondary | ICD-10-CM | POA: Diagnosis not present

## 2023-10-24 DIAGNOSIS — M85852 Other specified disorders of bone density and structure, left thigh: Secondary | ICD-10-CM | POA: Diagnosis not present

## 2023-10-29 ENCOUNTER — Encounter: Payer: Self-pay | Admitting: Physician Assistant

## 2023-10-29 DIAGNOSIS — M8580 Other specified disorders of bone density and structure, unspecified site: Secondary | ICD-10-CM | POA: Insufficient documentation

## 2023-10-29 NOTE — Progress Notes (Signed)
 Lynn Lloyd,   Your bone density shows osteopenia( low bone mass). Will recheck in 2 years. Make sure you are staying on vitamin D and calcium at least 500mg  twice a day. Continue to exercise!

## 2023-10-29 NOTE — Progress Notes (Signed)
 Normal mammogram. Follow up in 1 year.

## 2023-11-05 ENCOUNTER — Ambulatory Visit (AMBULATORY_SURGERY_CENTER): Payer: No Typology Code available for payment source

## 2023-11-05 VITALS — Ht 67.0 in | Wt 134.0 lb

## 2023-11-05 DIAGNOSIS — Z1211 Encounter for screening for malignant neoplasm of colon: Secondary | ICD-10-CM

## 2023-11-05 MED ORDER — SUFLAVE 178.7 G PO SOLR: 1.0000 | Freq: Once | ORAL | 0 refills | Status: AC

## 2023-11-05 NOTE — Progress Notes (Signed)
 No egg known to patient  Soy allergy (bloating) No issues known to pt with past sedation with any surgeries or procedures Patient denies ever being told they had issues or difficulty with intubation  No FH of Malignant Hyperthermia Pt is not on diet pills Pt is not on  home 02  Pt is not on blood thinners  Pt denies issues with constipation  No A fib or A flutter Have any cardiac testing pending--No Pt can ambulate  Pt denies use of chewing tobacco Discussed diabetic I weight loss medication holds Discussed NSAID holds Checked BMI Pt instructed to use Singlecare.com or GoodRx for a price reduction on prep  Patient's chart reviewed by Cathlyn Parsons CNRA prior to previsit and patient appropriate for the [facility].  Pre visit completed and red dot placed by patient's name on their procedure day (on provider's schedule).

## 2023-11-28 DIAGNOSIS — L821 Other seborrheic keratosis: Secondary | ICD-10-CM | POA: Diagnosis not present

## 2023-11-28 DIAGNOSIS — L820 Inflamed seborrheic keratosis: Secondary | ICD-10-CM | POA: Diagnosis not present

## 2023-11-28 DIAGNOSIS — L578 Other skin changes due to chronic exposure to nonionizing radiation: Secondary | ICD-10-CM | POA: Diagnosis not present

## 2023-11-28 DIAGNOSIS — D225 Melanocytic nevi of trunk: Secondary | ICD-10-CM | POA: Diagnosis not present

## 2023-12-02 ENCOUNTER — Encounter: Payer: Self-pay | Admitting: Certified Registered Nurse Anesthetist

## 2023-12-04 ENCOUNTER — Encounter: Payer: Self-pay | Admitting: Gastroenterology

## 2023-12-05 DIAGNOSIS — Z1231 Encounter for screening mammogram for malignant neoplasm of breast: Secondary | ICD-10-CM

## 2023-12-06 ENCOUNTER — Ambulatory Visit (AMBULATORY_SURGERY_CENTER): Payer: No Typology Code available for payment source | Admitting: Gastroenterology

## 2023-12-06 ENCOUNTER — Encounter: Payer: Self-pay | Admitting: Gastroenterology

## 2023-12-06 VITALS — BP 95/74 | HR 53 | Temp 98.1°F | Resp 14 | Ht 67.0 in | Wt 134.0 lb

## 2023-12-06 DIAGNOSIS — D123 Benign neoplasm of transverse colon: Secondary | ICD-10-CM | POA: Diagnosis not present

## 2023-12-06 DIAGNOSIS — K5730 Diverticulosis of large intestine without perforation or abscess without bleeding: Secondary | ICD-10-CM

## 2023-12-06 DIAGNOSIS — Z1211 Encounter for screening for malignant neoplasm of colon: Secondary | ICD-10-CM | POA: Diagnosis not present

## 2023-12-06 DIAGNOSIS — K648 Other hemorrhoids: Secondary | ICD-10-CM

## 2023-12-06 MED ORDER — SODIUM CHLORIDE 0.9 % IV SOLN
500.0000 mL | INTRAVENOUS | Status: DC
Start: 2023-12-06 — End: 2023-12-06

## 2023-12-06 NOTE — Progress Notes (Signed)
 History and Physical:  This patient presents for endoscopic testing for: Encounter Diagnosis  Name Primary?   Special screening for malignant neoplasms, colon Yes    Average risk for colorectal cancer.  3rd screening exam.   (Reports no polyps 10 yrs ago at outside GI clinic and none 10 yrs prior to that) Patient denies chronic abdominal pain, rectal bleeding, constipation or diarrhea.  Patient is otherwise without complaints or active issues today.   Past Medical History: Past Medical History:  Diagnosis Date   Chronic kidney disease    Constipation    Endometriosis    Hemorrhoid      Past Surgical History: Past Surgical History:  Procedure Laterality Date   ADENOIDECTOMY     BUNIONECTOMY  12/11   CHOLECYSTECTOMY     LAPAROSCOPY     endometriosis   SALIVARY GLAND SURGERY     removal   TONSILLECTOMY AND ADENOIDECTOMY      Allergies: Allergies  Allergen Reactions   Gabapentin Other (See Comments)    Vision changes   Amoxicillin-Pot Clavulanate Other (See Comments)    "get really spaced out" pharmacist said was due to clavulanate   Codeine Nausea Only   Gluten Meal Other (See Comments)    Bloating   Cauliflower [Brassica Oleracea] Other (See Comments)   Corn-Containing Products Other (See Comments)   Cow's Milk  [Milk (Cow)] Other (See Comments) and Cough   Latex Other (See Comments)    whelps   Soy Allergy (Obsolete) Other (See Comments)    Bloating    Outpatient Meds: Current Outpatient Medications  Medication Sig Dispense Refill   Bacillus Coagulans-Inulin (PROBIOTIC-PREBIOTIC) 1-250 BILLION-MG CAPS Take 1 capsule by mouth daily.     Biotin 1 MG CAPS Take 1 capsule by mouth daily.     cetirizine (ZYRTEC) 10 MG tablet Take 10 mg by mouth daily.     cholecalciferol (VITAMIN D3) 25 MCG (1000 UNIT) tablet Take 1,000 Units by mouth daily.     conjugated estrogens (PREMARIN) vaginal cream Place 1 applicator vaginally once a week.     Magnesium Glycinate 100  MG CAPS Take 1 capsule by mouth daily.     vitamin C (ASCORBIC ACID) 250 MG tablet Take 250 mg by mouth daily.     zinc gluconate 50 MG tablet Take 50 mg by mouth daily.     fluticasone (FLONASE) 50 MCG/ACT nasal spray SPRAY 2 SPRAYS INTO EACH NOSTRIL EVERY DAY (Patient not taking: Reported on 11/05/2023) 48 mL 1   Current Facility-Administered Medications  Medication Dose Route Frequency Provider Last Rate Last Admin   0.9 %  sodium chloride infusion  500 mL Intravenous Continuous Kerby Pearson III, MD          ___________________________________________________________________ Objective   Exam:  BP 127/78   Pulse 68   Temp 98.1 F (36.7 C) (Temporal)   Ht 5\' 7"  (1.702 m)   Wt 134 lb (60.8 kg)   SpO2 100%   BMI 20.99 kg/m   CV: regular , S1/S2 Resp: clear to auscultation bilaterally, normal RR and effort noted GI: soft, no tenderness, with active bowel sounds.   Assessment: Encounter Diagnosis  Name Primary?   Special screening for malignant neoplasms, colon Yes     Plan: Colonoscopy   The benefits and risks of the planned procedure(s) were described in detail with the patient or (when appropriate) their health care proxy.  Risks were outlined as including, but not limited to, bleeding, infection, perforation, adverse medication reaction  leading to cardiac or pulmonary decompensation, pancreatitis (if ERCP).  The limitation of incomplete mucosal visualization was also discussed.  No guarantees or warranties were given.  The patient is appropriate for an endoscopic procedure in the ambulatory setting.   - Lorella Roles, MD

## 2023-12-06 NOTE — Progress Notes (Signed)
 Report given to PACU, vss

## 2023-12-06 NOTE — Progress Notes (Signed)
 Called to room to assist during endoscopic procedure.  Patient ID and intended procedure confirmed with present staff. Received instructions for my participation in the procedure from the performing physician.

## 2023-12-06 NOTE — Op Note (Signed)
 [facility] Patient Name: Lynn Lloyd Procedure Date: 12/06/2023 11:03 AM MRN: [id number] Endoscopist: Sherilyn Cooter L. Myrtie Neither , MD, [id number] Age: 68 Referring MD:  Date of Birth: [birth year] Gender: Female Account #: [id number] Procedure:                Colonoscopy Indications:              Screening for colorectal malignant neoplasm                           reportedly no polyps on colonoscopies 10 and 20 yrs                            ago at outside practices Medicines:                Monitored Anesthesia Care Procedure:                Pre-Anesthesia Assessment:                           - Prior to the procedure, a History and Physical                            was performed, and patient medications and                            allergies were reviewed. The patient's tolerance of                            previous anesthesia was also reviewed. The risks                            and benefits of the procedure and the sedation                            options and risks were discussed with the patient.                            All questions were answered, and informed consent                            was obtained. Prior Anticoagulants: The patient has                            taken no anticoagulant or antiplatelet agents. ASA                            Grade Assessment: I - A normal, healthy patient.                            After reviewing the risks and benefits, the patient                            was deemed in satisfactory condition to undergo the  procedure.                           After obtaining informed consent, the colonoscope                            was passed under direct vision. Throughout the                            procedure, the patient's blood pressure, pulse, and                            oxygen saturations were monitored continuously. The                            CF [id number] #[id number] was introduced through the  anus                            and advanced to the the cecum, identified by                            appendiceal orifice and ileocecal valve. The                            colonoscopy was somewhat difficult due to a                            redundant colon. Successful completion of the                            procedure was aided by using manual pressure and                            straightening and shortening the scope to obtain                            bowel loop reduction. The patient tolerated the                            procedure well. The quality of the bowel                            preparation was excellent. The ileocecal valve,                            appendiceal orifice, and rectum were photographed.                            The bowel preparation used was SUPREP. Scope In: 11:06:48 AM Scope Out: 11:21:09 AM Scope Withdrawal Time: 0 hours 9 minutes 50 seconds  Total Procedure Duration: 0 hours 14 minutes 21 seconds  Findings:                 The perianal and digital rectal examinations were  normal.                           Repeat examination of right colon under NBI                            performed.                           Diverticula were found in the left colon.                           A 4 mm polyp was found in the proximal transverse                            colon. The polyp was sessile. The polyp was removed                            with a cold snare. Resection and retrieval were                            complete.                           Internal hemorrhoids were found. The hemorrhoids                            were small.                           The exam was otherwise without abnormality on                            direct and retroflexion views. Complications:            No immediate complications. Estimated Blood Loss:     Estimated blood loss was minimal. Impression:               - Diverticulosis  in the left colon.                           - One 4 mm polyp in the proximal transverse colon,                            removed with a cold snare. Resected and retrieved.                           - Internal hemorrhoids.                           - The examination was otherwise normal on direct                            and retroflexion views. Recommendation:           - Patient has a contact number available for  emergencies. The signs and symptoms of potential                            delayed complications were discussed with the                            patient. Return to normal activities tomorrow.                            Written discharge instructions were provided to the                            patient.                           - Resume previous diet.                           - Continue present medications.                           - Await pathology results.                           - Repeat colonoscopy is recommended for                            surveillance. The colonoscopy date will be                            determined after pathology results from today's                            exam become available for review. Shaheen Star L. Dominic Friendly, MD 12/06/2023 11:25:02 AM This report has been signed electronically.

## 2023-12-06 NOTE — Patient Instructions (Signed)
Resume previous diet Continue present medications Await pathology results Handouts/ information given for polyps, diverticulosis and hemorrhoids  YOU HAD AN ENDOSCOPIC PROCEDURE TODAY AT THE [facility]:   Refer to the procedure report that was given to you for any specific questions about what was found during the examination.  If the procedure report does not answer your questions, please call your gastroenterologist to clarify.  If you requested that your care partner not be given the details of your procedure findings, then the procedure report has been included in a sealed envelope for you to review at your convenience later.  YOU SHOULD EXPECT: Some feelings of bloating in the abdomen. Passage of more gas than usual.  Walking can help get rid of the air that was put into your GI tract during the procedure and reduce the bloating. If you had a lower endoscopy (such as a colonoscopy or flexible sigmoidoscopy) you may notice spotting of blood in your stool or on the toilet paper. If you underwent a bowel prep for your procedure, you may not have a normal bowel movement for a few days.  Please Note:  You might notice some irritation and congestion in your nose or some drainage.  This is from the oxygen used during your procedure.  There is no need for concern and it should clear up in a day or so.  SYMPTOMS TO REPORT IMMEDIATELY:  Following lower endoscopy (colonoscopy or flexible sigmoidoscopy):  Excessive amounts of blood in the stool  Significant tenderness or worsening of abdominal pains  Swelling of the abdomen that is new, acute  Fever of 100F or higher  For urgent or emergent issues, a gastroenterologist can be reached at any hour by calling [phone]. Do not use MyChart messaging for urgent concerns.    DIET:  We do recommend a small meal at first, but then you may proceed to your regular diet.  Drink plenty of fluids but you should avoid alcoholic beverages for 24  hours.  ACTIVITY:  You should plan to take it easy for the rest of today and you should NOT DRIVE or use heavy machinery until tomorrow (because of the sedation medicines used during the test).    FOLLOW UP: Our staff will call the number listed on your records the next business day following your procedure.  We will call around 7:15- 8:00 am to check on you and address any questions or concerns that you may have regarding the information given to you following your procedure. If we do not reach you, we will leave a message.     If any biopsies were taken you will be contacted by phone or by letter within the next 1-3 weeks.  Please call us at [phone] if you have not heard about the biopsies in 3 weeks.    SIGNATURES/CONFIDENTIALITY: You and/or your care partner have signed paperwork which will be entered into your electronic medical record.  These signatures attest to the fact that that the information above on your After Visit Summary has been reviewed and is understood.  Full responsibility of the confidentiality of this discharge information lies with you and/or your care-partner. 

## 2023-12-06 NOTE — Progress Notes (Signed)
 Pt's states no medical or surgical changes since previsit or office visit.

## 2023-12-10 ENCOUNTER — Telehealth: Payer: Self-pay

## 2023-12-10 NOTE — Telephone Encounter (Signed)
  Follow up Call-     12/06/2023   10:02 AM  Call back number  Post procedure Call Back phone  # [phone]  Permission to leave phone message Yes     Patient questions:  Do you have a fever, pain , or abdominal swelling? No. Pain Score  0 *  Have you tolerated food without any problems? Yes.    Have you been able to return to your normal activities? Yes.    Do you have any questions about your discharge instructions: Diet   No. Medications  No. Follow up visit  No.  Do you have questions or concerns about your Care? No.  Actions: * If pain score is 4 or above: No action needed, pain <4.

## 2023-12-11 LAB — SURGICAL PATHOLOGY

## 2023-12-12 ENCOUNTER — Encounter: Payer: Self-pay | Admitting: Gastroenterology

## 2023-12-18 ENCOUNTER — Ambulatory Visit
Admission: RE | Admit: 2023-12-18 | Discharge: 2023-12-18 | Disposition: A | Discharge status: Home / Self Care | Source: Ambulatory Visit | Attending: Family Medicine | Admitting: Family Medicine

## 2023-12-18 VITALS — BP 135/78 | HR 69 | Temp 98.2°F | Resp 16

## 2023-12-18 DIAGNOSIS — N3000 Acute cystitis without hematuria: Secondary | ICD-10-CM

## 2023-12-18 LAB — POCT URINALYSIS DIP (MANUAL ENTRY)
Bilirubin, UA: NEGATIVE
Blood, UA: NEGATIVE
Glucose, UA: NEGATIVE mg/dL
Ketones, POC UA: NEGATIVE mg/dL — AB
Leukocytes, UA: NEGATIVE
Nitrite, UA: POSITIVE — AB
Spec Grav, UA: 1.02 (ref 1.010–1.025)
Urobilinogen, UA: 0.2 U/dL
pH, UA: 6 (ref 5.0–8.0)

## 2023-12-18 MED ORDER — CEFDINIR 300 MG PO CAPS: 300.0000 mg | ORAL_CAPSULE | Freq: Two times a day (BID) | ORAL | 0 refills | Status: AC

## 2023-12-18 NOTE — ED Triage Notes (Signed)
 Pt presents to [facility] with pressure and pain when urinating for a few weeks with new onset of body aches yesterday. Pt reports she took azo this morning.

## 2023-12-18 NOTE — ED Provider Notes (Signed)
 [facility]    CSN: [id number] Arrival date & time: 12/18/23  1830      History   Chief Complaint Chief Complaint  Patient presents with   Urinary Frequency    Entered by patient    HPI Lynn Lloyd is a 68 y.o. female.   HPI oh 68 year old female presents with urinary pressure and pain for 3 weeks.  PMH significant for frequent UTI, pelvic pressure in female and chronic kidney disease.  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   Elevated LDL cholesterol level 08/27/2020   Lumbar spinal stenosis 08/25/2020   No energy 01/20/2018   Chronic idiopathic constipation 01/18/2018   RLQ abdominal pain 01/18/2018   Bronchospasm 11/15/2017   Allergic rhinitis 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 11/15/2017   Environmental and seasonal allergies 11/15/2017   Encounter for adjustment or management of vascular access device 09/13/2016   Right shoulder pain 03/02/2016   Right knee pain 05/06/2015   Post concussion syndrome 11/07/2013   Abdominal pain, other specified site 10/03/2010   FREQUENCY, URINARY 04/20/2010   Menopausal and postmenopausal disorder 12/15/2009   FINGER PAIN 10/22/2009   PAIN IN JOINT, MULTIPLE SITES 09/28/2009   Tinnitus 07/13/2009   Otalgia 07/13/2009   SOLAR KERATOSIS 03/31/2009   SEBORRHEIC KERATOSIS 08/12/2008   Benign neoplasm of skin 01/09/2008   POSTMENOPAUSAL STATUS 12/06/2007   Other malaise and fatigue  12/06/2007   Viral warts 09/09/2007   SPINAL STENOSIS, CERVICAL 09/09/2007   NEOPLASM, SKIN, UNCERTAIN BEHAVIOR 12/11/2006    Past Surgical History:  Procedure Laterality Date   ADENOIDECTOMY     BUNIONECTOMY  12/11   CHOLECYSTECTOMY     LAPAROSCOPY     endometriosis   SALIVARY GLAND SURGERY     removal   TONSILLECTOMY AND ADENOIDECTOMY      OB History     Gravida  3   Para  3   Term  3   Preterm      AB      Living  4      SAB      IAB      Ectopic      Multiple  1   Live Births               Home Medications    Prior to Admission medications   Medication Sig Start Date End Date Taking? Authorizing Provider  cefdinir (OMNICEF) 300 MG capsule Take 1 capsule (300 mg total) by mouth 2 (two) times daily for 7 days. 12/18/23 12/25/23 Yes Leonides Ramp, FNP  Bacillus Coagulans-Inulin (PROBIOTIC-PREBIOTIC) 1-250 BILLION-MG CAPS Take 1 capsule by mouth daily.    [provider]  Biotin 1 MG CAPS Take 1 capsule by mouth daily.    [provider]  cetirizine (ZYRTEC) 10 MG tablet Take 10 mg by mouth daily.    [provider]  cholecalciferol (VITAMIN D3) 25 MCG (  1000 UNIT) tablet Take 1,000 Units by mouth daily.    [provider]  conjugated estrogens (PREMARIN) vaginal cream Place 1 applicator vaginally once a week.    [provider]  fluticasone  (FLONASE ) 50 MCG/ACT nasal spray SPRAY 2 SPRAYS INTO EACH NOSTRIL EVERY DAY Patient not taking: Reported on 11/05/2023 03/23/22   Breeback, Jade L, PA-C  Magnesium Glycinate 100 MG CAPS Take 1 capsule by mouth daily.    [provider]  vitamin C (ASCORBIC ACID) 250 MG tablet Take 250 mg by mouth daily.    [provider]  zinc gluconate 50 MG tablet Take 50 mg by mouth daily.    [provider]    Family History Family History  Problem Relation Age of Onset   Cancer Mother        acl   Hypertension Mother    Thyroid  disease Mother    Asthma  Mother    COPD Mother    Allergic rhinitis Mother    Thyroid  disease Father    Parkinsonism Father    Colon cancer Maternal Aunt    Esophageal cancer Paternal Grandmother    Rectal cancer Neg Hx    Stomach cancer Neg Hx     Social History Social History   Tobacco Use   Smoking status: Never   Smokeless tobacco: Never  Vaping Use   Vaping status: Never Used  Substance Use Topics   Alcohol use: Yes    Comment: Occ wine   Drug use: No     Allergies   Gabapentin , Amoxicillin -pot clavulanate, Codeine, Gluten meal, Cauliflower [brassica oleracea], Corn-containing products, Cow's milk  [milk (cow)], Latex, and Soy allergy (obsolete)   Review of Systems Review of Systems  Genitourinary:  Positive for frequency.  All other systems reviewed and are negative.    Physical Exam Triage Vital Signs ED Triage Vitals  Encounter Vitals Group     BP 12/18/23 1846 135/78     Systolic BP Percentile --      Diastolic BP Percentile --      Pulse Rate 12/18/23 1846 69     Resp 12/18/23 1846 16     Temp 12/18/23 1846 98.2 F (36.8 C)     Temp src --      SpO2 12/18/23 1846 98 %     Weight --      Height --      Head Circumference --      Peak Flow --      Pain Score 12/18/23 1845 3     Pain Loc --      Pain Education --      Exclude from Growth Chart --    No data found.  Updated Vital Signs BP 135/78   Pulse 69   Temp 98.2 F (36.8 C)   Resp 16   SpO2 98%   Visual Acuity Right Eye Distance:   Left Eye Distance:   Bilateral Distance:    Right Eye Near:   Left Eye Near:    Bilateral Near:     Physical Exam Vitals and nursing note reviewed.  Constitutional:      Appearance: Normal appearance. She is normal weight.  HENT:     Head: Normocephalic and atraumatic.     Mouth/Throat:     Mouth: Mucous membranes are moist.     Pharynx: Oropharynx is clear.  Eyes:     Extraocular Movements: Extraocular movements intact.     Conjunctiva/sclera: Conjunctivae  normal.  Pupils: Pupils are equal, round, and reactive to light.  Cardiovascular:     Rate and Rhythm: Normal rate and regular rhythm.     Pulses: Normal pulses.     Heart sounds: Normal heart sounds.  Pulmonary:     Effort: Pulmonary effort is normal.     Breath sounds: Normal breath sounds. No wheezing, rhonchi or rales.  Abdominal:     Tenderness: There is no right CVA tenderness or left CVA tenderness.  Musculoskeletal:     Cervical back: Normal range of motion and neck supple.  Skin:    General: Skin is warm and dry.  Neurological:     General: No focal deficit present.     Mental Status: She is alert and oriented to person, place, and time. Mental status is at baseline.  Psychiatric:        Mood and Affect: Mood normal.        Behavior: Behavior normal.      UC Treatments / Results  Labs (all labs ordered are listed, but only abnormal results are displayed) Labs Reviewed  POCT URINALYSIS DIP (MANUAL ENTRY) - Abnormal; Notable for the following components:      Result Value   Ketones, POC UA negative (*)    Protein Ur, POC trace (*)    Nitrite, UA Positive (*)    All other components within normal limits  URINE CULTURE    EKG   Radiology No results found.  Procedures Procedures (including critical care time)  Medications Ordered in UC Medications - No data to display  Initial Impression / Assessment and Plan / UC Course  I have reviewed the triage vital signs and the nursing notes.  Pertinent labs & imaging results that were available during my care of the patient were reviewed by me and considered in my medical decision making (see chart for details).     MDM: 1.  Acute cystitis without hematuria-UA revealed above, urine culture ordered, Rx'd cefdinir 300 mg capsule: Take 1 capsule twice daily x 7 days. Advised patient to take medication as directed with food to completion.  Encouraged to increase daily water intake to 64 ounces per day while taking  this medication.  Advised we will follow-up with urine culture results once received.  Advised if symptoms worsen and/or unresolved please follow-up with your PCP, [facility], or here for further evaluation.  Patient discharged home, hemodynamically stable. Final Clinical Impressions(s) / UC Diagnoses   Final diagnoses:  Acute cystitis without hematuria     Discharge Instructions      Advised patient to take medication as directed with food to completion.  Encouraged to increase daily water intake to 64 ounces per day while taking this medication.  Advised we will follow-up with urine culture results once received.  Advised if symptoms worsen and/or unresolved please follow-up with your PCP, [facility], or here for further evaluation.     ED Prescriptions     Medication Sig Dispense Auth. Provider   cefdinir (OMNICEF) 300 MG capsule Take 1 capsule (300 mg total) by mouth 2 (two) times daily for 7 days. 14 capsule Mariona Scholes, FNP      PDMP not reviewed this encounter.   Leonides Ramp, FNP 12/18/23 1920

## 2023-12-18 NOTE — Discharge Instructions (Addendum)
 Advised patient to take medication as directed with food to completion.  Encouraged to increase daily water intake to 64 ounces per day while taking this medication.  Advised we will follow-up with urine culture results once received.  Advised if symptoms worsen and/or unresolved please follow-up with your PCP, [facility] urology, or here for further evaluation.

## 2023-12-20 LAB — URINE CULTURE: Culture: NO GROWTH

## 2024-04-11 ENCOUNTER — Ambulatory Visit: Payer: Self-pay

## 2024-04-11 NOTE — Telephone Encounter (Signed)
 FYI Only or Action Required?: FYI only for provider.  Patient was last seen in primary care on 09/18/2023 by Antoniette Vermell CROME, PA-C.  Called Nurse Triage reporting Back Pain and Spot under tongue.  Symptoms began several weeks ago.  Interventions attempted: Rest, hydration, or home remedies.  Symptoms are: unchanged.  Triage Disposition: See PCP Within 2 Weeks  Patient/caregiver understands and will follow disposition?: Yes  Copied from CRM [id number]. Topic: Clinical - Red Word Triage >> Apr 11, 2024  3:32 PM Kevelyn M wrote: Red Word that prompted transfer to Nurse Triage: Chronic Back pain, spot under her tongue. Reason for Disposition  All other mouth symptoms  (Exceptions: Dry mouth from not drinking enough liquids, chapped lips.)  Back pain present > 2 weeks  Answer Assessment - Initial Assessment Questions 1. ONSET: When did the pain begin? (e.g., minutes, hours, days)     Started over the last week 2. LOCATION: Where does it hurt? (upper, mid or lower back)     Lower back 3. SEVERITY: How bad is the pain?  (e.g., Scale 1-10; mild, moderate, or severe)     3 out of 10 4. PATTERN: Is the pain constant? (e.g., yes, no; constant, intermittent)      intermittent 5. RADIATION: Does the pain shoot into your legs or somewhere else?     no 6. CAUSE:  What do you think is causing the back pain?      unsure 7. BACK OVERUSE:  Any recent lifting of heavy objects, strenuous work or exercise?     no 8. MEDICINES: What have you taken so far for the pain? (e.g., nothing, acetaminophen , NSAIDS)     ibuprofen 9. NEUROLOGIC SYMPTOMS: Do you have any weakness, numbness, or problems with bowel/bladder control?     no 10. OTHER SYMPTOMS: Do you have any other symptoms? (e.g., fever, abdomen pain, burning with urination, blood in urine)       no  Answer Assessment - Initial Assessment Questions 1. SYMPTOM: What's the main symptom you're concerned about? (e.g., chapped  lips, dry mouth, lump, sores)     Red and raw area under her tongue 2. ONSET: When did the  spot under tongue  start?     Started several weeks ago 3. PAIN: Is there any pain? If Yes, ask: How bad is it? (Scale: 0-10; or none, mild, moderate, severe)     mild 4. CAUSE: What do you think is causing the symptoms?     unsure 5. OTHER SYMPTOMS: Do you have any other symptoms? (e.g., fever, sore throat, toothache, swelling)     Pain to back of ear.  Protocols used: Back Pain-A-AH, Mouth Symptoms-A-AH

## 2024-04-16 ENCOUNTER — Ambulatory Visit (INDEPENDENT_AMBULATORY_CARE_PROVIDER_SITE_OTHER): Admitting: Physician Assistant

## 2024-04-16 ENCOUNTER — Other Ambulatory Visit: Payer: Self-pay | Admitting: Physician Assistant

## 2024-04-16 ENCOUNTER — Encounter: Payer: Self-pay | Admitting: Physician Assistant

## 2024-04-16 ENCOUNTER — Ambulatory Visit (INDEPENDENT_AMBULATORY_CARE_PROVIDER_SITE_OTHER)

## 2024-04-16 VITALS — BP 141/74 | HR 63 | Ht 67.0 in | Wt 135.0 lb

## 2024-04-16 DIAGNOSIS — M1612 Unilateral primary osteoarthritis, left hip: Secondary | ICD-10-CM | POA: Diagnosis not present

## 2024-04-16 DIAGNOSIS — G8929 Other chronic pain: Secondary | ICD-10-CM | POA: Diagnosis not present

## 2024-04-16 DIAGNOSIS — M5442 Lumbago with sciatica, left side: Secondary | ICD-10-CM

## 2024-04-16 DIAGNOSIS — M51362 Other intervertebral disc degeneration, lumbar region with discogenic back pain and lower extremity pain: Secondary | ICD-10-CM

## 2024-04-16 DIAGNOSIS — M48061 Spinal stenosis, lumbar region without neurogenic claudication: Secondary | ICD-10-CM

## 2024-04-16 DIAGNOSIS — M47816 Spondylosis without myelopathy or radiculopathy, lumbar region: Secondary | ICD-10-CM | POA: Diagnosis not present

## 2024-04-16 DIAGNOSIS — F419 Anxiety disorder, unspecified: Secondary | ICD-10-CM | POA: Diagnosis not present

## 2024-04-16 DIAGNOSIS — M4316 Spondylolisthesis, lumbar region: Secondary | ICD-10-CM | POA: Diagnosis not present

## 2024-04-16 DIAGNOSIS — M5136 Other intervertebral disc degeneration, lumbar region with discogenic back pain only: Secondary | ICD-10-CM | POA: Diagnosis not present

## 2024-04-16 DIAGNOSIS — M4856XA Collapsed vertebra, not elsewhere classified, lumbar region, initial encounter for fracture: Secondary | ICD-10-CM | POA: Diagnosis not present

## 2024-04-16 DIAGNOSIS — K1120 Sialoadenitis, unspecified: Secondary | ICD-10-CM | POA: Diagnosis not present

## 2024-04-16 MED ORDER — TRAMADOL HCL 50 MG PO TABS: 50.0000 mg | ORAL_TABLET | Freq: Four times a day (QID) | ORAL | 0 refills | Status: AC | PRN

## 2024-04-16 MED ORDER — HYDROXYZINE HCL 10 MG PO TABS: 10.0000 mg | ORAL_TABLET | Freq: Three times a day (TID) | ORAL | 0 refills | Status: AC | PRN

## 2024-04-16 MED ORDER — METHYLPREDNISOLONE 4 MG PO TBPK: ORAL_TABLET | ORAL | 0 refills | Status: DC

## 2024-04-16 NOTE — Patient Instructions (Signed)
 Hydroxyzine  as needed for anxiety  Tramadol  for break through pain as needed Medrol  dose pack for exacerbation

## 2024-04-18 ENCOUNTER — Encounter: Payer: Self-pay | Admitting: Physician Assistant

## 2024-04-18 DIAGNOSIS — M51362 Other intervertebral disc degeneration, lumbar region with discogenic back pain and lower extremity pain: Secondary | ICD-10-CM | POA: Insufficient documentation

## 2024-04-18 MED ORDER — CEFDINIR 300 MG PO CAPS: 300.0000 mg | ORAL_CAPSULE | Freq: Two times a day (BID) | ORAL | 0 refills | Status: DC

## 2024-04-18 NOTE — Progress Notes (Signed)
 Acute Office Visit  Subjective:     Patient ID: Lynn Lloyd, female    DOB: 10-22-1955, 68 y.o.   MRN: [id number]  Chief Complaint  Patient presents with   Back Pain    HPI Pt is a 68 yo female with hx of chronic low back pain with DDD who presents to the clinic to discuss worsening low back pain.   Pt admits she was lifting heavy boxes and slept in a really uncomfortable bed that triggered the worsening low back pain on left side and down left leg. Advil does help. She denies any bowel or bladder dysfunction. She denies any leg weakness. She is struggling with the pain and not being able to keep up with her family doing things. If she is inactive the pain is bearable but if she tries to do things it is bad. MrI back in 2022 showed DDD and lumbar stenosis.    Pt is having intermittent inflammation at the opening where her salivary gland was opened and stone removed. She has hx of salivary infections and stones. She will get pain and taste bad stuff in her mouth and then have swelling but has been able to get it to go away right before tempted to make appt. She wonders what to do.   She is overall anxious about upcoming travel and with her ongoing health issues even more nervous. She would like something to take as needed for anxiety.   ROS See HPI.     Objective:    BP (!) 141/74   Pulse 63   Ht 5' 7 (1.702 m)   Wt 135 lb (61.2 kg)   SpO2 99%   BMI 21.14 kg/m  BP Readings from Last 3 Encounters:  04/16/24 (!) 141/74  12/18/23 135/78  12/06/23 95/74   Wt Readings from Last 3 Encounters:  04/16/24 135 lb (61.2 kg)  12/06/23 134 lb (60.8 kg)  11/05/23 134 lb (60.8 kg)      Physical Exam Constitutional:      Appearance: Normal appearance.  HENT:     Head: Normocephalic.  Cardiovascular:     Rate and Rhythm: Normal rate and regular rhythm.  Pulmonary:     Effort: Pulmonary effort is normal.     Breath sounds: Normal breath sounds.  Musculoskeletal:      Right lower leg: No edema.     Left lower leg: No edema.     Comments: Normal ROM at waist 5/5 strength of lower extremity No tenderness to palpation over bilateral greater trochanters Tenderness over lumbar spine and paraspinal muscles to the left Negative SLR, bilaterally.   Neurological:     Mental Status: She is alert.  Psychiatric:        Mood and Affect: Mood normal.          Assessment & Plan:  SABRASABRAKonnie was seen today for back pain.  Diagnoses and all orders for this visit:  Chronic left-sided low back pain with left-sided sciatica -     methylPREDNISolone  (MEDROL  DOSEPAK) 4 MG TBPK tablet; Take as directed by package insert. -     traMADol  (ULTRAM ) 50 MG tablet; Take 1 tablet (50 mg total) by mouth every 6 (six) hours as needed for up to 5 days. -     DG Lumbar Spine Complete; Future -     Cancel: DG HIP UNILAT W OR W/O PELVIS 2-3 VIEWS RIGHT; Future  Anxiety -     hydrOXYzine  (ATARAX ) 10 MG tablet; Take  1 tablet (10 mg total) by mouth 3 (three) times daily as needed for anxiety.  Salivary gland infection -     cefdinir  (OMNICEF ) 300 MG capsule; Take 1 capsule (300 mg total) by mouth 2 (two) times daily.   No red flags on back pain Repeat xrays Consider another MRI in the future Medrol  dose pack for exacerbation of pain Tramadol  for breakthrough pain Consider formal PT to help with pain Consider cymbalta for chronic pain Follow up after PT   Hydroxyzine  to try as needed for anxiety. Watch for sleepiness.   Discussed using water pick to try to keep food and debri from accumulating at the opening of salivary gland. Mouthwash regularly. Since upcoming travel given omnicef  to take on trip if having symptoms of infection and not improving with trying to remove.   Yazeed Pryer, PA-C

## 2024-04-22 ENCOUNTER — Ambulatory Visit: Payer: Self-pay | Admitting: Physician Assistant

## 2024-04-22 ENCOUNTER — Encounter: Payer: Self-pay | Admitting: Sports Medicine

## 2024-04-22 ENCOUNTER — Encounter: Payer: Self-pay | Admitting: Physician Assistant

## 2024-04-22 DIAGNOSIS — M51362 Other intervertebral disc degeneration, lumbar region with discogenic back pain and lower extremity pain: Secondary | ICD-10-CM

## 2024-04-22 DIAGNOSIS — G8929 Other chronic pain: Secondary | ICD-10-CM

## 2024-04-22 DIAGNOSIS — M48061 Spinal stenosis, lumbar region without neurogenic claudication: Secondary | ICD-10-CM

## 2024-04-22 DIAGNOSIS — M160 Bilateral primary osteoarthritis of hip: Secondary | ICD-10-CM

## 2024-04-22 NOTE — Progress Notes (Signed)
 No new findings on xray. Lots of degenerative disc disease. Slight slip of L4 on L5 of disc. Old T12 compression fracture.

## 2024-04-22 NOTE — Progress Notes (Signed)
 Bilateral hip arthritis.   Would you like to be referred to orthopedics or sports medicine provider?

## 2024-04-24 ENCOUNTER — Ambulatory Visit: Admitting: Rehabilitative and Restorative Service Providers"

## 2024-04-24 ENCOUNTER — Other Ambulatory Visit: Payer: Self-pay

## 2024-04-24 ENCOUNTER — Ambulatory Visit: Attending: Physician Assistant | Admitting: Rehabilitative and Restorative Service Providers"

## 2024-04-24 ENCOUNTER — Encounter: Payer: Self-pay | Admitting: Rehabilitative and Restorative Service Providers"

## 2024-04-24 DIAGNOSIS — M5459 Other low back pain: Secondary | ICD-10-CM | POA: Diagnosis present

## 2024-04-24 DIAGNOSIS — M79605 Pain in left leg: Secondary | ICD-10-CM | POA: Insufficient documentation

## 2024-04-24 DIAGNOSIS — R29898 Other symptoms and signs involving the musculoskeletal system: Secondary | ICD-10-CM | POA: Insufficient documentation

## 2024-04-24 DIAGNOSIS — M48061 Spinal stenosis, lumbar region without neurogenic claudication: Secondary | ICD-10-CM | POA: Diagnosis not present

## 2024-04-24 DIAGNOSIS — G8929 Other chronic pain: Secondary | ICD-10-CM | POA: Diagnosis not present

## 2024-04-24 DIAGNOSIS — M5442 Lumbago with sciatica, left side: Secondary | ICD-10-CM | POA: Diagnosis not present

## 2024-04-24 DIAGNOSIS — M6281 Muscle weakness (generalized): Secondary | ICD-10-CM | POA: Insufficient documentation

## 2024-04-24 DIAGNOSIS — M51362 Other intervertebral disc degeneration, lumbar region with discogenic back pain and lower extremity pain: Secondary | ICD-10-CM | POA: Diagnosis present

## 2024-04-24 NOTE — Therapy (Signed)
 OUTPATIENT PHYSICAL THERAPY THORACOLUMBAR EVALUATION   Patient Name: Lynn Lloyd MRN: [id number] DOB:[birth year], 68 y.o., female Today's Date: 04/24/2024  END OF SESSION:  PT End of Session - 04/24/24 1713     Visit Number 1    Number of Visits 16    Date for PT Re-Evaluation 06/19/24    Authorization Type [employer] - no auth required $50 copay    PT Start Time 1615    PT Stop Time 1700    PT Time Calculation (min) 45 min    Activity Tolerance Patient tolerated treatment well          Past Medical History:  Diagnosis Date   Chronic kidney disease    Constipation    Endometriosis    Hemorrhoid    Past Surgical History:  Procedure Laterality Date   ADENOIDECTOMY     BUNIONECTOMY  12/11   CHOLECYSTECTOMY     LAPAROSCOPY     endometriosis   SALIVARY GLAND SURGERY     removal   TONSILLECTOMY AND ADENOIDECTOMY     Patient Active Problem List   Diagnosis Date Noted   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 pain and lower extremity pain 04/18/2024   Anxiety 04/16/2024   Osteopenia 10/29/2023   Pelvic pressure in female 09/19/2023   Family history of ovarian cancer 09/19/2023   Frequent UTI 09/19/2023   Chronic left-sided low back pain with left-sided sciatica 09/11/2022   Vitamin D  insufficiency 09/11/2022   Neck pain 09/11/2022   Radicular pain in left arm 09/11/2022   Left lower quadrant abdominal pain 12/26/2021   Skin tag 09/09/2021   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   Elevated LDL cholesterol level 08/27/2020   Lumbar spinal stenosis 08/25/2020   No energy 01/20/2018   Chronic idiopathic constipation 01/18/2018   RLQ abdominal pain 01/18/2018   Bronchospasm 11/15/2017   Allergic rhinitis 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 11/15/2017   Environmental and seasonal allergies 11/15/2017   Encounter for adjustment or management of vascular access device 09/13/2016   Right shoulder pain 03/02/2016   Right knee pain 05/06/2015   Post  concussion syndrome 11/07/2013   Abdominal pain, other specified site 10/03/2010   FREQUENCY, URINARY 04/20/2010   Menopausal and postmenopausal disorder 12/15/2009   FINGER PAIN 10/22/2009   PAIN IN JOINT, MULTIPLE SITES 09/28/2009   Tinnitus 07/13/2009   Otalgia 07/13/2009   SOLAR KERATOSIS 03/31/2009   SEBORRHEIC KERATOSIS 08/12/2008   Benign neoplasm of skin 01/09/2008   POSTMENOPAUSAL STATUS 12/06/2007   Other malaise and fatigue 12/06/2007   Viral warts 09/09/2007   SPINAL STENOSIS, CERVICAL 09/09/2007   NEOPLASM, SKIN, UNCERTAIN BEHAVIOR 12/11/2006    PCP: Vermell LITTIE Bologna, PA-C  REFERRING PROVIDER: Vermell LITTIE Bologna, PA-C  REFERRING DIAG: Chronic L sided LBP with L sciatica; DDD lumbar spine with discogenic LBP; L LE pain; lumbar spinal stenosis without neurologic claudication   Rationale for Evaluation and Treatment: Rehabilitation  THERAPY DIAG:  Other low back pain  Pain in left leg  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 pain and lower extremity pain  Other symptoms and signs involving the musculoskeletal system  Muscle weakness (generalized)  ONSET DATE: 04/04/24  SUBJECTIVE:  SUBJECTIVE STATEMENT: Patient reports that she lifted a suitcase mid August and should not have. She slept in a too soft bed and that started having back pain. Prior episode ~ 2 years ago. Has had increased pain in the past few days with difficulty sleeping. Odd sensation L calf and some L back pain. Was able to kayaking yesterday and is able to walk ~ 3 miles a day.    PERTINENT HISTORY:  LBP for the past 5-10 years on an intermittent basis; salivary gland surgery past/current infection; bilat bunionectomy 12/11; hammer toe repair with nicked nerve; laparoscopic sx for  endometriosis; 4 children vaginal deliveries had twins; osteopenia; 3 HNP's cervical spine 2007 no surgery    PAIN:  Are you having pain? Yes: NPRS scale: 1-2/10; worst in past week 7/1; best in past week 0/10 Pain location: L LB into the L posterior hip into L lateral calf  Pain description: annoying  Aggravating factors: sleeping; lifting; sitting > 1 hour  Relieving factors: OTC meds; topical analgesic; ice or heat; changing positions   PRECAUTIONS: Other: osteopenia   RED FLAGS: None   WEIGHT BEARING RESTRICTIONS: No  FALLS:  Has patient fallen in last 6 months? 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  DIAGNOSTIC FINDINGS:  Bone density 3/25 - osteopenia Lumbar xray 04/16/24: IMPRESSION: 1. Multilevel degenerative disc disease. 2. Grade 1 anterolisthesis of L4 on L5, unchanged. 3. Old T12 compression fracture. Xray L hip 04/16/24; IMPRESSION: Mild degenerative changes in the left hip.    PATIENT SURVEYS:  PSFS: THE PATIENT SPECIFIC FUNCTIONAL SCALE  Place score of 0-10 (0 = unable to perform activity and 10 = able to perform activity at the same level as before injury or problem)  Activity Date: 04/24/24    Sleeping  4    2. Sitting > 1 hour  5    3. Lifting > 20 pounds  0    4.      Total Score 9      Total Score = Sum of activity scores/number of activities 9/3 =  3  Minimally Detectable Change: 3 points (for single activity); 2 points (for average score)  [facility] (nd). The Patient Specific Functional Scale . Retrieved from [email or web address]   COGNITION: Overall cognitive status: Within functional limits for tasks assessed     SENSATION: Odd sensations lateral L calf area   MUSCLE LENGTH: Hamstrings: Right 90 deg; Left 80 deg Thomas test: tight L > R   POSTURE: rounded shoulders and forward head  PALPATION: Significant tightness in the L > R iliacus; psoas; piriformis; gluts   LUMBAR ROM:   AROM eval  Flexion 100%  Extension 20% pain L SI  Right lateral flexion 90% pulling L SI  Left lateral flexion 90% discomfort  L   Right rotation 50% discmfort L   Left rotation 35% pain L    (Blank  rows = not tested)  LOWER EXTREMITY ROM:   WFL's tighter end range L than R through hips  Active  Right eval Left eval  Hip flexion    Hip extension 10 0  Hip abduction    Hip adduction    Hip internal rotation    Hip external rotation    Knee flexion    Knee extension    Ankle dorsiflexion    Ankle plantarflexion    Ankle inversion    Ankle eversion     (Blank rows = not tested)  LOWER EXTREMITY MMT:    MMT Right eval Left eval  Hip flexion 5 4  Hip extension 5 4+  Hip abduction 4 4  Hip adduction    Hip internal rotation    Hip external rotation    Knee flexion    Knee extension    Ankle dorsiflexion    Ankle plantarflexion    Ankle inversion    Ankle eversion     (Blank rows = not tested)  LUMBAR SPECIAL TESTS:  Straight leg raise test: Negative and Slump test: Negative  FUNCTIONAL TESTS:  5 times sit to stand: 10.25 sec use of UE's on surface  SLS: R/L 10+ sec   GAIT: Distance walked: 40 feet  Assistive device utilized: None Level of assistance: Complete Independence Comments: WNL's   TREATMENT DATE: POC; HEP                                                                                                                                   PATIENT EDUCATION:  Education details: POC; HEP  Person educated: Patient Education method: Programmer, multimedia, Facilities manager, Actor cues, Verbal cues, and Handouts Education comprehension: verbalized understanding, returned demonstration, verbal cues required, tactile cues required, and needs further education  HOME EXERCISE PROGRAM: Access Code: [facility] URL: [email or web address] Date: 04/24/2024 Prepared by: Josmar Messimer  Exercises - Hip Flexor Stretch at [facility] of Bed  - 2 x daily - 7 x weekly - 1 sets - 3 reps - 30 sec  hold - Supine Transversus Abdominis Bracing with Pelvic Floor Contraction  - 2 x daily - 7 x weekly - 1 sets - 10 reps - 10sec  hold - Supine Bridge  - 2 x daily - 7 x weekly - 1-2 sets - 10 reps - 5-10 sec  hold  Patient Education - Posture and Body Mechanics  ASSESSMENT:  CLINICAL IMPRESSION: Patient is a 68 y.o. female who was seen today for physical therapy evaluation and treatment for chronic recurrent L sided LBP with L sciatica. Patient has poor posture and alignment; limited lumbar extension, lateral flexion and rotation with pain in the L LB/sacral area; pain and tightness to palpation L iliacus, psoas, piriformis/gluts; weakness core and L LE; pain with functional activities; poor body mechanics and movement. Patient will benefit from PT to address problems identified.    OBJECTIVE IMPAIRMENTS: Abnormal gait, decreased activity tolerance, decreased  balance, decreased mobility, decreased ROM, decreased strength, increased fascial restrictions, impaired flexibility, improper body mechanics, postural dysfunction, and pain.   ACTIVITY LIMITATIONS: carrying, lifting, bending, sitting, standing, squatting, sleeping, stairs, transfers, and bed mobility  PARTICIPATION LIMITATIONS: meal prep, cleaning, laundry, driving, shopping, and community  activity  PERSONAL FACTORS: Past/current experiences and Time since onset of injury/illness/exacerbation are also affecting patient's functional outcome.   REHAB POTENTIAL: Good  CLINICAL DECISION MAKING: Evolving/moderate complexity  EVALUATION COMPLEXITY: Moderate   GOALS: Goals reviewed with patient? Yes  SHORT TERM GOALS: Target date: 05/22/2024   Independent in initial HEP  Baseline: Goal status: INITIAL  2.  Decrease pain with sleeping by 75% with patient reporting ability to sleep for 3-4 hours without awakening due to pain  Baseline:  Goal status: INITIAL  3.  Patient reports and demonstrates proper transfers and transitional movements as well as postures for sitting and reading to decrease irritation of LBP/L LE pain  Baseline:  Goal status: INITIAL   LONG TERM GOALS: Target date: 06/19/2024   Decrease LBP and L LE pain by 75-100% allowing patient to return to all normal functional activities  Baseline:  Goal status: INITIAL  2.  5/5 strength L LE  Baseline:  Goal status: INITIAL  3.  Patient reports ability to sit for > 1 hour with no pain  Baseline:  Goal status: INITIAL  4.  Patient reports ability to lift grandchildren weighing > 20-30 pounds  Baseline:  Goal status: INITIAL  5.  IMPROVE PSFS: THE PATIENT SPECIFIC FUNCTIONAL SCALE SCORE by 2 POINTS   Place score of 0-10 (0 = unable to perform activity and 10 = able to perform activity at the same level as before injury or problem)  Activity Date: 04/24/24    Sleeping  4    2. Sitting > 1 hour  5    3. Lifting > 20 pounds  0    4.      Total Score 9      Total Score = Sum of activity scores/number of activities 9/3 = 3  Minimally Detectable Change: 3 points (for single activity); 2 points (for average score) Baseline:  Goal status: INITIAL  6.  Independent in advance HEP including aquatic program as indicated  Baseline:  Goal status: INITIAL  PLAN:  PT FREQUENCY: 2x/week  PT  DURATION: 8 weeks  PLANNED INTERVENTIONS:  97164- PT Re-evaluation, 97110-Therapeutic exercises, 97530- Therapeutic activity, 97112- Neuromuscular re-education, 97535- Self Care, [id number]- Manual therapy, [phone]- Aquatic Therapy, Patient/Family education, Balance training, Stair training, Taping, and Joint mobilization.  PLAN FOR NEXT SESSION: review and progress exercises; continue with back care and ergonomic education and correction; manual work and modalities as indicated     Farra Nikolic P Keyairra Kolinski, PT 04/24/2024, 5:24 PM

## 2024-04-29 DIAGNOSIS — E785 Hyperlipidemia, unspecified: Secondary | ICD-10-CM | POA: Diagnosis not present

## 2024-04-29 DIAGNOSIS — Z008 Encounter for other general examination: Secondary | ICD-10-CM | POA: Diagnosis not present

## 2024-04-29 DIAGNOSIS — Z860100 Personal history of colon polyps, unspecified: Secondary | ICD-10-CM | POA: Diagnosis not present

## 2024-04-29 DIAGNOSIS — M545 Low back pain, unspecified: Secondary | ICD-10-CM | POA: Diagnosis not present

## 2024-05-22 ENCOUNTER — Ambulatory Visit: Admitting: Rehabilitative and Restorative Service Providers"

## 2024-05-27 ENCOUNTER — Ambulatory Visit: Admitting: Family Medicine

## 2024-06-04 ENCOUNTER — Ambulatory Visit (INDEPENDENT_AMBULATORY_CARE_PROVIDER_SITE_OTHER): Admitting: Family Medicine

## 2024-06-04 VITALS — BP 110/82 | Ht 67.25 in | Wt 135.0 lb

## 2024-06-04 DIAGNOSIS — M48062 Spinal stenosis, lumbar region with neurogenic claudication: Secondary | ICD-10-CM

## 2024-06-04 MED ORDER — CYCLOBENZAPRINE HCL 10 MG PO TABS: 10.0000 mg | ORAL_TABLET | Freq: Three times a day (TID) | ORAL | 0 refills | Status: AC | PRN

## 2024-06-04 MED ORDER — PREDNISONE 10 MG PO TABS: ORAL_TABLET | ORAL | 0 refills | Status: DC

## 2024-06-04 NOTE — Patient Instructions (Addendum)
 VISIT SUMMARY: During today's visit, we discussed your chronic back pain and the worsening numbness in your feet. We reviewed your history of spinal issues and the impact on your daily activities. We also addressed your concerns about maintaining your activity level and core strength.  YOUR PLAN: -LUMBAR SPINAL STENOSIS WITH NEUROGENIC CLAUDICATION AND CHRONIC LOW BACK PAIN: This condition involves the narrowing of the spinal canal in the lower back, which can cause pain and numbness. We will manage this conservatively to avoid surgical risks. You will start physical therapy focused on spinal stenosis management, avoiding extension-based activities. A prednisone  dose pack has been prescribed to reduce inflammation and nerve swelling. Please avoid taking Aleve or ibuprofen while on prednisone . After completing the prednisone , you can take Aleve twice daily as needed. Flexeril  has been provided for muscle relaxation, especially at bedtime if needed. Continue walking, yoga, and non-extension exercises, but avoid extension exercises like the Cobra pose. If additional anti-inflammatory medication is needed after prednisone , we may consider meloxicam .  INSTRUCTIONS: We will place referral to physical therapy in [facility].  Do home exercises on days you don't go to therapy.  Follow up with me in 6 weeks but call me sooner (or send mychart message) if you're struggling.

## 2024-06-04 NOTE — Progress Notes (Signed)
 PCP: Antoniette Vermell CROME, PA-C  Discussed the use of AI scribe software for clinical note transcription with the patient, who gave verbal consent to proceed.  History of Present Illness Lynn Lloyd is a 68 year old female with chronic back pain who presents with worsening numbness in her feet and concerns about spinal issues.  Lumbar spine pain - Chronic back pain since sustaining an injury two years ago while lifting a heavy chicken coop - History of slipped discs at L3-L4 and L4-L5 - History of compression fracture at T12 - Persistent pain despite physical therapy - Partial relief with a steroid dose pack - Yoga and arch supports provide intermittent relief but can also cause discomfort  Lower extremity paresthesia - Worsening numbness in the pads of both feet over the past few weeks - Numbness disrupts sleep and causes discomfort when wearing tight socks - Chronic numbness between the first and second toes on the left foot due to nerve injury during prior bunion surgery  Functional impact and activity concerns - Concern about maintaining activity level and core strength to keep up with grandchildren    Past Medical History:  Diagnosis Date   Chronic kidney disease    Constipation    Endometriosis    Hemorrhoid     Current Outpatient Medications on File Prior to Visit  Medication Sig Dispense Refill   Bacillus Coagulans-Inulin (PROBIOTIC-PREBIOTIC) 1-250 BILLION-MG CAPS Take 1 capsule by mouth daily.     Biotin 1 MG CAPS Take 1 capsule by mouth daily.     cefdinir  (OMNICEF ) 300 MG capsule Take 1 capsule (300 mg total) by mouth 2 (two) times daily. 20 capsule 0   cholecalciferol (VITAMIN D3) 25 MCG (1000 UNIT) tablet Take 1,000 Units by mouth daily.     conjugated estrogens (PREMARIN) vaginal cream Place 1 applicator vaginally once a week.     hydrOXYzine  (ATARAX ) 10 MG tablet Take 1 tablet (10 mg total) by mouth 3 (three) times daily as needed for anxiety. 30 tablet 0    Magnesium Glycinate 100 MG CAPS Take 1 capsule by mouth daily.     vitamin C (ASCORBIC ACID) 250 MG tablet Take 250 mg by mouth daily.     zinc gluconate 50 MG tablet Take 50 mg by mouth daily.     No current facility-administered medications on file prior to visit.    Past Surgical History:  Procedure Laterality Date   ADENOIDECTOMY     BUNIONECTOMY  12/11   CHOLECYSTECTOMY     LAPAROSCOPY     endometriosis   SALIVARY GLAND SURGERY     removal   TONSILLECTOMY AND ADENOIDECTOMY      Allergies  Allergen Reactions   Gabapentin  Other (See Comments)    Vision changes   Amoxicillin -Pot Clavulanate Other (See Comments)    get really spaced out pharmacist said was due to clavulanate   Codeine Nausea Only   Gluten Meal Other (See Comments)    Bloating   Zyrtec [Cetirizine]     Urinary retention/Frequent UTIs   Cauliflower [Brassica Oleracea] Other (See Comments)   Corn-Containing Products Other (See Comments)   Cow's Milk  [Milk (Cow)] Other (See Comments) and Cough   Latex Other (See Comments)    whelps   Soy Allergy (Obsolete) Other (See Comments)    Bloating    BP 110/82   Ht 5' 7.25 (1.708 m)   Wt 135 lb (61.2 kg)   BMI 20.99 kg/m       No  data to display              No data to display              Objective:  Physical Exam:  Gen: NAD, comfortable in exam room  Back: No gross deformity, scoliosis. No paraspinal TTP.  No midline or bony TTP. FROM without pain. Strength LEs 5/5 all muscle groups.   2+ MSRs in patellar and achilles tendons, equal bilaterally. Negative SLRs. Sensation diminished medial right foot and 1st dorsal web space. Negative logroll, faber, fadir bilateral hips. Negative piriformis stretches.  Assessment & Plan Lumbar spinal stenosis with neurogenic claudication and chronic low back pain Chronic low back pain with neurogenic claudication due to degenerative changes. No acute spinal cord compression or cauda equina  syndrome. Conservative management preferred due to surgical risks. - Refer to physical therapy for spinal stenosis management, avoiding extension-based activities. - Prescribe prednisone  dose pack to reduce inflammation and nerve swelling. - Advise against Aleve or ibuprofen while on prednisone . - Recommend Aleve twice daily as needed post-prednisone . - Provide Flexeril  for muscle relaxation, especially at bedtime if needed. - Encourage walking, yoga, and non-extension exercises. - Advise against extension exercises. - Consider meloxicam  if additional anti-inflammatory needed post-prednisone .  Status post left foot bunion surgery with residual numbness and new bilateral foot paresthesia Residual numbness between first and second toes on left foot post-surgery likely due to nerve injury. New bilateral foot paresthesia possibly related to lumbar spinal stenosis. No clear systemic causes identified; diabetes and vitamin B12 deficiency considered unlikely based on history and labs. - Monitor symptoms with lumbar spinal stenosis treatment. - Consider further evaluation if symptoms persist or worsen.

## 2024-06-10 ENCOUNTER — Ambulatory Visit: Attending: Family Medicine | Admitting: Rehabilitative and Restorative Service Providers"

## 2024-06-10 ENCOUNTER — Encounter: Payer: Self-pay | Admitting: Rehabilitative and Restorative Service Providers"

## 2024-06-10 ENCOUNTER — Other Ambulatory Visit: Payer: Self-pay

## 2024-06-10 DIAGNOSIS — M48062 Spinal stenosis, lumbar region with neurogenic claudication: Secondary | ICD-10-CM | POA: Insufficient documentation

## 2024-06-10 DIAGNOSIS — M5459 Other low back pain: Secondary | ICD-10-CM | POA: Diagnosis present

## 2024-06-10 DIAGNOSIS — M6281 Muscle weakness (generalized): Secondary | ICD-10-CM | POA: Diagnosis present

## 2024-06-10 DIAGNOSIS — M79605 Pain in left leg: Secondary | ICD-10-CM | POA: Diagnosis present

## 2024-06-10 DIAGNOSIS — R29898 Other symptoms and signs involving the musculoskeletal system: Secondary | ICD-10-CM | POA: Diagnosis present

## 2024-06-10 DIAGNOSIS — M51362 Other intervertebral disc degeneration, lumbar region with discogenic back pain and lower extremity pain: Secondary | ICD-10-CM | POA: Insufficient documentation

## 2024-06-10 NOTE — Therapy (Signed)
 OUTPATIENT PHYSICAL THERAPY THORACOLUMBAR EVALUATION   Patient Name: Lynn Lloyd MRN: [id number] DOB:1956/07/13, 68 y.o., female Today's Date: 06/10/2024  END OF SESSION:  PT End of Session - 06/10/24 1022     Visit Number 1    Number of Visits 16    Date for Recertification  08/09/24    Authorization Type [employer] - no auth required $50 copay    PT Start Time 1017    PT Stop Time 1100    PT Time Calculation (min) 43 min    Activity Tolerance Patient tolerated treatment well    Behavior During Therapy [facility] for tasks assessed/performed          Past Medical History:  Diagnosis Date   Chronic kidney disease    Constipation    Endometriosis    Hemorrhoid    Past Surgical History:  Procedure Laterality Date   ADENOIDECTOMY     BUNIONECTOMY  12/11   CHOLECYSTECTOMY     LAPAROSCOPY     endometriosis   SALIVARY GLAND SURGERY     removal   TONSILLECTOMY AND ADENOIDECTOMY     Patient Active Problem List   Diagnosis Date Noted   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 pain and lower extremity pain 04/18/2024   Anxiety 04/16/2024   Osteopenia 10/29/2023   Pelvic pressure in female 09/19/2023   Family history of ovarian cancer 09/19/2023   Frequent UTI 09/19/2023   Chronic left-sided low back pain with left-sided sciatica 09/11/2022   Vitamin D  insufficiency 09/11/2022   Neck pain 09/11/2022   Radicular pain in left arm 09/11/2022   Left lower quadrant abdominal pain 12/26/2021   Skin tag 09/09/2021   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   Elevated LDL cholesterol level 08/27/2020   Lumbar spinal stenosis 08/25/2020   No energy 01/20/2018   Chronic idiopathic constipation 01/18/2018   RLQ abdominal pain 01/18/2018   Bronchospasm 11/15/2017   Allergic rhinitis 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 11/15/2017   Environmental and seasonal allergies 11/15/2017   Encounter for adjustment or management of vascular access device 09/13/2016   Right  shoulder pain 03/02/2016   Right knee pain 05/06/2015   Post concussion syndrome 11/07/2013   Abdominal pain, other specified site 10/03/2010   FREQUENCY, URINARY 04/20/2010   Menopausal and postmenopausal disorder 12/15/2009   FINGER PAIN 10/22/2009   PAIN IN JOINT, MULTIPLE SITES 09/28/2009   Tinnitus 07/13/2009   Otalgia 07/13/2009   SOLAR KERATOSIS 03/31/2009   SEBORRHEIC KERATOSIS 08/12/2008   Benign neoplasm of skin 01/09/2008   POSTMENOPAUSAL STATUS 12/06/2007   Other malaise and fatigue 12/06/2007   Viral warts 09/09/2007   SPINAL STENOSIS, CERVICAL 09/09/2007   NEOPLASM, SKIN, UNCERTAIN BEHAVIOR 12/11/2006    PCP: Vermell Bologna, PA=C REFERRING PROVIDER: Ludie Littler, MD REFERRING DIAG:  Diagnosis  [phone] (ICD-10-CM) - Spinal stenosis of lumbar region with neurogenic claudication   Rationale for Evaluation and Treatment: Rehabilitation  THERAPY DIAG:  Other low back pain  Pain in left leg  Other symptoms and signs involving the musculoskeletal system  Muscle weakness (generalized)  ONSET DATE: 06/04/24  SUBJECTIVE:  SUBJECTIVE STATEMENT: The patient has h/o intermittent back pain. She notes pain worsens when standing for >1.5-2 hours. Pain can be severe where she has to lay supine with legs supported. Hip flexor stretching tends to aggravate pain. She notes in addition to back pain she also has L foot pain and some mild numbness in the R foot. She walks 3 miles/day, enjoys being active. She is doing cat/cow, bridges, bird dogs, plank, and pelvic tilts. She uses an inversion machine intermittent at home as well.   PERTINENT HISTORY:  Bunionectomy, h/o L3-L4 and L4-L5 slipped disc, h/o compression fx T12, LE paresthesias  PAIN:  Are you having pain? Yes: NPRS scale: 4/10 average  pain, it can be 100-- goes up to 10/10 when standing for 2 hours Pain location: L low back, L foot Pain description: muscle spasms in low back and wraps around the front Aggravating factors: standing for longer periods Relieving factors: lays flat with feet supported  PRECAUTIONS: oesteopenia  WEIGHT BEARING RESTRICTIONS: No  FALLS:  Has patient fallen in last 6 months? No  LIVING ENVIRONMENT: Lives with: lives with their spouse Lives in: House/apartment  OCCUPATION: retired  PLOF: Independent  PATIENT GOALS: more endurance for standing activities so she doesn't have to rest at 1.5 or 2 hours  OBJECTIVE:  Note: Objective measures were completed at Evaluation unless otherwise noted.  DIAGNOSTIC FINDINGS:  Xray 9/25 IMPRESSION: 1. Multilevel degenerative disc disease. 2. Grade 1 anterolisthesis of L4 on L5, unchanged. 3. Old T12 compression fracture.  PATIENT SURVEYS:  Modified Oswestry:  24%  COGNITION: Overall cognitive status: Within functional limits for tasks assessed     SENSATION: Chronic numbness L great toe web space, recent R foot numbness  MUSCLE LENGTH: Good HS length  POSTURE: No Significant postural limitations  PALPATION: Tender along lateral border of sacrum L side Tender L glut med Hypomobile throughout spine (not true spring test due to osteopenia) Hypomobility in SI region L  LUMBAR ROM:  AROM eval  Flexion WFLs  Extension WFLs, mild pain L glut med  Right lateral flexion WFLs, Increases L side pain  Left lateral flexion WFLs  Right rotation WFLs  Left rotation WFLs, Increased L side pain   (Blank rows = not tested)  LOWER EXTREMITY ROM:   WFLs  LOWER EXTREMITY MMT:   MMT Right eval Left eval  Hip flexion 4/5 4/5  Hip extension    Hip abduction    Hip adduction    Hip internal rotation 5/5 5/5  Hip external rotation 5/5 4/5  Knee flexion 4/5 4/5  Knee extension 5/5 5/5  Ankle dorsiflexion 4/5 4/5  Ankle plantarflexion     Ankle inversion    Ankle eversion     (Blank rows = not tested)  FUNCTIONAL TESTS:  Notes single leg stance/balance good-- can do tree for extended time  GAIT: Distance walked: 100 ft indep  [facility] Adult PT Treatment:                                                DATE: 06/10/24 Therapeutic Activity: Sidelying rocking with foam roller to mobilize SI joint Quadriped with hip mobilization using yoga block See HEP below  PATIENT EDUCATION:  Education details: HEP Person educated: Patient Education method: Programmer, multimedia, Facilities manager, and Handouts Education comprehension: verbalized understanding, returned demonstration, and needs further education  HOME EXERCISE PROGRAM:  Access Code: [email or web address] URL: [email or web address] Date: 06/10/2024 Prepared by: Tawni Ferrier  Exercises - Sidelying Open Book Thoracic Rotation with Knee on Foam Roll  - 1 x daily - 4-5 x weekly - 1 sets - 1 reps - 2-4 minutes hold - Quadruped Hip Hike on Foam  - 1 x daily - 4-5 x weekly - 1 sets - 10 reps  ASSESSMENT:  CLINICAL IMPRESSION: Patient is a 68 y.o. female who was seen today for physical therapy evaluation and treatment for chronic LBP radiating into L hip. In addition, she has some foot pain/numbness (PT to further evaluate). She presents today with hypomobility throughout spine, L SI hypomobility, pain in L glut med region, pain along L sacral border, LE weakness. PT to address deficits to reduce pain, and improve tolerance to functional tasks.  OBJECTIVE IMPAIRMENTS: decreased activity tolerance, decreased ROM, decreased strength, hypomobility, impaired flexibility, and pain.   ACTIVITY LIMITATIONS: carrying, lifting, standing, sleeping, and locomotion level  PARTICIPATION LIMITATIONS: cleaning, laundry, and community activity  PERSONAL FACTORS: 1-2 comorbidities: h/o chronic LBP, h/o bilat bunionectomies are also affecting patient's functional outcome.   REHAB POTENTIAL:  Good  CLINICAL DECISION MAKING: Evolving/moderate complexity  EVALUATION COMPLEXITY: Moderate  GOALS: Goals reviewed with patient? Yes  SHORT TERM GOALS: Target date: 07/10/24  The patient will be indep with initial HEP Baseline: initiated at eval Goal status: INITIAL  2.  The patient will report pain < or equal to 7/10 at worst. Baseline:  Can go up to 10/10 pain with prolonged standing Goal status: INITIAL  LONG TERM GOALS: Target date: 08/09/24  The patient will be indep with progression of HEP. Baseline:  initiated at eval Goal status: INITIAL  2.  The patient will improve modified oswestry by 10%  to demonstrate improved functional abilities. Baseline: 24% Goal status: INITIAL  3.  The patient will tolerate standing for 2-3 hours without reporting severe back pain (< than 3 point increase in pain with 2-3 hours). Baseline:  has to sit after 1.5-2 hours Goal status: INITIAL  4.  Improve hip and knee strength to 5/5 to demo improved functional strength for household activities. Baseline:  see above Goal status: INITIAL  5.  The patient will tolerate ROM lumbar spine without increased pain. Baseline:  see above Goal status: INITIAL  PLAN:  PT FREQUENCY: 2x/week  PT DURATION: 8 weeks  PLANNED INTERVENTIONS: 97164- PT Re-evaluation, 97750- Physical Performance Testing, 97110-Therapeutic exercises, 97530- Therapeutic activity, [id number]- Neuromuscular re-education, 97535- Self Care, 02859- Manual therapy, G0283- Electrical stimulation (unattended), 20560 (1-2 muscles), 20561 (3+ muscles)- Dry Needling, Patient/Family education, Taping, Joint mobilization, Spinal mobilization, Cryotherapy, and Moist heat.  PLAN FOR NEXT SESSION: mobilization of sacral region, glut and HS strengthening, core engagement/strength, standing tolerance, yoga for potential long term loading/strengthening  Tamarra Geiselman, PT 06/10/2024, 10:23 AM

## 2024-06-16 ENCOUNTER — Ambulatory Visit: Admitting: Rehabilitative and Restorative Service Providers"

## 2024-06-16 ENCOUNTER — Encounter: Payer: Self-pay | Admitting: Rehabilitative and Restorative Service Providers"

## 2024-06-16 DIAGNOSIS — R29898 Other symptoms and signs involving the musculoskeletal system: Secondary | ICD-10-CM

## 2024-06-16 DIAGNOSIS — M79605 Pain in left leg: Secondary | ICD-10-CM

## 2024-06-16 DIAGNOSIS — M5459 Other low back pain: Secondary | ICD-10-CM

## 2024-06-16 DIAGNOSIS — M51362 Other intervertebral disc degeneration, lumbar region with discogenic back pain and lower extremity pain: Secondary | ICD-10-CM

## 2024-06-16 DIAGNOSIS — M6281 Muscle weakness (generalized): Secondary | ICD-10-CM

## 2024-06-16 NOTE — Therapy (Signed)
 OUTPATIENT PHYSICAL THERAPY THORACOLUMBAR TREATMENT   Patient Name: Lynn Lloyd MRN: [id number] DOB:03/11/1956, 68 y.o., female Today's Date: 06/16/2024  END OF SESSION:  PT End of Session - 06/16/24 1406     Visit Number 2    Number of Visits 16    Date for Recertification  08/09/24    Authorization Type [employer] - no auth required $50 copay    PT Start Time 1405    PT Stop Time 1448    PT Time Calculation (min) 43 min    Activity Tolerance Patient tolerated treatment well    Behavior During Therapy [facility] for tasks assessed/performed          Past Medical History:  Diagnosis Date   Chronic kidney disease    Constipation    Endometriosis    Hemorrhoid    Past Surgical History:  Procedure Laterality Date   ADENOIDECTOMY     BUNIONECTOMY  12/11   CHOLECYSTECTOMY     LAPAROSCOPY     endometriosis   SALIVARY GLAND SURGERY     removal   TONSILLECTOMY AND ADENOIDECTOMY     Patient Active Problem List   Diagnosis Date Noted   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 pain and lower extremity pain 04/18/2024   Anxiety 04/16/2024   Osteopenia 10/29/2023   Pelvic pressure in female 09/19/2023   Family history of ovarian cancer 09/19/2023   Frequent UTI 09/19/2023   Chronic left-sided low back pain with left-sided sciatica 09/11/2022   Vitamin D  insufficiency 09/11/2022   Neck pain 09/11/2022   Radicular pain in left arm 09/11/2022   Left lower quadrant abdominal pain 12/26/2021   Skin tag 09/09/2021   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   Elevated LDL cholesterol level 08/27/2020   Lumbar spinal stenosis 08/25/2020   No energy 01/20/2018   Chronic idiopathic constipation 01/18/2018   RLQ abdominal pain 01/18/2018   Bronchospasm 11/15/2017   Allergic rhinitis 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 11/15/2017   Environmental and seasonal allergies 11/15/2017   Encounter for adjustment or management of vascular access device 09/13/2016   Right  shoulder pain 03/02/2016   Right knee pain 05/06/2015   Post concussion syndrome 11/07/2013   Abdominal pain, other specified site 10/03/2010   FREQUENCY, URINARY 04/20/2010   Menopausal and postmenopausal disorder 12/15/2009   FINGER PAIN 10/22/2009   PAIN IN JOINT, MULTIPLE SITES 09/28/2009   Tinnitus 07/13/2009   Otalgia 07/13/2009   SOLAR KERATOSIS 03/31/2009   SEBORRHEIC KERATOSIS 08/12/2008   Benign neoplasm of skin 01/09/2008   POSTMENOPAUSAL STATUS 12/06/2007   Other malaise and fatigue 12/06/2007   Viral warts 09/09/2007   SPINAL STENOSIS, CERVICAL 09/09/2007   NEOPLASM, SKIN, UNCERTAIN BEHAVIOR 12/11/2006    PCP: Vermell Bologna, PA=C REFERRING PROVIDER: Ludie Littler, MD REFERRING DIAG:  Diagnosis  [id number] (ICD-10-CM) - Spinal stenosis of lumbar region with neurogenic claudication   Rationale for Evaluation and Treatment: Rehabilitation  THERAPY DIAG:  Other low back pain  Pain in left leg  Other symptoms and signs involving the musculoskeletal system  Muscle weakness (generalized)  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 pain and lower extremity pain  ONSET DATE: 06/04/24  SUBJECTIVE:  SUBJECTIVE STATEMENT: The patient reports she has had multiple episodes of shooting pain in the mid back when sitting in the car. She noticed standing for longer periods does flare of back pain. She is getting L anterior hip pain that can radiate into the calf at times--not all the time, but occasionally.  EVAL: The patient has h/o intermittent back pain. She notes pain worsens when standing for >1.5-2 hours. Pain can be severe where she has to lay supine with legs supported. Hip flexor stretching tends to aggravate pain. She notes in addition to back pain she also  has L foot pain and some mild numbness in the R foot. She walks 3 miles/day, enjoys being active. She is doing cat/cow, bridges, bird dogs, plank, and pelvic tilts. She uses an inversion machine intermittent at home as well.   PERTINENT HISTORY:  Bunionectomy, h/o L3-L4 and L4-L5 slipped disc, h/o compression fx T12, LE paresthesias  PAIN:  Are you having pain? Yes: NPRS scale: 4/10 average pain, it can be 100-- goes up to 10/10 when standing for 2 hours Pain location: L low back, L foot Pain description: muscle spasms in low back and wraps around the front Aggravating factors: standing for longer periods Relieving factors: lays flat with feet supported  PRECAUTIONS: oesteopenia  WEIGHT BEARING RESTRICTIONS: No  FALLS:  Has patient fallen in last 6 months? No  PATIENT GOALS: more endurance for standing activities so she doesn't have to rest at 1.5 or 2 hours  OBJECTIVE:  Note: Objective measures were completed at Evaluation unless otherwise noted.  DIAGNOSTIC FINDINGS:  Xray 9/25 IMPRESSION: 1. Multilevel degenerative disc disease. 2. Grade 1 anterolisthesis of L4 on L5, unchanged. 3. Old T12 compression fracture.  PATIENT SURVEYS:  Modified Oswestry:  24%  COGNITION: Overall cognitive status: Within functional limits for tasks assessed     SENSATION: Chronic numbness L great toe web space, recent R foot numbness  MUSCLE LENGTH: Good HS length  POSTURE: No Significant postural limitations  PALPATION: Tender along lateral border of sacrum L side Tender L glut med Hypomobile throughout spine (not true spring test due to osteopenia) Hypomobility in SI region L  LUMBAR ROM:  AROM eval  Flexion WFLs  Extension WFLs, mild pain L glut med  Right lateral flexion WFLs, Increases L side pain  Left lateral flexion WFLs  Right rotation WFLs  Left rotation WFLs, Increased L side pain   (Blank rows = not tested)  LOWER EXTREMITY ROM:   WFLs  LOWER EXTREMITY MMT:    MMT Right eval Left eval  Hip flexion 4/5 4/5  Hip extension    Hip abduction    Hip adduction    Hip internal rotation 5/5 5/5  Hip external rotation 5/5 4/5  Knee flexion 4/5 4/5  Knee extension 5/5 5/5  Ankle dorsiflexion 4/5 4/5  Ankle plantarflexion    Ankle inversion    Ankle eversion     (Blank rows = not tested)  FUNCTIONAL TESTS:  Notes single leg stance/balance good-- can do tree for extended time  GAIT: Distance walked: 100 ft indep  [facility] Adult PT Treatment:                                                DATE: 06/16/24 Therapeutic Exercise: Debby test stretch R and L Plank on knees (8 point) Plank on elbows  x 2 reps Extended plank x 1 reps Prone hip extension x R and L sides Manual Therapy: Self soft tissue release with massage ball against wall Therapeutic Activity: Sidelying rocking with foam roller to mobilize SI joint Quadriped hip mobility  Supine bridge with yoga block with gentle rocking T stand at countertop 1/2 squats x 10 reps at countertop   [facility] Adult PT Treatment:                                                DATE: 06/10/24 Therapeutic Activity: Sidelying rocking with foam roller to mobilize SI joint Quadriped with hip mobilization using yoga block See HEP below  PATIENT EDUCATION:  Education details: HEP Person educated: Patient Education method: Programmer, Multimedia, Facilities Manager, and Handouts Education comprehension: verbalized understanding, returned demonstration, and needs further education  HOME EXERCISE PROGRAM: Access Code: [email or web address] URL: [email or web address] Date: 06/16/2024 Prepared by: Tawni Ferrier  Exercises - Sidelying Open Book Thoracic Rotation with Knee on Foam Roll  - 1 x daily - 4-5 x weekly - 1 sets - 1 reps - 2-4 minutes hold - Quadruped Hip Hike on Foam  - 1 x daily - 4-5 x weekly - 1 sets - 10 reps - Prone SIJ Anterior Rotation Mobilization on Table  - 1 x daily - 5 x weekly - 1 sets - 2 reps -  30 seconds hold - Side Stepping with Resistance at Thighs  - 1 x daily - 5 x weekly - 2 sets - 10 reps - Forward T with Counter Support  - 1 x daily - 5 x weekly - 1 sets - 10 reps  ASSESSMENT:  CLINICAL IMPRESSION: The patient improves in therapy session with left hip mobility and reduced anterior hip pain. She is continuing to tolerate mobility activities well and we added strengthening/stabilization activities to begin at home. Patient tolerates exercises well.   Patient is a 68 y.o. female who was seen today for physical therapy evaluation and treatment for chronic LBP radiating into L hip. In addition, she has some foot pain/numbness (PT to further evaluate). She presents today with hypomobility throughout spine, L SI hypomobility, pain in L glut med region, pain along L sacral border, LE weakness. PT to address deficits to reduce pain, and improve tolerance to functional tasks.  GOALS: Goals reviewed with patient? Yes  SHORT TERM GOALS: Target date: 07/10/24  The patient will be indep with initial HEP Baseline: initiated at eval Goal status: INITIAL  2.  The patient will report pain < or equal to 7/10 at worst. Baseline:  Can go up to 10/10 pain with prolonged standing Goal status: INITIAL  LONG TERM GOALS: Target date: 08/09/24  The patient will be indep with progression of HEP. Baseline:  initiated at eval Goal status: INITIAL  2.  The patient will improve modified oswestry by 10%  to demonstrate improved functional abilities. Baseline: 24% Goal status: INITIAL  3.  The patient will tolerate standing for 2-3 hours without reporting severe back pain (< than 3 point increase in pain with 2-3 hours). Baseline:  has to sit after 1.5-2 hours Goal status: INITIAL  4.  Improve hip and knee strength to 5/5 to demo improved functional strength for household activities. Baseline:  see above Goal status: INITIAL  5.  The patient will tolerate ROM lumbar spine without increased  pain. Baseline:  see  above Goal status: INITIAL  PLAN:  PT FREQUENCY: 2x/week  PT DURATION: 8 weeks  PLANNED INTERVENTIONS: 97164- PT Re-evaluation, 97750- Physical Performance Testing, 97110-Therapeutic exercises, 97530- Therapeutic activity, [id number]- Neuromuscular re-education, 97535- Self Care, 02859- Manual therapy, G0283- Electrical stimulation (unattended), 20560 (1-2 muscles), 20561 (3+ muscles)- Dry Needling, Patient/Family education, Taping, Joint mobilization, Spinal mobilization, Cryotherapy, and Moist heat.  PLAN FOR NEXT SESSION: mobilization of sacral region, glut and HS strengthening, core engagement/strength, standing tolerance, yoga for potential long term loading/strengthening  Andren Bethea, PT 06/16/2024, 3:22 PM

## 2024-06-19 ENCOUNTER — Ambulatory Visit: Admitting: Rehabilitative and Restorative Service Providers"

## 2024-06-19 DIAGNOSIS — M5459 Other low back pain: Secondary | ICD-10-CM

## 2024-06-19 DIAGNOSIS — R29898 Other symptoms and signs involving the musculoskeletal system: Secondary | ICD-10-CM

## 2024-06-19 DIAGNOSIS — M79605 Pain in left leg: Secondary | ICD-10-CM

## 2024-06-19 DIAGNOSIS — M6281 Muscle weakness (generalized): Secondary | ICD-10-CM

## 2024-06-19 NOTE — Therapy (Signed)
 OUTPATIENT PHYSICAL THERAPY THORACOLUMBAR TREATMENT   Patient Name: Lynn Lloyd MRN: [id number] DOB:1956-04-28, 68 y.o., female Today's Date: 06/19/2024  END OF SESSION:  PT End of Session - 06/19/24 1020     Visit Number 3    Number of Visits 16    Date for Recertification  08/09/24    Authorization Type [employer] - no auth required $50 copay    PT Start Time 1020    PT Stop Time 1100    PT Time Calculation (min) 40 min    Activity Tolerance Patient tolerated treatment well    Behavior During Therapy [facility] for tasks assessed/performed           Past Medical History:  Diagnosis Date   Chronic kidney disease    Constipation    Endometriosis    Hemorrhoid    Past Surgical History:  Procedure Laterality Date   ADENOIDECTOMY     BUNIONECTOMY  12/11   CHOLECYSTECTOMY     LAPAROSCOPY     endometriosis   SALIVARY GLAND SURGERY     removal   TONSILLECTOMY AND ADENOIDECTOMY     Patient Active Problem List   Diagnosis Date Noted   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 pain and lower extremity pain 04/18/2024   Anxiety 04/16/2024   Osteopenia 10/29/2023   Pelvic pressure in female 09/19/2023   Family history of ovarian cancer 09/19/2023   Frequent UTI 09/19/2023   Chronic left-sided low back pain with left-sided sciatica 09/11/2022   Vitamin D  insufficiency 09/11/2022   Neck pain 09/11/2022   Radicular pain in left arm 09/11/2022   Left lower quadrant abdominal pain 12/26/2021   Skin tag 09/09/2021   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   Elevated LDL cholesterol level 08/27/2020   Lumbar spinal stenosis 08/25/2020   No energy 01/20/2018   Chronic idiopathic constipation 01/18/2018   RLQ abdominal pain 01/18/2018   Bronchospasm 11/15/2017   Allergic rhinitis 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 11/15/2017   Environmental and seasonal allergies 11/15/2017   Encounter for adjustment or management of vascular access device 09/13/2016   Right  shoulder pain 03/02/2016   Right knee pain 05/06/2015   Post concussion syndrome 11/07/2013   Abdominal pain, other specified site 10/03/2010   FREQUENCY, URINARY 04/20/2010   Menopausal and postmenopausal disorder 12/15/2009   FINGER PAIN 10/22/2009   PAIN IN JOINT, MULTIPLE SITES 09/28/2009   Tinnitus 07/13/2009   Otalgia 07/13/2009   SOLAR KERATOSIS 03/31/2009   SEBORRHEIC KERATOSIS 08/12/2008   Benign neoplasm of skin 01/09/2008   POSTMENOPAUSAL STATUS 12/06/2007   Other malaise and fatigue 12/06/2007   Viral warts 09/09/2007   SPINAL STENOSIS, CERVICAL 09/09/2007   NEOPLASM, SKIN, UNCERTAIN BEHAVIOR 12/11/2006    PCP: Vermell Bologna, PA=C REFERRING PROVIDER: Ludie Littler, MD REFERRING DIAG:  Diagnosis  [phone] (ICD-10-CM) - Spinal stenosis of lumbar region with neurogenic claudication   Rationale for Evaluation and Treatment: Rehabilitation  THERAPY DIAG:  Other low back pain  Pain in left leg  Other symptoms and signs involving the musculoskeletal system  Muscle weakness (generalized)  ONSET DATE: 06/04/24  SUBJECTIVE:  SUBJECTIVE STATEMENT: The patient reports that she is feeling some muscle soreness in hamstrings and hips from sidestepping exercises. The shooting pain in back has not occurred this week.  EVAL: The patient has h/o intermittent back pain. She notes pain worsens when standing for >1.5-2 hours. Pain can be severe where she has to lay supine with legs supported. Hip flexor stretching tends to aggravate pain. She notes in addition to back pain she also has L foot pain and some mild numbness in the R foot. She walks 3 miles/day, enjoys being active. She is doing cat/cow, bridges, bird dogs, plank, and pelvic tilts. She uses an inversion machine intermittent at home as well.    PERTINENT HISTORY:  Bunionectomy, h/o L3-L4 and L4-L5 slipped disc, h/o compression fx T12, LE paresthesias  PAIN:  Are you having pain? Yes: NPRS scale: 1/10 currently Pain location: L low back, L foot Pain description: muscle spasms in low back and wraps around the front Aggravating factors: standing for longer periods Relieving factors: lays flat with feet supported  PRECAUTIONS: oesteopenia  WEIGHT BEARING RESTRICTIONS: No  FALLS:  Has patient fallen in last 6 months? No  PATIENT GOALS: more endurance for standing activities so she doesn't have to rest at 1.5 or 2 hours  OBJECTIVE:  Note: Objective measures were completed at Evaluation unless otherwise noted.  DIAGNOSTIC FINDINGS:  Xray 9/25 IMPRESSION: 1. Multilevel degenerative disc disease. 2. Grade 1 anterolisthesis of L4 on L5, unchanged. 3. Old T12 compression fracture.  PATIENT SURVEYS:  Modified Oswestry:  24%  COGNITION: Overall cognitive status: Within functional limits for tasks assessed     SENSATION: Chronic numbness L great toe web space, recent R foot numbness  MUSCLE LENGTH: Good HS length  POSTURE: No Significant postural limitations  PALPATION: Tender along lateral border of sacrum L side Tender L glut med Hypomobile throughout spine (not true spring test due to osteopenia) Hypomobility in SI region L  LUMBAR ROM:  AROM eval  Flexion WFLs  Extension WFLs, mild pain L glut med  Right lateral flexion WFLs, Increases L side pain  Left lateral flexion WFLs  Right rotation WFLs  Left rotation WFLs, Increased L side pain   (Blank rows = not tested)  LOWER EXTREMITY ROM:   WFLs  LOWER EXTREMITY MMT:   MMT Right eval Left eval  Hip flexion 4/5 4/5  Hip extension    Hip abduction    Hip adduction    Hip internal rotation 5/5 5/5  Hip external rotation 5/5 4/5  Knee flexion 4/5 4/5  Knee extension 5/5 5/5  Ankle dorsiflexion 4/5 4/5  Ankle plantarflexion    Ankle inversion     Ankle eversion     (Blank rows = not tested)  FUNCTIONAL TESTS:  Notes single leg stance/balance good-- can do tree for extended time  GAIT: Distance walked: 100 ft indep  [facility] Adult PT Treatment:                                                DATE: 06/19/24 Neuromuscular re-ed: Quadriped cat/cow x 10 reps Single leg passing 5# front to back R and L sides  Hip openers-- R and L -- L side feels like it gets stuck so discontinued Therapeutic Activity: Standing high lunges with counter rotation x 10 reps each direction Standing marching  with 5# kettle bell  T reach with 5# kettle bell x 10 reps R and L sides  Single leg bridges x 10 reps R and L  Physioball roll in knee to chest for HS engagement Supine yoga    [facility] Adult PT Treatment:                                                DATE: 06/16/24 Therapeutic Exercise: Debby test stretch R and L Plank on knees (8 point) Plank on elbows x 2 reps Extended plank x 1 reps Prone hip extension x R and L sides Manual Therapy: Self soft tissue release with massage ball against wall Therapeutic Activity: Sidelying rocking with foam roller to mobilize SI joint Quadriped hip mobility  Supine bridge with yoga block with gentle rocking T stand at countertop 1/2 squats x 10 reps at [employer] Adult PT Treatment:                                                DATE: 06/10/24 Therapeutic Activity: Sidelying rocking with foam roller to mobilize SI joint Quadriped with hip mobilization using yoga block See HEP below  PATIENT EDUCATION:  Education details: HEP Person educated: Patient Education method: Programmer, Multimedia, Facilities Manager, and Handouts Education comprehension: verbalized understanding, returned demonstration, and needs further education  HOME EXERCISE PROGRAM: Access Code: [email or web address] URL: [email or web address] Date: 06/16/2024 Prepared by: Tawni Ferrier  Exercises - Sidelying Open Book Thoracic  Rotation with Knee on Foam Roll  - 1 x daily - 4-5 x weekly - 1 sets - 1 reps - 2-4 minutes hold - Quadruped Hip Hike on Foam  - 1 x daily - 4-5 x weekly - 1 sets - 10 reps - Prone SIJ Anterior Rotation Mobilization on Table  - 1 x daily - 5 x weekly - 1 sets - 2 reps - 30 seconds hold - Side Stepping with Resistance at Thighs  - 1 x daily - 5 x weekly - 2 sets - 10 reps - Forward T with Counter Support  - 1 x daily - 5 x weekly - 1 sets - 10 reps  ASSESSMENT:  CLINICAL IMPRESSION: The patient is continuing to tolerate a progression of strengthening activities. We also began discussing options for post therapy strengthening including [employer] silver fit classes, yoga, and other strength training options. The patient continues to feel some tightness with L LE loading and hip openers. PT to continue to progress to patient tolerance.  Patient is a 68 y.o. female who was seen today for physical therapy evaluation and treatment for chronic LBP radiating into L hip. In addition, she has some foot pain/numbness (PT to further evaluate). She presents today with hypomobility throughout spine, L SI hypomobility, pain in L glut med region, pain along L sacral border, LE weakness. PT to address deficits to reduce pain, and improve tolerance to functional tasks.  GOALS: Goals reviewed with patient? Yes  SHORT TERM GOALS: Target date: 07/10/24  The patient will be indep with initial HEP Baseline: initiated at eval Goal status: INITIAL  2.  The patient will report pain < or equal to 7/10 at worst. Baseline:  Can go up to 10/10 pain with prolonged standing Goal status: INITIAL  LONG TERM  GOALS: Target date: 08/09/24  The patient will be indep with progression of HEP. Baseline:  initiated at eval Goal status: INITIAL  2.  The patient will improve modified oswestry by 10%  to demonstrate improved functional abilities. Baseline: 24% Goal status: INITIAL  3.  The patient will tolerate standing for 2-3  hours without reporting severe back pain (< than 3 point increase in pain with 2-3 hours). Baseline:  has to sit after 1.5-2 hours Goal status: INITIAL  4.  Improve hip and knee strength to 5/5 to demo improved functional strength for household activities. Baseline:  see above Goal status: INITIAL  5.  The patient will tolerate ROM lumbar spine without increased pain. Baseline:  see above Goal status: INITIAL  PLAN:  PT FREQUENCY: 2x/week  PT DURATION: 8 weeks  PLANNED INTERVENTIONS: 97164- PT Re-evaluation, 97750- Physical Performance Testing, 97110-Therapeutic exercises, 97530- Therapeutic activity, [id number]- Neuromuscular re-education, 97535- Self Care, 02859- Manual therapy, [id number]- Electrical stimulation (unattended), 20560 (1-2 muscles), 20561 (3+ muscles)- Dry Needling, Patient/Family education, Taping, Joint mobilization, Spinal mobilization, Cryotherapy, and Moist heat.  PLAN FOR NEXT SESSION: mobilization of sacral region, glut and HS strengthening, core engagement/strength, standing tolerance, yoga for potential long term loading/strengthening  Kimbrely Buckel, PT 06/19/2024, 12:40 PM

## 2024-06-23 ENCOUNTER — Encounter: Payer: Self-pay | Admitting: Rehabilitative and Restorative Service Providers"

## 2024-06-23 ENCOUNTER — Ambulatory Visit: Attending: Family Medicine | Admitting: Rehabilitative and Restorative Service Providers"

## 2024-06-23 DIAGNOSIS — M5459 Other low back pain: Secondary | ICD-10-CM | POA: Insufficient documentation

## 2024-06-23 DIAGNOSIS — M51362 Other intervertebral disc degeneration, lumbar region with discogenic back pain and lower extremity pain: Secondary | ICD-10-CM | POA: Diagnosis present

## 2024-06-23 DIAGNOSIS — M6281 Muscle weakness (generalized): Secondary | ICD-10-CM | POA: Diagnosis present

## 2024-06-23 DIAGNOSIS — M79605 Pain in left leg: Secondary | ICD-10-CM | POA: Diagnosis present

## 2024-06-23 DIAGNOSIS — R29898 Other symptoms and signs involving the musculoskeletal system: Secondary | ICD-10-CM | POA: Diagnosis present

## 2024-06-23 NOTE — Therapy (Signed)
 OUTPATIENT PHYSICAL THERAPY THORACOLUMBAR TREATMENT   Patient Name: RASHAUNA TEP MRN: [id number] DOB:Jul 27, 1956, 68 y.o., female Today's Date: 06/23/2024  END OF SESSION:  PT End of Session - 06/23/24 1108     Visit Number 4    Number of Visits 16    Date for Recertification  08/09/24    Authorization Type [employer] - no auth required $50 copay    PT Start Time 1105    PT Stop Time 1145    PT Time Calculation (min) 40 min    Activity Tolerance Patient tolerated treatment well    Behavior During Therapy [facility] for tasks assessed/performed          Past Medical History:  Diagnosis Date   Chronic kidney disease    Constipation    Endometriosis    Hemorrhoid    Past Surgical History:  Procedure Laterality Date   ADENOIDECTOMY     BUNIONECTOMY  12/11   CHOLECYSTECTOMY     LAPAROSCOPY     endometriosis   SALIVARY GLAND SURGERY     removal   TONSILLECTOMY AND ADENOIDECTOMY     Patient Active Problem List   Diagnosis Date Noted   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 pain and lower extremity pain 04/18/2024   Anxiety 04/16/2024   Osteopenia 10/29/2023   Pelvic pressure in female 09/19/2023   Family history of ovarian cancer 09/19/2023   Frequent UTI 09/19/2023   Chronic left-sided low back pain with left-sided sciatica 09/11/2022   Vitamin D  insufficiency 09/11/2022   Neck pain 09/11/2022   Radicular pain in left arm 09/11/2022   Left lower quadrant abdominal pain 12/26/2021   Skin tag 09/09/2021   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   Elevated LDL cholesterol level 08/27/2020   Lumbar spinal stenosis 08/25/2020   No energy 01/20/2018   Chronic idiopathic constipation 01/18/2018   RLQ abdominal pain 01/18/2018   Bronchospasm 11/15/2017   Allergic rhinitis 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 11/15/2017   Environmental and seasonal allergies 11/15/2017   Encounter for adjustment or management of vascular access device 09/13/2016   Right  shoulder pain 03/02/2016   Right knee pain 05/06/2015   Post concussion syndrome 11/07/2013   Abdominal pain, other specified site 10/03/2010   FREQUENCY, URINARY 04/20/2010   Menopausal and postmenopausal disorder 12/15/2009   FINGER PAIN 10/22/2009   PAIN IN JOINT, MULTIPLE SITES 09/28/2009   Tinnitus 07/13/2009   Otalgia 07/13/2009   SOLAR KERATOSIS 03/31/2009   SEBORRHEIC KERATOSIS 08/12/2008   Benign neoplasm of skin 01/09/2008   POSTMENOPAUSAL STATUS 12/06/2007   Other malaise and fatigue 12/06/2007   Viral warts 09/09/2007   SPINAL STENOSIS, CERVICAL 09/09/2007   NEOPLASM, SKIN, UNCERTAIN BEHAVIOR 12/11/2006    PCP: Vermell Bologna, PA=C REFERRING PROVIDER: Ludie Littler, MD REFERRING DIAG:  Diagnosis  [phone] (ICD-10-CM) - Spinal stenosis of lumbar region with neurogenic claudication   Rationale for Evaluation and Treatment: Rehabilitation  THERAPY DIAG:  Other low back pain  Pain in left leg  Other symptoms and signs involving the musculoskeletal system  Muscle weakness (generalized)  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 pain and lower extremity pain  ONSET DATE: 06/04/24  SUBJECTIVE:  SUBJECTIVE STATEMENT: The patient went to [employer] on Saturday and felt pain after 1.5 hours. She feels she is getting stronger with walking, but it is not translating into being able to stand for longer periods.   EVAL: The patient has h/o intermittent back pain. She notes pain worsens when standing for >1.5-2 hours. Pain can be severe where she has to lay supine with legs supported. Hip flexor stretching tends to aggravate pain. She notes in addition to back pain she also has L foot pain and some mild numbness in the R foot. She walks 3 miles/day, enjoys being  active. She is doing cat/cow, bridges, bird dogs, plank, and pelvic tilts. She uses an inversion machine intermittent at home as well.   PERTINENT HISTORY:  Bunionectomy, h/o L3-L4 and L4-L5 slipped disc, h/o compression fx T12, LE paresthesias  PAIN:  Are you having pain? Yes: NPRS scale: 1/10 currently Pain location: L low back, L foot Pain description: muscle spasms in low back and wraps around the front Aggravating factors: standing for longer periods Relieving factors: lays flat with feet supported  PRECAUTIONS: oesteopenia  WEIGHT BEARING RESTRICTIONS: No  FALLS:  Has patient fallen in last 6 months? No  PATIENT GOALS: more endurance for standing activities so she doesn't have to rest at 1.5 or 2 hours  OBJECTIVE:  Note: Objective measures were completed at Evaluation unless otherwise noted.  DIAGNOSTIC FINDINGS:  Xray 9/25 IMPRESSION: 1. Multilevel degenerative disc disease. 2. Grade 1 anterolisthesis of L4 on L5, unchanged. 3. Old T12 compression fracture.  PATIENT SURVEYS:  Modified Oswestry:  24%  COGNITION: Overall cognitive status: Within functional limits for tasks assessed     SENSATION: Chronic numbness L great toe web space, recent R foot numbness  MUSCLE LENGTH: Good HS length  POSTURE: No Significant postural limitations  PALPATION: Tender along lateral border of sacrum L side Tender L glut med Hypomobile throughout spine (not true spring test due to osteopenia) Hypomobility in SI region L  LUMBAR ROM:  AROM eval  Flexion WFLs  Extension WFLs, mild pain L glut med  Right lateral flexion WFLs, Increases L side pain  Left lateral flexion WFLs  Right rotation WFLs  Left rotation WFLs, Increased L side pain   (Blank rows = not tested)  LOWER EXTREMITY ROM:   WFLs  LOWER EXTREMITY MMT:   MMT Right eval Left eval  Hip flexion 4/5 4/5  Hip extension    Hip abduction    Hip adduction    Hip internal rotation 5/5 5/5  Hip external  rotation 5/5 4/5  Knee flexion 4/5 4/5  Knee extension 5/5 5/5  Ankle dorsiflexion 4/5 4/5  Ankle plantarflexion    Ankle inversion    Ankle eversion     (Blank rows = not tested)  FUNCTIONAL TESTS:  Notes single leg stance/balance good-- can do tree for extended time  GAIT: Distance walked: 100 ft indep  [facility] Adult PT Treatment:                                                DATE: 06/23/24 Therapeutic Exercise: Discussed plan for HEP to have 5 strengthening exercises + stretching and foam rolling Reviewed patient's silver sneakers app and discussed potential exercise/strength classes to add to her routine Manual Therapy: Foam roller glut med and glut max with legs crossed to release  one side at a time Therapeutic Activity: Quadriped primal push up x 5 reps with 10 second holds Quadriped bird dog x 5 reps R and L sides Extended plank to side plank x 3 reps R and L sides Side plank on elbows-- moved to extended for more challenge x 2 reps Sit<>stand squat to chair touch Plank on elbows x 2 reps   [facility] Adult PT Treatment:                                                DATE: 06/19/24 Neuromuscular re-ed: Quadriped cat/cow x 10 reps Single leg passing 5# front to back R and L sides  Hip openers-- R and L -- L side feels like it gets stuck so discontinued Therapeutic Activity: Standing high lunges with counter rotation x 10 reps each direction Standing marching  with 5# kettle bell T reach with 5# kettle bell x 10 reps R and L sides  Single leg bridges x 10 reps R and L  Physioball roll in knee to chest for HS engagement Supine yoga    [facility] Adult PT Treatment:                                                DATE: 06/16/24 Therapeutic Exercise: Debby test stretch R and L Plank on knees (8 point) Plank on elbows x 2 reps Extended plank x 1 reps Prone hip extension x R and L sides Manual Therapy: Self soft tissue release with massage ball against wall Therapeutic  Activity: Sidelying rocking with foam roller to mobilize SI joint Quadriped hip mobility  Supine bridge with yoga block with gentle rocking T stand at countertop 1/2 squats x 10 reps at [email or web address]   [facility] Adult PT Treatment:                                                DATE: 06/10/24 Therapeutic Activity: Sidelying rocking with foam roller to mobilize SI joint Quadriped with hip mobilization using yoga block See HEP below  PATIENT EDUCATION:  Education details: HEP Person educated: Patient Education method: Programmer, Multimedia, Facilities Manager, and Handouts Education comprehension: verbalized understanding, returned demonstration, and needs further education  HOME EXERCISE PROGRAM: Access Code: [email or web address] URL: [email or web address] Date: 06/23/2024 Prepared by: Tawni Ferrier  Exercises - Prone SIJ Anterior Rotation Mobilization on Table  - 1 x daily - 4 x weekly - 1 sets - 2 reps - 30 seconds hold - Sidelying Open Book Thoracic Rotation with Knee on Foam Roll  - 1 x daily - 4 x weekly - 1 sets - 1 reps - 2-4 minutes hold - Gluteus Mobilization with Foam Roll  - 1 x daily - 4 x weekly - 1 sets - 1 reps - 60 minutes hold - Figure 4 Gluteus Mobilization on Foam Roll  - 1 x daily - 4 x weekly - 1 sets - 1 reps - Standard Plank  - 2 x daily - 3 x weekly - 1 sets - 10 reps - Side Plank with Full Arm Support  - 2 x daily - 3 x weekly -  1 sets - 10 reps - Bird Dog  - 2 x daily - 3 x weekly - 1 sets - 10 reps - Side Stepping with Resistance at Thighs  - 1 x daily - 3 x weekly - 2 sets - 10 reps - Forward T with Counter Support  - 1 x daily - 3 x weekly - 1 sets - 10 reps - Squat with Chair Touch  - 2 x daily - 3 x weekly - 1 sets - 10 reps  ASSESSMENT:  CLINICAL IMPRESSION: The patient and PT discussed modifying HEP to ensure including enough strengthening at home. We added foam rolling for glut release and recommend continued strengthening.  Patient has comprehensive HEP to  work on and is compliant with activities.   EVAL: Patient is a 68 y.o. female who was seen today for physical therapy evaluation and treatment for chronic LBP radiating into L hip. In addition, she has some foot pain/numbness (PT to further evaluate). She presents today with hypomobility throughout spine, L SI hypomobility, pain in L glut med region, pain along L sacral border, LE weakness. PT to address deficits to reduce pain, and improve tolerance to functional tasks.  GOALS: Goals reviewed with patient? Yes  SHORT TERM GOALS: Target date: 07/10/24  The patient will be indep with initial HEP Baseline: initiated at eval Goal status: MET   2.  The patient will report pain < or equal to 7/10 at worst. Baseline:  Can go up to 10/10 pain with prolonged standing Goal status: INITIAL  LONG TERM GOALS: Target date: 08/09/24  The patient will be indep with progression of HEP. Baseline:  initiated at eval Goal status: INITIAL  2.  The patient will improve modified oswestry by 10%  to demonstrate improved functional abilities. Baseline: 24% Goal status: INITIAL  3.  The patient will tolerate standing for 2-3 hours without reporting severe back pain (< than 3 point increase in pain with 2-3 hours). Baseline:  has to sit after 1.5-2 hours Goal status: INITIAL  4.  Improve hip and knee strength to 5/5 to demo improved functional strength for household activities. Baseline:  see above Goal status: INITIAL  5.  The patient will tolerate ROM lumbar spine without increased pain. Baseline:  see above Goal status: INITIAL  PLAN:  PT FREQUENCY: 2x/week  PT DURATION: 8 weeks  PLANNED INTERVENTIONS: 97164- PT Re-evaluation, 97750- Physical Performance Testing, 97110-Therapeutic exercises, 97530- Therapeutic activity, [id number]- Neuromuscular re-education, 97535- Self Care, 02859- Manual therapy, G0283- Electrical stimulation (unattended), 20560 (1-2 muscles), 20561 (3+ muscles)- Dry Needling,  Patient/Family education, Taping, Joint mobilization, Spinal mobilization, Cryotherapy, and Moist heat.  PLAN FOR NEXT SESSION: mobilization of sacral region, glut and HS strengthening, core engagement/strength, standing tolerance, yoga for potential long term loading/strengthening   Emerly Prak, PT 06/23/2024, 1:05 PM

## 2024-06-26 ENCOUNTER — Ambulatory Visit: Admitting: Rehabilitative and Restorative Service Providers"

## 2024-06-29 ENCOUNTER — Ambulatory Visit
Admission: RE | Admit: 2024-06-29 | Discharge: 2024-06-29 | Disposition: A | Discharge status: Home / Self Care | Source: Ambulatory Visit | Attending: Family Medicine | Admitting: Family Medicine

## 2024-06-29 VITALS — BP 129/82 | HR 66 | Temp 98.1°F | Resp 18 | Ht 67.0 in | Wt 135.0 lb

## 2024-06-29 DIAGNOSIS — R350 Frequency of micturition: Secondary | ICD-10-CM

## 2024-06-29 DIAGNOSIS — N390 Urinary tract infection, site not specified: Secondary | ICD-10-CM

## 2024-06-29 LAB — POCT URINE DIPSTICK
Bilirubin, UA: NEGATIVE
Blood, UA: NEGATIVE
Glucose, UA: NEGATIVE mg/dL
Ketones, POC UA: NEGATIVE mg/dL
Leukocytes, UA: NEGATIVE
Nitrite, UA: NEGATIVE
POC PROTEIN,UA: NEGATIVE
Spec Grav, UA: 1.01 (ref 1.010–1.025)
Urobilinogen, UA: 0.2 U/dL
pH, UA: 7 (ref 5.0–8.0)

## 2024-06-29 MED ORDER — NITROFURANTOIN MONOHYD MACRO 100 MG PO CAPS: 100.0000 mg | ORAL_CAPSULE | Freq: Two times a day (BID) | ORAL | 0 refills | Status: DC

## 2024-06-29 NOTE — ED Triage Notes (Signed)
 Patient c/o urinary urgency, frequency, nausea, body chills x 5 days.  Denies any hematuria or dysuria.  No OTC pain meds.

## 2024-06-29 NOTE — Discharge Instructions (Signed)
 Continue to drink lots of water Take the antibiotic 2 times a day Your urine has been sent for culture.  The result will be available on MyChart A nurse will call you if any change in therapy is needed Follow-up with your primary care doctor

## 2024-06-29 NOTE — ED Provider Notes (Signed)
 [facility]    CSN: [id number] Arrival date & time: 06/29/24  1113      History   Chief Complaint Chief Complaint  Patient presents with   Urinary Frequency    HPI Lynn Lloyd is a 68 y.o. female.   Patient has a history of frequent urinary tract infections.  She states that she feels like she has a urinary tract infection now.  She has frequency, urgency, and dysuria, lower abdominal pressure.  Has also had some fever and chills    Past Medical History:  Diagnosis Date   Chronic kidney disease    Constipation    Endometriosis    Hemorrhoid     Patient Active Problem List   Diagnosis Date Noted   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 pain and lower extremity pain 04/18/2024   Anxiety 04/16/2024   Osteopenia 10/29/2023   Pelvic pressure in female 09/19/2023   Family history of ovarian cancer 09/19/2023   Frequent UTI 09/19/2023   Chronic left-sided low back pain with left-sided sciatica 09/11/2022   Vitamin D  insufficiency 09/11/2022   Neck pain 09/11/2022   Radicular pain in left arm 09/11/2022   Left lower quadrant abdominal pain 12/26/2021   Skin tag 09/09/2021   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   Elevated LDL cholesterol level 08/27/2020   Lumbar spinal stenosis 08/25/2020   No energy 01/20/2018   Chronic idiopathic constipation 01/18/2018   Bronchospasm 11/15/2017   Allergic rhinitis 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 11/15/2017   Environmental and seasonal allergies 11/15/2017   Encounter for adjustment or management of vascular access device 09/13/2016   Post concussion syndrome 11/07/2013   FREQUENCY, URINARY 04/20/2010   PAIN IN JOINT, MULTIPLE SITES 09/28/2009   Tinnitus 07/13/2009   SOLAR KERATOSIS 03/31/2009   SEBORRHEIC KERATOSIS 08/12/2008   Benign neoplasm of skin 01/09/2008   POSTMENOPAUSAL STATUS 12/06/2007   Other malaise and fatigue 12/06/2007   Viral warts 09/09/2007   SPINAL STENOSIS, CERVICAL  09/09/2007   NEOPLASM, SKIN, UNCERTAIN BEHAVIOR 12/11/2006    Past Surgical History:  Procedure Laterality Date   ADENOIDECTOMY     BUNIONECTOMY  12/11   CHOLECYSTECTOMY     LAPAROSCOPY     endometriosis   SALIVARY GLAND SURGERY     removal   TONSILLECTOMY AND ADENOIDECTOMY      OB History     Gravida  3   Para  3   Term  3   Preterm      AB      Living  4      SAB      IAB      Ectopic      Multiple  1   Live Births               Home Medications    Prior to Admission medications   Medication Sig Start Date End Date Taking? Authorizing Provider  Bacillus Coagulans-Inulin (PROBIOTIC-PREBIOTIC) 1-250 BILLION-MG CAPS Take 1 capsule by mouth daily.   Yes [provider]  Biotin 1 MG CAPS Take 1 capsule by mouth daily.   Yes [provider]  cholecalciferol (VITAMIN D3) 25 MCG (1000 UNIT) tablet Take 1,000 Units by mouth daily.   Yes [provider]  conjugated estrogens (PREMARIN) vaginal cream Place 1 applicator vaginally once a week.   Yes [provider]  cyclobenzaprine  (FLEXERIL ) 10 MG tablet Take 1 tablet (10 mg total) by mouth 3 (three) times daily as needed for  muscle spasms. 06/04/24  Yes Hudnall, Ludie SAUNDERS, MD  hydrOXYzine  (ATARAX ) 10 MG tablet Take 1 tablet (10 mg total) by mouth 3 (three) times daily as needed for anxiety. 04/16/24  Yes Breeback, Jade L, PA-C  Magnesium Glycinate 100 MG CAPS Take 1 capsule by mouth daily.   Yes [provider]  nitrofurantoin , macrocrystal-monohydrate, (MACROBID ) 100 MG capsule Take 1 capsule (100 mg total) by mouth 2 (two) times daily. 06/29/24  Yes Maranda Jamee Jacob, MD  vitamin C (ASCORBIC ACID) 250 MG tablet Take 250 mg by mouth daily.   Yes [provider]  zinc gluconate 50 MG tablet Take 50 mg by mouth daily.   Yes [provider]    Family History Family History  Problem Relation Age of Onset   Cancer Mother        acl   Hypertension  Mother    Thyroid  disease Mother    Asthma Mother    COPD Mother    Allergic rhinitis Mother    Thyroid  disease Father    Parkinsonism Father    Colon cancer Maternal Aunt    Esophageal cancer Paternal Grandmother    Rectal cancer Neg Hx    Stomach cancer Neg Hx     Social History Social History   Tobacco Use   Smoking status: Never   Smokeless tobacco: Never  Vaping Use   Vaping status: Never Used  Substance Use Topics   Alcohol use: Yes    Comment: Occ wine   Drug use: No     Allergies   Gabapentin , Amoxicillin -pot clavulanate, Codeine, Gluten meal, Zyrtec [cetirizine], Cauliflower [brassica oleracea], Corn-containing products, Cow's milk  [milk (cow)], Latex, and Soy allergy (obsolete)   Review of Systems Review of Systems  See HPI Physical Exam Triage Vital Signs ED Triage Vitals  Encounter Vitals Group     BP 06/29/24 1124 129/82     Girls Systolic BP Percentile --      Girls Diastolic BP Percentile --      Boys Systolic BP Percentile --      Boys Diastolic BP Percentile --      Pulse Rate 06/29/24 1124 66     Resp 06/29/24 1124 18     Temp 06/29/24 1124 98.1 F (36.7 C)     Temp Source 06/29/24 1124 Oral     SpO2 06/29/24 1124 99 %     Weight 06/29/24 1123 135 lb (61.2 kg)     Height 06/29/24 1123 5' 7 (1.702 m)     Head Circumference --      Peak Flow --      Pain Score 06/29/24 1123 5     Pain Loc --      Pain Education --      Exclude from Growth Chart --    No data found.  Updated Vital Signs BP 129/82 (BP Location: Right Arm)   Pulse 66   Temp 98.1 F (36.7 C) (Oral)   Resp 18   Ht 5' 7 (1.702 m)   Wt 61.2 kg   SpO2 99%   BMI 21.14 kg/m      Physical Exam Constitutional:      General: She is not in acute distress.    Appearance: She is well-developed.  HENT:     Head: Normocephalic and atraumatic.  Eyes:     Conjunctiva/sclera: Conjunctivae normal.     Pupils: Pupils are equal, round, and reactive to light.   Cardiovascular:  Rate and Rhythm: Normal rate.  Pulmonary:     Effort: Pulmonary effort is normal. No respiratory distress.  Abdominal:     General: There is no distension.     Palpations: Abdomen is soft.     Tenderness: There is no right CVA tenderness or left CVA tenderness.  Musculoskeletal:        General: Normal range of motion.     Cervical back: Normal range of motion.  Skin:    General: Skin is warm and dry.  Neurological:     Mental Status: She is alert.      UC Treatments / Results  Labs (all labs ordered are listed, but only abnormal results are displayed) Labs Reviewed  POCT URINE DIPSTICK - Abnormal; Notable for the following components:      Result Value   Color, UA light yellow (*)    All other components within normal limits  URINE CULTURE    EKG   Radiology No results found.  Procedures Procedures (including critical care time)  Medications Ordered in UC Medications - No data to display  Initial Impression / Assessment and Plan / UC Course  I have reviewed the triage vital signs and the nursing notes.  Pertinent labs & imaging results that were available during my care of the patient were reviewed by me and considered in my medical decision making (see chart for details).     Will treat based on patient's symptoms rather than her urinalysis.  Urinalysis is completely normal.  I will send this for culture. Final Clinical Impressions(s) / UC Diagnoses   Final diagnoses:  Frequent UTI  Urinary frequency     Discharge Instructions      Continue to drink lots of water Take the antibiotic 2 times a day Your urine has been sent for culture.  The result will be available on MyChart A nurse will call you if any change in therapy is needed Follow-up with your primary care doctor   ED Prescriptions     Medication Sig Dispense Auth. Provider   nitrofurantoin , macrocrystal-monohydrate, (MACROBID ) 100 MG capsule Take 1 capsule (100 mg  total) by mouth 2 (two) times daily. 14 capsule Maranda Jamee Jacob, MD      PDMP not reviewed this encounter.   Maranda Jamee Jacob, MD 06/29/24 [phone]

## 2024-06-30 ENCOUNTER — Ambulatory Visit: Admitting: Rehabilitative and Restorative Service Providers"

## 2024-06-30 LAB — URINE CULTURE
Culture: NO GROWTH
Special Requests: NORMAL

## 2024-07-04 ENCOUNTER — Ambulatory Visit: Admitting: Rehabilitative and Restorative Service Providers"

## 2024-07-04 ENCOUNTER — Encounter: Payer: Self-pay | Admitting: Rehabilitative and Restorative Service Providers"

## 2024-07-04 DIAGNOSIS — R29898 Other symptoms and signs involving the musculoskeletal system: Secondary | ICD-10-CM

## 2024-07-04 DIAGNOSIS — M79605 Pain in left leg: Secondary | ICD-10-CM

## 2024-07-04 DIAGNOSIS — M6281 Muscle weakness (generalized): Secondary | ICD-10-CM

## 2024-07-04 DIAGNOSIS — M5459 Other low back pain: Secondary | ICD-10-CM

## 2024-07-04 NOTE — Therapy (Addendum)
 " OUTPATIENT PHYSICAL THERAPY THORACOLUMBAR TREATMENT AND ON HOLD NOTE/ DISCHARGE SUMMARY   Patient Name: Lynn Lloyd MRN: [id number] DOB:[birth year], 68 y.o., female Today's Date: 07/04/2024  PHYSICAL THERAPY DISCHARGE SUMMARY  Visits from Start of Care: 5  Current functional level related to goals / functional outcomes: See goals below   Remaining deficits: Recent onset urinary retention-   Education / Equipment: HEP   Patient agrees to discharge. Patient goals were partially met. Patient is being discharged due to meeting the stated rehab goals.  END OF SESSION:  PT End of Session - 07/04/24 0850     Visit Number 5    Number of Visits 16    Date for Recertification  08/09/24    Authorization Type [employer] - no auth required $50 copay    PT Start Time 0850    PT Stop Time 0935    PT Time Calculation (min) 45 min    Activity Tolerance Patient tolerated treatment well    Behavior During Therapy [facility] for tasks assessed/performed          Past Medical History:  Diagnosis Date   Chronic kidney disease    Constipation    Endometriosis    Hemorrhoid    Past Surgical History:  Procedure Laterality Date   ADENOIDECTOMY     BUNIONECTOMY  12/11   CHOLECYSTECTOMY     LAPAROSCOPY     endometriosis   SALIVARY GLAND SURGERY     removal   TONSILLECTOMY AND ADENOIDECTOMY     Patient Active Problem List   Diagnosis Date Noted   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 pain and lower extremity pain 04/18/2024   Anxiety 04/16/2024   Osteopenia 10/29/2023   Pelvic pressure in female 09/19/2023   Family history of ovarian cancer 09/19/2023   Frequent UTI 09/19/2023   Chronic left-sided low back pain with left-sided sciatica 09/11/2022   Vitamin D  insufficiency 09/11/2022   Neck pain 09/11/2022   Radicular pain in left arm 09/11/2022   Left lower quadrant abdominal pain 12/26/2021   Skin tag 09/09/2021   Post-COVID chronic  fatigue 02/08/2021   Elevated LDL cholesterol level 08/27/2020   Lumbar spinal stenosis 08/25/2020   No energy 01/20/2018   Chronic idiopathic constipation 01/18/2018   Bronchospasm 11/15/2017   Allergic rhinitis 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 11/15/2017   Environmental and seasonal allergies 11/15/2017   Encounter for adjustment or management of vascular access device 09/13/2016   Post concussion syndrome 11/07/2013   FREQUENCY, URINARY 04/20/2010   PAIN IN JOINT, MULTIPLE SITES 09/28/2009   Tinnitus 07/13/2009   SOLAR KERATOSIS 03/31/2009   SEBORRHEIC KERATOSIS 08/12/2008   Benign neoplasm of skin 01/09/2008   POSTMENOPAUSAL STATUS 12/06/2007   Other malaise and fatigue 12/06/2007   Viral warts 09/09/2007   SPINAL STENOSIS, CERVICAL 09/09/2007   NEOPLASM, SKIN, UNCERTAIN BEHAVIOR 12/11/2006    PCP: Vermell Bologna, PA=C REFERRING PROVIDER: Ludie Littler, MD REFERRING DIAG:  Diagnosis  [phone] (ICD-10-CM) - Spinal stenosis of lumbar region with neurogenic claudication   Rationale for Evaluation and Treatment: Rehabilitation  THERAPY DIAG:  Other low back pain  Pain in left leg  Other symptoms and signs involving the musculoskeletal system  Muscle weakness (generalized)  ONSET DATE: 06/04/24  SUBJECTIVE:  SUBJECTIVE STATEMENT: The patient did the strength training last week and felt good, did walking, and had noticed she could stand for longer without pain. She got a flare of urinary retention/pressure sensation and saw urgent care, but urine testing/culture was negative. She tried   EVAL: The patient has h/o intermittent back pain. She notes pain worsens when standing for >1.5-2 hours. Pain can be severe where she has to lay supine with legs supported. Hip flexor stretching tends to aggravate pain. She  notes in addition to back pain she also has L foot pain and some mild numbness in the R foot. She walks 3 miles/day, enjoys being active. She is doing cat/cow, bridges, bird dogs, plank, and pelvic tilts. She uses an inversion machine intermittent at home as well.   PERTINENT HISTORY:  Bunionectomy, h/o L3-L4 and L4-L5 slipped disc, h/o compression fx T12, LE paresthesias  PAIN:  Are you having pain? Yes: NPRS scale: 5/10 currently Pain location: bladder pressure,  Pain description: muscle spasms in low back and wraps around the front Aggravating factors: standing for longer periods Relieving factors: lays flat with feet supported  PRECAUTIONS: oesteopenia  WEIGHT BEARING RESTRICTIONS: No  FALLS:  Has patient fallen in last 6 months? No  PATIENT GOALS: more endurance for standing activities so she doesn't have to rest at 1.5 or 2 hours  OBJECTIVE:  Note: Objective measures were completed at Evaluation unless otherwise noted.  DIAGNOSTIC FINDINGS:  Xray 9/25 IMPRESSION: 1. Multilevel degenerative disc disease. 2. Grade 1 anterolisthesis of L4 on L5, unchanged. 3. Old T12 compression fracture.  PATIENT SURVEYS:  Modified Oswestry:  24%  COGNITION: Overall cognitive status: Within functional limits for tasks assessed     SENSATION: Chronic numbness L great toe web space, recent R foot numbness  MUSCLE LENGTH: Good HS length  POSTURE: No Significant postural limitations  PALPATION: Tender along lateral border of sacrum L side Tender L glut med Hypomobile throughout spine (not true spring test due to osteopenia) Hypomobility in SI region L  LUMBAR ROM:  AROM eval 07/04/24  Flexion WFLs WFLS  Extension WFLs, mild pain L glut med WFLS  Right lateral flexion WFLs, Increases L side pain WFLS  Left lateral flexion WFLs WFLS  Right rotation WFLs   Left rotation WFLs, Increased L side pain    (Blank rows = not tested)  LOWER EXTREMITY ROM:   WFLs  LOWER EXTREMITY  MMT:   MMT Right eval Left eval RIGHT 07/04/24 Left 07/04/24  Hip flexion 4/5 4/5 4+/5 5/5  Hip extension      Hip abduction      Hip adduction      Hip internal rotation 5/5 5/5 5/5 5/5  Hip external rotation 5/5 4/5 5/5 4+/5  Knee flexion 4/5 4/5 5/5 5/5  Knee extension 5/5 5/5 5/5 5/5  Ankle dorsiflexion 4/5 4/5 4/5 4+/5  Ankle plantarflexion      Ankle inversion      Ankle eversion       (Blank rows = not tested)  FUNCTIONAL TESTS:  Notes single leg stance/balance good-- can do tree for extended time  GAIT: Distance walked: 100 ft indep  [facility] Adult PT Treatment:                                                DATE: 07/04/24 Therapeutic Exercise: Prone hip flexor  stretch SIJ mobility Butterfly stretch Manual Therapy: Self release with massage ball inside ischial tuberosity and psoas Therapeutic Activity: Unilateral happy baby Bilateral happy baby position for pelvic floor release D/c squat due to flaring pelvic tension Discussed home routine and continuing yoga, HEP, and strength training to tolerance    [facility] Adult PT Treatment:                                                DATE: 06/23/24 Therapeutic Exercise: Discussed plan for HEP to have 5 strengthening exercises + stretching and foam rolling Reviewed patient's silver sneakers app and discussed potential exercise/strength classes to add to her routine Manual Therapy: Foam roller glut med and glut max with legs crossed to release one side at a time Therapeutic Activity: Quadriped primal push up x 5 reps with 10 second holds Quadriped bird dog x 5 reps R and L sides Extended plank to side plank x 3 reps R and L sides Side plank on elbows-- moved to extended for more challenge x 2 reps Sit<>stand squat to chair touch Plank on elbows x 2 reps   [facility] Adult PT Treatment:                                                DATE: 06/19/24 Neuromuscular re-ed: Quadriped cat/cow x 10 reps Single leg passing 5# front  to back R and L sides  Hip openers-- R and L -- L side feels like it gets stuck so discontinued Therapeutic Activity: Standing high lunges with counter rotation x 10 reps each direction Standing marching  with 5# kettle bell T reach with 5# kettle bell x 10 reps R and L sides  Single leg bridges x 10 reps R and L  Physioball roll in knee to chest for HS engagement Supine yoga    [facility] Adult PT Treatment:                                                DATE: 06/16/24 Therapeutic Exercise: Debby test stretch R and L Plank on knees (8 point) Plank on elbows x 2 reps Extended plank x 1 reps Prone hip extension x R and L sides Manual Therapy: Self soft tissue release with massage ball against wall Therapeutic Activity: Sidelying rocking with foam roller to mobilize SI joint Quadriped hip mobility  Supine bridge with yoga block with gentle rocking T stand at countertop 1/2 squats x 10 reps at [employer] Adult PT Treatment:                                                DATE: 06/10/24 Therapeutic Activity: Sidelying rocking with foam roller to mobilize SI joint Quadriped with hip mobilization using yoga block See HEP below  PATIENT EDUCATION:  Education details: HEP Person educated: Patient Education method: Programmer, Multimedia, Facilities Manager, and Handouts Education comprehension: verbalized understanding, returned demonstration, and needs further education  HOME EXERCISE PROGRAM: Access Code:  [email or web address] URL: [email or web address] Date: 06/23/2024 Prepared by: Tawni Ferrier  Exercises - Prone SIJ Anterior Rotation Mobilization on Table  - 1 x daily - 4 x weekly - 1 sets - 2 reps - 30 seconds hold - Sidelying Open Book Thoracic Rotation with Knee on Foam Roll  - 1 x daily - 4 x weekly - 1 sets - 1 reps - 2-4 minutes hold - Gluteus Mobilization with Foam Roll  - 1 x daily - 4 x weekly - 1 sets - 1 reps - 60 minutes hold - Figure 4 Gluteus Mobilization on Foam  Roll  - 1 x daily - 4 x weekly - 1 sets - 1 reps - Standard Plank  - 2 x daily - 3 x weekly - 1 sets - 10 reps - Side Plank with Full Arm Support  - 2 x daily - 3 x weekly - 1 sets - 10 reps - Bird Dog  - 2 x daily - 3 x weekly - 1 sets - 10 reps - Side Stepping with Resistance at Thighs  - 1 x daily - 3 x weekly - 2 sets - 10 reps - Forward T with Counter Support  - 1 x daily - 3 x weekly - 1 sets - 10 reps - Squat with Chair Touch  - 2 x daily - 3 x weekly - 1 sets - 10 reps  ASSESSMENT:  CLINICAL IMPRESSION: The patient has partially met STGs and LTGs. She arrives today with improved low back pain, however, is getting pelvic floor pressure, frequent urination and was cleared for urine screen. She feels that muscle activation can irritate the problem and notes pain after chair squat exercise. We d/c that exercise and did more focus pelvic floor release with home videos on external self massage, soft tissue massage with massage ball. We discussed symptoms-- she has some varying sensation of hypersensitivity and pressure in saddle region-- due to this she plans to reach out to MD re: MRI. She also plans to f/u with gynecologist to consider assessment and determine if pelvic floor training may be indicated. PT to place patient on hold while she works through current symptoms. She will call if further visits needed before end of plan of care on 12/20.  EVAL: Patient is a 68 y.o. female who was seen today for physical therapy evaluation and treatment for chronic LBP radiating into L hip. In addition, she has some foot pain/numbness (PT to further evaluate). She presents today with hypomobility throughout spine, L SI hypomobility, pain in L glut med region, pain along L sacral border, LE weakness. PT to address deficits to reduce pain, and improve tolerance to functional tasks.  GOALS: Goals reviewed with patient? Yes  SHORT TERM GOALS: Target date: 07/10/24  The patient will be indep with initial  HEP Baseline: initiated at eval Goal status: MET   2.  The patient will report pain < or equal to 7/10 at worst. Baseline:  Can go up to 10/10 pain with prolonged standing Goal status: MET  LONG TERM GOALS: Target date: 08/09/24  The patient will be indep with progression of HEP. Baseline:  initiated at eval Goal status: MET  2.  The patient will improve modified oswestry by 10%  to demonstrate improved functional abilities. Baseline: 24% Goal status: MET, 8% on 07/04/24  3.  The patient will tolerate standing for 2-3 hours without reporting severe back pain (< than 3 point increase in pain with 2-3 hours). Baseline:  has to sit  after 1.5-2 hours Goal status: PARTIALLY MET  4.  Improve hip and knee strength to 5/5 to demo improved functional strength for household activities. Baseline:  see above Goal status: PARTIALLY MET  5.  The patient will tolerate ROM lumbar spine without increased pain. Baseline:  see above Goal status: MET  PLAN:  PT FREQUENCY: 2x/week  PT DURATION: 8 weeks  PLANNED INTERVENTIONS: 97164- PT Re-evaluation, 97750- Physical Performance Testing, 97110-Therapeutic exercises, 97530- Therapeutic activity, [id number]- Neuromuscular re-education, 97535- Self Care, [id number]- Manual therapy, [id number]- Electrical stimulation (unattended), 20560 (1-2 muscles), 20561 (3+ muscles)- Dry Needling, Patient/Family education, Taping, Joint mobilization, Spinal mobilization, Cryotherapy, and Moist heat.  PLAN FOR NEXT SESSION:on hold-- to f/u with MD re: MRI and further pelvic floor assessment.   Cain Fitzhenry, PT 07/04/2024, 8:51 AM  "

## 2024-08-27 ENCOUNTER — Telehealth: Admitting: Emergency Medicine

## 2024-08-27 DIAGNOSIS — K1120 Sialoadenitis, unspecified: Secondary | ICD-10-CM | POA: Diagnosis not present

## 2024-08-27 MED ORDER — AMOXICILLIN-POT CLAVULANATE 875-125 MG PO TABS: 1.0000 | ORAL_TABLET | Freq: Two times a day (BID) | ORAL | 0 refills | Status: DC

## 2024-08-27 NOTE — Progress Notes (Signed)
 " Virtual Visit Consent   Lynn Lloyd, you are scheduled for a virtual visit with a [facility] provider today. Just as with appointments in the office, your consent must be obtained to participate. Your consent will be active for this visit and any virtual visit you may have with one of our providers in the next 365 days. If you have a MyChart account, a copy of this consent can be sent to you electronically.  As this is a virtual visit, video technology does not allow for your provider to perform a traditional examination. This may limit your provider's ability to fully assess your condition. If your provider identifies any concerns that need to be evaluated in person or the need to arrange testing (such as labs, EKG, etc.), we will make arrangements to do so. Although advances in technology are sophisticated, we cannot ensure that it will always work on either your end or our end. If the connection with a video visit is poor, the visit may have to be switched to a telephone visit. With either a video or telephone visit, we are not always able to ensure that we have a secure connection.  By engaging in this virtual visit, you consent to the provision of healthcare and authorize for your insurance to be billed (if applicable) for the services provided during this visit. Depending on your insurance coverage, you may receive a charge related to this service.  I need to obtain your verbal consent now. Are you willing to proceed with your visit today? Lynn Lloyd has provided verbal consent on 08/27/2024 for a virtual visit (video or telephone). Jon CHRISTELLA Belt, NP  Date: 08/27/2024 6:12 PM   Virtual Visit via Video Note   I, Jon CHRISTELLA Belt, connected with  Lynn Lloyd  ([id number], [birth year]) on 08/27/2024 at  5:15 PM EST by a video-enabled telemedicine application and verified that I am speaking with the correct person using two identifiers.  Location: Patient: Virtual Visit Location Patient:  Home Provider: Virtual Visit Location Provider: Home Office   I discussed the limitations of evaluation and management by telemedicine and the availability of in person appointments. The patient expressed understanding and agreed to proceed.    History of Present Illness: Lynn Lloyd is a 69 y.o. who identifies as a female who was assigned female at birth, and is being seen today for   Possible L salivary gland infection. 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 Has had several surgeries, most recently a few years ago when ENT flayed the gland open.   This morning, when palpating inside her mouth, this area discharged about a teaspoon of pus  Area started hurting last night, was just a little sore, and she feels a little tired. Does not feel very ill, no fever, no other sx.   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  Elevated LDL cholesterol level 08/27/2020   Lumbar spinal stenosis 08/25/2020   No energy 01/20/2018   Chronic idiopathic constipation 01/18/2018   Bronchospasm 11/15/2017   Allergic rhinitis 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 11/15/2017   Environmental and seasonal allergies 11/15/2017   Encounter for adjustment or management of vascular access device 09/13/2016   Post concussion syndrome 11/07/2013   FREQUENCY, URINARY 04/20/2010   PAIN IN JOINT, MULTIPLE  SITES 09/28/2009   Tinnitus 07/13/2009   SOLAR KERATOSIS 03/31/2009   SEBORRHEIC KERATOSIS 08/12/2008   Benign neoplasm of skin 01/09/2008   POSTMENOPAUSAL STATUS 12/06/2007   Other malaise and fatigue 12/06/2007   Viral warts 09/09/2007   SPINAL STENOSIS, CERVICAL 09/09/2007   NEOPLASM, SKIN, UNCERTAIN BEHAVIOR 12/11/2006    Allergies: Allergies[1] Medications: Current Medications[2]  Observations/Objective: Patient is well-developed, well-nourished in no acute distress.  Resting comfortably  at home.  Head is normocephalic, atraumatic.  No labored breathing.  Speech is clear and coherent with logical content.  Patient is alert and oriented at baseline.    Assessment and Plan: 1. Salivary gland infection (Primary) - amoxicillin -clavulanate (AUGMENTIN ) 875-125 MG tablet; Take 1 tablet by mouth 2 (two) times daily.  Dispense: 20 tablet; Refill: 0  Pt lists augmentin  as an allergy. Pt reports she has not had it in probably more than 30 years. She would like to try taking it again. Reaction was Feeling out of it but pt says that could have just been from parenting small children.   She will seek in person care if sx become severe.   She will stop the antibiotic and seek an alternative if she has an adverse reaction to it.   Follow Up Instructions: I discussed the assessment and treatment plan with the patient. The patient was provided an opportunity to ask questions and all were answered. The patient agreed with the plan and demonstrated an understanding of the instructions.  A copy of instructions were sent to the patient via MyChart unless otherwise noted below.   The patient was advised to call back or seek an in-person evaluation if the symptoms worsen or if the condition fails to improve as anticipated.    Jon CHRISTELLA Belt, NP     [1]  Allergies Allergen Reactions   Gabapentin  Other (See Comments)    Vision changes   Amoxicillin -Pot Clavulanate Other (See Comments)     get really spaced out pharmacist said was due to clavulanate   Codeine Nausea Only   Gluten Meal Other (See Comments)    Bloating   Zyrtec [Cetirizine]     Urinary retention/Frequent UTIs   Cauliflower [Brassica Oleracea] Other (See Comments)   Corn-Containing Products Other (See Comments)   Cow's Milk  [Milk (Cow)] Other (See Comments) and Cough   Latex Other (See Comments)    whelps   Soy Allergy (Obsolete) Other (See Comments)    Bloating  [2]  Current Outpatient Medications:    amoxicillin -clavulanate (AUGMENTIN ) 875-125 MG tablet, Take 1 tablet by mouth 2 (two) times daily., Disp: 20 tablet, Rfl: 0   Bacillus Coagulans-Inulin (PROBIOTIC-PREBIOTIC) 1-250 BILLION-MG CAPS, Take 1 capsule by mouth daily., Disp: , Rfl:    Biotin 1 MG CAPS, Take 1 capsule by mouth daily., Disp: , Rfl:    cholecalciferol (VITAMIN D3) 25 MCG (1000 UNIT) tablet, Take 1,000 Units by mouth daily., Disp: , Rfl:    conjugated estrogens (PREMARIN) vaginal cream, Place 1 applicator vaginally once a week., Disp: , Rfl:    cyclobenzaprine  (FLEXERIL ) 10 MG tablet, Take 1  tablet (10 mg total) by mouth 3 (three) times daily as needed for muscle spasms., Disp: 60 tablet, Rfl: 0   hydrOXYzine  (ATARAX ) 10 MG tablet, Take 1 tablet (10 mg total) by mouth 3 (three) times daily as needed for anxiety., Disp: 30 tablet, Rfl: 0   Magnesium Glycinate 100 MG CAPS, Take 1 capsule by mouth daily., Disp: , Rfl:    nitrofurantoin , macrocrystal-monohydrate, (MACROBID ) 100 MG capsule, Take 1 capsule (100 mg total) by mouth 2 (two) times daily., Disp: 14 capsule, Rfl: 0   vitamin C (ASCORBIC ACID) 250 MG tablet, Take 250 mg by mouth daily., Disp: , Rfl:    zinc gluconate 50 MG tablet, Take 50 mg by mouth daily., Disp: , Rfl:   "

## 2024-08-27 NOTE — Patient Instructions (Signed)
 " Lynn Lloyd, thank you for joining Lynn CHRISTELLA Belt, NP for today's virtual visit.  While this provider is not your primary care provider (PCP), if your PCP is located in our provider database this encounter information will be shared with them immediately following your visit.   A [facility] MyChart account gives you access to today's visit and all your visits, tests, and labs performed at [facility]  click here if you don't have a [facility] MyChart account or go to [email or web address]  Consent: (Patient) Lynn Lloyd provided verbal consent for this virtual visit at the beginning of the encounter.  Current Medications:  Current Outpatient Medications:    amoxicillin -clavulanate (AUGMENTIN ) 875-125 MG tablet, Take 1 tablet by mouth 2 (two) times daily., Disp: 20 tablet, Rfl: 0   Bacillus Coagulans-Inulin (PROBIOTIC-PREBIOTIC) 1-250 BILLION-MG CAPS, Take 1 capsule by mouth daily., Disp: , Rfl:    Biotin 1 MG CAPS, Take 1 capsule by mouth daily., Disp: , Rfl:    cholecalciferol (VITAMIN D3) 25 MCG (1000 UNIT) tablet, Take 1,000 Units by mouth daily., Disp: , Rfl:    conjugated estrogens (PREMARIN) vaginal cream, Place 1 applicator vaginally once a week., Disp: , Rfl:    cyclobenzaprine  (FLEXERIL ) 10 MG tablet, Take 1 tablet (10 mg total) by mouth 3 (three) times daily as needed for muscle spasms., Disp: 60 tablet, Rfl: 0   hydrOXYzine  (ATARAX ) 10 MG tablet, Take 1 tablet (10 mg total) by mouth 3 (three) times daily as needed for anxiety., Disp: 30 tablet, Rfl: 0   Magnesium Glycinate 100 MG CAPS, Take 1 capsule by mouth daily., Disp: , Rfl:    nitrofurantoin , macrocrystal-monohydrate, (MACROBID ) 100 MG capsule, Take 1 capsule (100 mg total) by mouth 2 (two) times daily., Disp: 14 capsule, Rfl: 0   vitamin C (ASCORBIC ACID) 250 MG tablet, Take 250 mg by mouth daily., Disp: , Rfl:    zinc gluconate 50 MG tablet, Take 50 mg by mouth daily., Disp: , Rfl:    Medications  ordered in this encounter:  Meds ordered this encounter  Medications   amoxicillin -clavulanate (AUGMENTIN ) 875-125 MG tablet    Sig: Take 1 tablet by mouth 2 (two) times daily.    Dispense:  20 tablet    Refill:  0     *If you need refills on other medications prior to your next appointment, please contact your pharmacy*  Follow-Up: Call back or seek an in-person evaluation if the symptoms worsen or if the condition fails to improve as anticipated.  [facility] [phone]  Other Instructions Talk with the pharmacist about taking the augmentin . If you need a different antibiotic, please call the Virtual Care number listed above and let us  know.   If you become more severely ill, please seek urgent/emergent in person care   If you have been instructed to have an in-person evaluation today at a local Urgent Care facility, please use the link below. It will take you to a list of all of our available [facility], including address, phone number and hours of operation. Please do not delay care.  [facility]  If you or a family member do not have a primary care provider, use the link below to schedule a visit and establish care. When you choose a [facility] primary care physician or advanced practice provider, you gain a long-term partner in health. Find a Primary Care Provider  Learn more about [facility]'s in-office and virtual  care options:  - Get Care Now  "

## 2024-09-23 ENCOUNTER — Encounter: Admitting: Physician Assistant

## 2024-09-24 ENCOUNTER — Encounter: Payer: Self-pay | Admitting: Physician Assistant

## 2024-09-24 ENCOUNTER — Ambulatory Visit: Admitting: Physician Assistant

## 2024-09-24 VITALS — BP 135/72 | HR 98 | Ht 67.0 in | Wt 142.0 lb

## 2024-09-24 DIAGNOSIS — R21 Rash and other nonspecific skin eruption: Secondary | ICD-10-CM

## 2024-09-24 MED ORDER — TRIAMCINOLONE ACETONIDE 0.1 % EX CREA: 1.0000 | TOPICAL_CREAM | Freq: Two times a day (BID) | CUTANEOUS | 0 refills | Status: AC

## 2024-09-24 NOTE — Patient Instructions (Signed)
 Skin Irritation (Contact Dermatitis): What to Know Dermatitis is when your skin becomes red, sore, and swollen.  Contact dermatitis happens when your body reacts to something that touches the skin. There are 2 types: Irritant contact dermatitis. This is when something bothers your skin, like soap. Allergic contact dermatitis. This is when your skin touches something you are allergic to, like poison ivy. What are the causes? Irritant contact dermatitis may be caused by: Makeup. Soaps. Detergents. Bleaches. Acids. Metals, like nickel. Allergic contact dermatitis may be caused by: Plants. Chemicals. Jewelry. Latex. Medicines. Preservatives. These are things added to products to help them last longer. There may be some in your clothes. What increases the risk? Having a job where you have to be near things that bother your skin. Having asthma or eczema. What are the signs or symptoms?  Dry or flaky skin. Redness. Cracks. Itching. Moderate symptoms of this condition include: Pain or a burning feeling. Blisters. Blood or clear fluid coming from cracks in your skin. Swelling. This may be on your eyelids, mouth, or genitals. How is this treated? Your doctor will find out what is making your skin react. Then, you can protect your skin. You may need to use: Steroid creams, ointments, or medicines. Antibiotics or other ointments, if you have a skin infection. Lotion or medicines to help with itching. A bandage. Follow these instructions at home: Skin care Put moisturizer on your skin when it needs it. Put cool, wet cloths on your skin (cool compresses). Put a baking soda paste on your skin. Stir water into baking soda until it looks like a paste. Do not scratch your skin. Try not to have things rub up against your skin. Avoid tight clothing. Avoid using soaps, perfumes, and dyes. Check your skin every day for signs of infection. Check for: More redness, swelling, or pain. More  fluid or blood. Warmth. Pus or a bad smell. Medicines Take or apply over-the-counter and prescription medicines only as told by your doctor. If you were prescribed antibiotics, take or apply them as told by your doctor. Do not stop using them even if you start to feel better. Bathing Take a bath with: Epsom salts. Baking soda. Colloidal oatmeal. Bathe less often. Bathe in warm water. Try not to use hot water. Bandage care If you were given a bandage, change it as told by your doctor. Wash your hands with soap and water for at least 20 seconds before and after you change your bandage. If you cannot use soap and water, use hand sanitizer. General instructions Avoid the things that caused your reaction. If you don't know what caused it, keep a journal. Write down: What you eat. What skin products you use. What you drink. What you wear. Contact a doctor if: You do not get better with treatment. You get worse. You have signs of infection. You have a fever. You have new symptoms. Your bone or joint near the area hurts after the skin has healed. Get help right away if: You see red streaks coming from the area. The area turns darker. You have trouble breathing. This information is not intended to replace advice given to you by your health care provider. Make sure you discuss any questions you have with your health care provider. Document Revised: 06/13/2024 Document Reviewed: 02/10/2022 Elsevier Patient Education  2025 [employer].

## 2024-09-24 NOTE — Progress Notes (Signed)
" ° °  Acute Office Visit  Subjective:     Patient ID: Lynn Lloyd, female    DOB: Jan 29, 1956, 69 y.o.   MRN: [id number]  Chief Complaint  Patient presents with   Rash    On stomach onset Monday    HPI .Discussed the use of AI scribe software for clinical note transcription with the patient, who gave verbal consent to proceed.  History of Present Illness Lynn Lloyd is a 69 year old female who presents with a rash over abdomen and back following a recent cruise.  Widespread pruritic rash - Onset of rash with pruritus began on Monday night, September 22, 2024, after returning from a 12-night cruise that ended September 16, 2024 - Rash is described as 'everywhere,' primarily affecting trunk and upper legs - Lesions are rough and filled with liquid - New rash developed on the morning of September 24, 2024 - No topical treatments applied to the rash - No use of clothes from the cruise since returning home - No new lotions, creams, scents - She used scopolamine  patches while on cruise  Associated symptoms - No fever or chills - Felt achy for a couple of days after returning home - Bloating present despite healthy eating for the past week - Normal bowel movements  Exposure history - Cousin who was on the cruise also developed a similar rash - Possible exposures considered include shore excursions or a bite received while in a car - No new medications, detergents, or lotions used  Immunization status - Received shingles vaccination    ROS See HPI.     Objective:    BP 135/72   Pulse 98   Ht 5' 7 (1.702 m)   Wt 142 lb (64.4 kg)   SpO2 100%   BMI 22.24 kg/m  BP Readings from Last 3 Encounters:  09/24/24 135/72  06/29/24 129/82  06/04/24 110/82   Wt Readings from Last 3 Encounters:  09/24/24 142 lb (64.4 kg)  06/29/24 135 lb (61.2 kg)  06/04/24 135 lb (61.2 kg)      Physical Exam Constitutional:      Appearance: Normal appearance.  Cardiovascular:     Rate  and Rhythm: Normal rate and regular rhythm.  Pulmonary:     Effort: Pulmonary effort is normal.  Skin:    Comments: Maculopapular rash over trunk.   Neurological:     Mental Status: She is alert.  Psychiatric:        Mood and Affect: Mood normal.          Assessment & Plan:  .Lynn Lloyd was seen today for rash.  Diagnoses and all orders for this visit:  Rash and nonspecific skin eruption -     triamcinolone  cream (KENALOG ) 0.1 %; Apply 1 Application topically 2 (two) times Lloyd.   Assessment & Plan Contact dermatitis Widespread rash with itching on trunk and legs post-cruise. Likely contact dermatitis from environmental exposure. No fever or systemic symptoms. Rash spreading with excoriation. No shingles or viral rash signs. - Prescribed topical steroid with moisturizer for affected areas. - Advised washing all cruise clothing to remove irritants. - Recommended non-scented, non-aggravating skin care products. - Instructed to monitor and report new or worsening symptoms.     Govani Radloff, PA-C   "

## 2024-12-09 ENCOUNTER — Encounter: Admitting: Physician Assistant
